# Patient Record
Sex: Male | Born: 1960
Health system: Southern US, Community
[De-identification: ages and names within clinical notes are randomized; demographics above are authoritative.]

## PROBLEM LIST (undated history)

## (undated) DIAGNOSIS — M1990 Unspecified osteoarthritis, unspecified site: Secondary | ICD-10-CM

## (undated) DIAGNOSIS — E663 Overweight: Secondary | ICD-10-CM

## (undated) DIAGNOSIS — D179 Benign lipomatous neoplasm, unspecified: Secondary | ICD-10-CM

## (undated) DIAGNOSIS — F1020 Alcohol dependence, uncomplicated: Secondary | ICD-10-CM

## (undated) DIAGNOSIS — R7303 Prediabetes: Secondary | ICD-10-CM

## (undated) DIAGNOSIS — J449 Chronic obstructive pulmonary disease, unspecified: Secondary | ICD-10-CM

## (undated) DIAGNOSIS — K649 Unspecified hemorrhoids: Secondary | ICD-10-CM

## (undated) DIAGNOSIS — F1490 Cocaine use, unspecified, uncomplicated: Secondary | ICD-10-CM

## (undated) DIAGNOSIS — K219 Gastro-esophageal reflux disease without esophagitis: Secondary | ICD-10-CM

## (undated) DIAGNOSIS — F17210 Nicotine dependence, cigarettes, uncomplicated: Secondary | ICD-10-CM

## (undated) DIAGNOSIS — I700 Atherosclerosis of aorta: Secondary | ICD-10-CM

## (undated) DIAGNOSIS — K862 Cyst of pancreas: Secondary | ICD-10-CM

## (undated) DIAGNOSIS — R918 Other nonspecific abnormal finding of lung field: Secondary | ICD-10-CM

## (undated) DIAGNOSIS — K7689 Other specified diseases of liver: Secondary | ICD-10-CM

## (undated) DIAGNOSIS — I10 Essential (primary) hypertension: Secondary | ICD-10-CM

## (undated) DIAGNOSIS — J189 Pneumonia, unspecified organism: Secondary | ICD-10-CM

## (undated) DIAGNOSIS — I2510 Atherosclerotic heart disease of native coronary artery without angina pectoris: Secondary | ICD-10-CM

## (undated) HISTORY — DX: Essential (primary) hypertension: I10

## (undated) HISTORY — DX: Benign lipomatous neoplasm, unspecified: D17.9

## (undated) HISTORY — DX: Prediabetes: R73.03

## (undated) HISTORY — DX: Cyst of pancreas: K86.2

## (undated) HISTORY — DX: Overweight: E66.3

## (undated) HISTORY — DX: Unspecified hemorrhoids: K64.9

## (undated) HISTORY — DX: Nicotine dependence, cigarettes, uncomplicated: F17.210

## (undated) HISTORY — DX: Other nonspecific abnormal finding of lung field: R91.8

## (undated) HISTORY — DX: Atherosclerotic heart disease of native coronary artery without angina pectoris: I25.10

## (undated) HISTORY — PX: COLONOSCOPY: SHX174

## (undated) HISTORY — DX: Unspecified osteoarthritis, unspecified site: M19.90

## (undated) HISTORY — DX: Atherosclerosis of aorta: I70.0

## (undated) HISTORY — DX: Cocaine use, unspecified, uncomplicated: F14.90

## (undated) HISTORY — DX: Alcohol dependence, uncomplicated: F10.20

## (undated) HISTORY — DX: Other specified diseases of liver: K76.89

## (undated) HISTORY — PX: JOINT REPLACEMENT: SHX530

---

## 2009-11-24 HISTORY — PX: APPENDECTOMY: SHX54

## 2011-12-08 DIAGNOSIS — G8929 Other chronic pain: Secondary | ICD-10-CM | POA: Diagnosis not present

## 2011-12-08 DIAGNOSIS — M79609 Pain in unspecified limb: Secondary | ICD-10-CM | POA: Diagnosis not present

## 2011-12-08 DIAGNOSIS — I10 Essential (primary) hypertension: Secondary | ICD-10-CM | POA: Diagnosis not present

## 2012-01-23 DIAGNOSIS — R51 Headache: Secondary | ICD-10-CM | POA: Diagnosis not present

## 2012-01-23 DIAGNOSIS — J3489 Other specified disorders of nose and nasal sinuses: Secondary | ICD-10-CM | POA: Diagnosis not present

## 2012-01-23 DIAGNOSIS — M542 Cervicalgia: Secondary | ICD-10-CM | POA: Diagnosis not present

## 2012-01-23 DIAGNOSIS — S199XXA Unspecified injury of neck, initial encounter: Secondary | ICD-10-CM | POA: Diagnosis not present

## 2012-01-23 DIAGNOSIS — R42 Dizziness and giddiness: Secondary | ICD-10-CM | POA: Diagnosis not present

## 2012-01-23 DIAGNOSIS — R079 Chest pain, unspecified: Secondary | ICD-10-CM | POA: Diagnosis not present

## 2012-01-23 DIAGNOSIS — S0990XA Unspecified injury of head, initial encounter: Secondary | ICD-10-CM | POA: Diagnosis not present

## 2012-01-23 DIAGNOSIS — S0993XA Unspecified injury of face, initial encounter: Secondary | ICD-10-CM | POA: Diagnosis not present

## 2012-08-27 DIAGNOSIS — R079 Chest pain, unspecified: Secondary | ICD-10-CM | POA: Diagnosis not present

## 2012-08-27 DIAGNOSIS — K625 Hemorrhage of anus and rectum: Secondary | ICD-10-CM | POA: Diagnosis not present

## 2012-08-27 DIAGNOSIS — R109 Unspecified abdominal pain: Secondary | ICD-10-CM | POA: Diagnosis not present

## 2012-08-27 DIAGNOSIS — I700 Atherosclerosis of aorta: Secondary | ICD-10-CM | POA: Diagnosis not present

## 2012-08-27 DIAGNOSIS — I4949 Other premature depolarization: Secondary | ICD-10-CM | POA: Diagnosis not present

## 2012-08-27 DIAGNOSIS — R0789 Other chest pain: Secondary | ICD-10-CM | POA: Diagnosis not present

## 2012-08-27 DIAGNOSIS — R1013 Epigastric pain: Secondary | ICD-10-CM | POA: Diagnosis not present

## 2012-08-27 DIAGNOSIS — K921 Melena: Secondary | ICD-10-CM | POA: Diagnosis not present

## 2012-08-28 DIAGNOSIS — K625 Hemorrhage of anus and rectum: Secondary | ICD-10-CM | POA: Diagnosis not present

## 2012-09-01 DIAGNOSIS — K219 Gastro-esophageal reflux disease without esophagitis: Secondary | ICD-10-CM | POA: Diagnosis not present

## 2012-09-01 DIAGNOSIS — F17200 Nicotine dependence, unspecified, uncomplicated: Secondary | ICD-10-CM | POA: Diagnosis not present

## 2012-09-01 DIAGNOSIS — I10 Essential (primary) hypertension: Secondary | ICD-10-CM | POA: Diagnosis not present

## 2012-09-01 DIAGNOSIS — R1013 Epigastric pain: Secondary | ICD-10-CM | POA: Diagnosis not present

## 2012-09-08 DIAGNOSIS — D129 Benign neoplasm of anus and anal canal: Secondary | ICD-10-CM | POA: Diagnosis not present

## 2012-09-08 DIAGNOSIS — Z1211 Encounter for screening for malignant neoplasm of colon: Secondary | ICD-10-CM | POA: Diagnosis not present

## 2012-09-08 DIAGNOSIS — D128 Benign neoplasm of rectum: Secondary | ICD-10-CM | POA: Diagnosis not present

## 2012-09-08 DIAGNOSIS — K319 Disease of stomach and duodenum, unspecified: Secondary | ICD-10-CM | POA: Diagnosis not present

## 2014-12-05 ENCOUNTER — Encounter: Payer: Self-pay | Admitting: Internal Medicine

## 2014-12-05 DIAGNOSIS — K279 Peptic ulcer, site unspecified, unspecified as acute or chronic, without hemorrhage or perforation: Secondary | ICD-10-CM | POA: Diagnosis not present

## 2014-12-07 DIAGNOSIS — I10 Essential (primary) hypertension: Secondary | ICD-10-CM | POA: Diagnosis not present

## 2014-12-13 ENCOUNTER — Encounter: Payer: Self-pay | Admitting: *Deleted

## 2014-12-27 ENCOUNTER — Ambulatory Visit: Payer: Self-pay | Admitting: Nurse Practitioner

## 2014-12-29 ENCOUNTER — Emergency Department (HOSPITAL_COMMUNITY): Payer: Medicare Other

## 2014-12-29 ENCOUNTER — Encounter (HOSPITAL_COMMUNITY): Payer: Self-pay | Admitting: *Deleted

## 2014-12-29 ENCOUNTER — Emergency Department (HOSPITAL_COMMUNITY)
Admission: EM | Admit: 2014-12-29 | Discharge: 2014-12-29 | Disposition: A | Discharge status: Home / Self Care | Payer: Medicare Other | Attending: Emergency Medicine | Admitting: Emergency Medicine

## 2014-12-29 DIAGNOSIS — R109 Unspecified abdominal pain: Secondary | ICD-10-CM | POA: Insufficient documentation

## 2014-12-29 DIAGNOSIS — Z79899 Other long term (current) drug therapy: Secondary | ICD-10-CM | POA: Insufficient documentation

## 2014-12-29 DIAGNOSIS — R531 Weakness: Secondary | ICD-10-CM | POA: Insufficient documentation

## 2014-12-29 DIAGNOSIS — I10 Essential (primary) hypertension: Secondary | ICD-10-CM | POA: Insufficient documentation

## 2014-12-29 DIAGNOSIS — K7689 Other specified diseases of liver: Secondary | ICD-10-CM | POA: Diagnosis not present

## 2014-12-29 DIAGNOSIS — R0602 Shortness of breath: Secondary | ICD-10-CM | POA: Diagnosis not present

## 2014-12-29 DIAGNOSIS — R197 Diarrhea, unspecified: Secondary | ICD-10-CM | POA: Diagnosis not present

## 2014-12-29 DIAGNOSIS — Z720 Tobacco use: Secondary | ICD-10-CM | POA: Insufficient documentation

## 2014-12-29 DIAGNOSIS — M2550 Pain in unspecified joint: Secondary | ICD-10-CM | POA: Diagnosis present

## 2014-12-29 DIAGNOSIS — K921 Melena: Secondary | ICD-10-CM | POA: Diagnosis not present

## 2014-12-29 DIAGNOSIS — K868 Other specified diseases of pancreas: Secondary | ICD-10-CM | POA: Diagnosis not present

## 2014-12-29 LAB — BASIC METABOLIC PANEL
Anion gap: 4 — ABNORMAL LOW (ref 5–15)
BUN: 11 mg/dL (ref 6–23)
CALCIUM: 9.3 mg/dL (ref 8.4–10.5)
CO2: 29 mmol/L (ref 19–32)
Chloride: 104 mmol/L (ref 96–112)
Creatinine, Ser: 0.89 mg/dL (ref 0.50–1.35)
GFR calc non Af Amer: >90 mL/min (ref >90)
GLUCOSE: 105 mg/dL — AB (ref 70–99)
Potassium: 4.1 mmol/L (ref 3.5–5.1)
Sodium: 137 mmol/L (ref 135–145)

## 2014-12-29 LAB — HEPATIC FUNCTION PANEL
ALT: 27 U/L (ref 0–53)
AST: 38 U/L — ABNORMAL HIGH (ref 0–37)
Albumin: 4 g/dL (ref 3.5–5.2)
Alkaline Phosphatase: 62 U/L (ref 39–117)
Bilirubin, Direct: 0.1 mg/dL (ref 0.0–0.5)
Indirect Bilirubin: 0.6 mg/dL (ref 0.3–0.9)
TOTAL PROTEIN: 7 g/dL (ref 6.0–8.3)
Total Bilirubin: 0.7 mg/dL (ref 0.3–1.2)

## 2014-12-29 LAB — CBC WITH DIFFERENTIAL/PLATELET
BASOS PCT: 0 % (ref 0–1)
Basophils Absolute: 0 10*3/uL (ref 0.0–0.1)
Eosinophils Absolute: 0.1 10*3/uL (ref 0.0–0.7)
Eosinophils Relative: 1 % (ref 0–5)
HCT: 40.3 % (ref 39.0–52.0)
HEMOGLOBIN: 13.3 g/dL (ref 13.0–17.0)
LYMPHS PCT: 30 % (ref 12–46)
Lymphs Abs: 2 10*3/uL (ref 0.7–4.0)
MCH: 31.5 pg (ref 26.0–34.0)
MCHC: 33 g/dL (ref 30.0–36.0)
MCV: 95.5 fL (ref 78.0–100.0)
MONOS PCT: 10 % (ref 3–12)
Monocytes Absolute: 0.7 10*3/uL (ref 0.1–1.0)
Neutro Abs: 3.8 10*3/uL (ref 1.7–7.7)
Neutrophils Relative %: 58 % (ref 43–77)
PLATELETS: 285 10*3/uL (ref 150–400)
RBC: 4.22 MIL/uL (ref 4.22–5.81)
RDW: 14 % (ref 11.5–15.5)
WBC: 6.4 10*3/uL (ref 4.0–10.5)

## 2014-12-29 LAB — LIPASE, BLOOD: Lipase: 26 U/L (ref 11–59)

## 2014-12-29 MED ORDER — PANTOPRAZOLE SODIUM 40 MG IV SOLR
40.0000 mg | Freq: Once | INTRAVENOUS | Status: AC
  Administered 2014-12-29: 40 mg via INTRAVENOUS
  Filled 2014-12-29: qty 40

## 2014-12-29 MED ORDER — HYDROMORPHONE HCL 1 MG/ML IJ SOLN
1.0000 mg | Freq: Once | INTRAMUSCULAR | Status: AC
  Administered 2014-12-29: 1 mg via INTRAVENOUS
  Filled 2014-12-29: qty 1

## 2014-12-29 MED ORDER — SODIUM CHLORIDE 0.9 % IV BOLUS (SEPSIS)
1000.0000 mL | Freq: Once | INTRAVENOUS | Status: AC
  Administered 2014-12-29: 1000 mL via INTRAVENOUS

## 2014-12-29 MED ORDER — SODIUM CHLORIDE 0.9 % IV SOLN: INTRAVENOUS | Status: DC

## 2014-12-29 MED ORDER — SODIUM CHLORIDE 0.9 % IJ SOLN
INTRAMUSCULAR | Status: AC
  Filled 2014-12-29: qty 45

## 2014-12-29 MED ORDER — IOHEXOL 300 MG/ML  SOLN
100.0000 mL | Freq: Once | INTRAMUSCULAR | Status: AC | PRN
  Administered 2014-12-29: 100 mL via INTRAVENOUS

## 2014-12-29 MED ORDER — HYDROCODONE-ACETAMINOPHEN 5-325 MG PO TABS: 1.0000 | ORAL_TABLET | Freq: Four times a day (QID) | ORAL | Status: DC | PRN

## 2014-12-29 MED ORDER — IOHEXOL 300 MG/ML  SOLN
25.0000 mL | Freq: Once | INTRAMUSCULAR | Status: AC | PRN
  Administered 2014-12-29: 25 mL via ORAL

## 2014-12-29 MED ORDER — ONDANSETRON HCL 4 MG/2ML IJ SOLN
4.0000 mg | Freq: Once | INTRAMUSCULAR | Status: AC
  Administered 2014-12-29: 4 mg via INTRAVENOUS
  Filled 2014-12-29: qty 2

## 2014-12-29 NOTE — ED Notes (Signed)
MD at bedside. 

## 2014-12-29 NOTE — ED Notes (Addendum)
Pt states he woke up 2 days ago with joint pain mostly to right side of body, states pain to almost entire right side of the body. Pt also states bloody stools at times, which he would like to be checked for also. Last noticed bloody stool last Tuesday. Denies abdominal pain. States scratchy throat x 1 week.

## 2014-12-29 NOTE — Discharge Instructions (Signed)
Based on the CT findings that we discussed. With concerns for possible pancreatic mass or liver mass, although these may be nothing of significance, it is important that she get prompt follow-up. Call your primary care doctor and get an appointment sooner than later. Contact GI medicine for evaluation. They may recommend MRI around the liver and the pancreas. Also for the blood in the bowel movements colonoscopy would be appropriate. Also with rest of workup is negative upper endoscopy to rule out the stomach ulcer would also be appropriate. Take pain medicine as directed. Work note provided. Return for new or worse symptoms.

## 2014-12-29 NOTE — ED Provider Notes (Signed)
CSN: [id number]     Arrival date & time 12/29/14  0753 History   First MD Initiated Contact with Patient 12/29/14 1305     Chief Complaint  Patient presents with  . Joint Pain     (Consider location/radiation/quality/duration/timing/severity/associated sxs/prior Treatment) The history is provided by the patient.   patient here for several complaints. Patient woke 2 days ago with joint pain mostly right side of the body. Also with some mild upper respiratory symptoms. Patient has had abdominal pain for 2 weeks. Seen in urgent care started on Prilosec over-the-counter. Patient's also had episodes of the bloody bowel movements. Had bowel movement since then the last bloody one was Tuesday. Nursing stated that he he denied abdominal pain. Was the first thing he told me about. Patient states abdominal pain is 8 out of 10 nonradiating not associated with nausea or vomiting.     Past Medical History  Diagnosis Date  . Hypertension    History reviewed. No pertinent past surgical history. Family History  Problem Relation Age of Onset  . Arthritis Mother   . Cancer Mother   . Diabetes Mother   . Hypertension Mother   . Arthritis Sister   . Cancer Sister   . COPD Brother   . Arthritis Sister   . Arthritis Sister   . Arthritis Sister    History  Substance Use Topics  . Smoking status: Current Every Day Smoker  . Smokeless tobacco: Never Used  . Alcohol Use: Yes    Review of Systems  Constitutional: Positive for fatigue. Negative for fever.  HENT: Positive for congestion and sore throat. Negative for trouble swallowing and voice change.   Eyes: Negative for redness.  Respiratory: Negative for shortness of breath.   Cardiovascular: Negative for chest pain and leg swelling.  Gastrointestinal: Positive for abdominal pain and blood in stool. Negative for nausea, vomiting and diarrhea.  Genitourinary: Negative for dysuria.  Musculoskeletal: Positive for myalgias. Negative for neck  stiffness.  Skin: Negative for rash.  Neurological: Positive for weakness. Negative for speech difficulty, numbness and headaches.  Hematological: Does not bruise/bleed easily.  Psychiatric/Behavioral: Negative for confusion.      Allergies  Review of patient's allergies indicates no known allergies.  Home Medications   Prior to Admission medications   Medication Sig Start Date End Date Taking? Authorizing Provider  acetaminophen (TYLENOL) 500 MG tablet Take 1,000 mg by mouth every 6 (six) hours as needed for moderate pain.   Yes Historical Provider, MD  lisinopril (PRINIVIL,ZESTRIL) 20 MG tablet Take 20 mg by mouth daily.   Yes Historical Provider, MD  omeprazole (PRILOSEC OTC) 20 MG tablet Take 20 mg by mouth daily.   Yes Historical Provider, MD   BP 165/99 mmHg  Pulse 50  Temp(Src) 97.9 F (36.6 C) (Oral)  Resp 16  Ht 5\' 8"  (1.727 m)  Wt 179 lb (81.194 kg)  BMI 27.22 kg/m2  SpO2 98% Physical Exam  Constitutional: He is oriented to person, place, and time. He appears well-developed and well-nourished. No distress.  HENT:  Head: Normocephalic and atraumatic.  Mouth/Throat: Oropharynx is clear and moist. No oropharyngeal exudate.  Eyes: Conjunctivae and EOM are normal. Pupils are equal, round, and reactive to light.  Neck: Normal range of motion.  Cardiovascular: Normal rate, regular rhythm and normal heart sounds.   No murmur heard. Pulmonary/Chest: Effort normal and breath sounds normal. No respiratory distress.  Abdominal: Soft. Bowel sounds are normal. He exhibits no mass. There is tenderness. There is  no guarding.  Musculoskeletal: Normal range of motion. He exhibits no edema.  Neurological: He is alert and oriented to person, place, and time. No cranial nerve deficit. He exhibits normal muscle tone. Coordination normal.  Skin: Skin is warm. No rash noted.  Nursing note and vitals reviewed.   ED Course  Procedures (including critical care time) Labs Review Labs  Reviewed  BASIC METABOLIC PANEL - Abnormal; Notable for the following:    Glucose, Bld 105 (*)    Anion gap 4 (*)    All other components within normal limits  HEPATIC FUNCTION PANEL - Abnormal; Notable for the following:    AST 38 (*)    All other components within normal limits  CBC WITH DIFFERENTIAL/PLATELET  LIPASE, BLOOD   Results for orders placed or performed during the hospital encounter of 16/01/09  Basic metabolic panel  Result Value Ref Range   Sodium 137 135 - 145 mmol/L   Potassium 4.1 3.5 - 5.1 mmol/L   Chloride 104 96 - 112 mmol/L   CO2 29 19 - 32 mmol/L   Glucose, Bld 105 (H) 70 - 99 mg/dL   BUN 11 6 - 23 mg/dL   Creatinine, Ser 0.89 0.50 - 1.35 mg/dL   Calcium 9.3 8.4 - 10.5 mg/dL   GFR calc non Af Amer >90 >90 mL/min   GFR calc Af Amer >90 >90 mL/min   Anion gap 4 (L) 5 - 15  CBC with Differential  Result Value Ref Range   WBC 6.4 4.0 - 10.5 K/uL   RBC 4.22 4.22 - 5.81 MIL/uL   Hemoglobin 13.3 13.0 - 17.0 g/dL   HCT 40.3 39.0 - 52.0 %   MCV 95.5 78.0 - 100.0 fL   MCH 31.5 26.0 - 34.0 pg   MCHC 33.0 30.0 - 36.0 g/dL   RDW 14.0 11.5 - 15.5 %   Platelets 285 150 - 400 K/uL   Neutrophils Relative % 58 43 - 77 %   Neutro Abs 3.8 1.7 - 7.7 K/uL   Lymphocytes Relative 30 12 - 46 %   Lymphs Abs 2.0 0.7 - 4.0 K/uL   Monocytes Relative 10 3 - 12 %   Monocytes Absolute 0.7 0.1 - 1.0 K/uL   Eosinophils Relative 1 0 - 5 %   Eosinophils Absolute 0.1 0.0 - 0.7 K/uL   Basophils Relative 0 0 - 1 %   Basophils Absolute 0.0 0.0 - 0.1 K/uL  Lipase, blood  Result Value Ref Range   Lipase 26 11 - 59 U/L  Hepatic function panel  Result Value Ref Range   Total Protein 7.0 6.0 - 8.3 g/dL   Albumin 4.0 3.5 - 5.2 g/dL   AST 38 (H) 0 - 37 U/L   ALT 27 0 - 53 U/L   Alkaline Phosphatase 62 39 - 117 U/L   Total Bilirubin 0.7 0.3 - 1.2 mg/dL   Bilirubin, Direct 0.1 0.0 - 0.5 mg/dL   Indirect Bilirubin 0.6 0.3 - 0.9 mg/dL     Imaging Review Dg Chest 2 View  12/29/2014    CLINICAL DATA:  Right-sided pain from the shoulder down. Shortness of breath. Abdominal pain.  EXAM: CHEST  2 VIEW  COMPARISON:  None.  FINDINGS: The heart size and mediastinal contours are within normal limits. Both lungs are clear. The visualized skeletal structures are unremarkable.  IMPRESSION: No active cardiopulmonary disease.   Electronically Signed   By: Kathreen Devoid   On: 12/29/2014 14:46   Ct Abdomen  Pelvis W Contrast  12/29/2014   CLINICAL DATA:  Right-sided abdominal pain, diarrhea  EXAM: CT ABDOMEN AND PELVIS WITH CONTRAST  TECHNIQUE: Multidetector CT imaging of the abdomen and pelvis was performed using the standard protocol following bolus administration of intravenous contrast.  CONTRAST:  44mL OMNIPAQUE IOHEXOL 300 MG/ML SOLN, 157mL OMNIPAQUE IOHEXOL 300 MG/ML SOLN  COMPARISON:  None.  FINDINGS: Lower chest: Minimal dependent bibasilar atelectasis is present. No pleural effusion.  Hepatobiliary: A homogeneously enhancing 1.8 cm oval mass is identified in the posterior segment right hepatic lobe image 40 series 2. At delayed imaging, there is apparent deenhancement. No other hepatic mass is identified. No intrahepatic ductal dilatation. Gallbladder is normal.  Pancreas: Within the body of the pancreas, there is a 5 mm hypodense lesion not in apparent immediate contiguity with the main pancreatic duct, image 25 series 2. No peripancreatic fluid collection is identified and the pancreas is otherwise homogeneously enhancing. No enhancing mass lesion is identified.  Spleen: Normal  Adrenals/Urinary Tract: Adrenal glands are normal. Kidneys are normal.  Stomach/Bowel: Right lower quadrant clips most likely indicate appendectomy. Stomach is normal in appearance. No bowel wall thickening or focal segmental dilatation is identified.  Vascular/Lymphatic: No lymphadenopathy. Moderate atheromatous aortic calcification is identified without aneurysm. A few small retroperitoneal nodes are identified measuring  5 mm and smaller in short axis.  Other: Small fat containing bilateral inguinal hernias. No acute osseous abnormality.  Musculoskeletal: No acute osseous abnormality.  IMPRESSION: No acute intra-abdominal or pelvic pathology.  Enhancing posterior segment right hepatic lobe mass with subsequent de enhancement, which could indicate hemangioma however solitary metastatic lesion could have a similar appearance. Further differentiation is recommended with abdominal MRI with contrast, preferably hepatocyte specific imaging contrast agent Eovist.  5 mm low-density pancreatic body mass. If the patient has had a history of pancreatitis, this could represent sequela of previous inflammation, a congenital cyst, or less likely cystadenoma. This is also amenable to further evaluation at the time of hepatic MRI for differentiation from malignancy.  These results will be called to the ordering clinician or representative by the Radiologist Assistant, and communication documented in the PACS or zVision Dashboard.   Electronically Signed   By: Conchita Paris M.D.   On: 12/29/2014 15:23     EKG Interpretation None      MDM   Final diagnoses:  Abdominal pain  Weakness  Blood in stool    Patient with abdominal pain predominantly epigastric pain for several weeks. Patient seen in urgent care and started on Prilosec OTC. Patient with history of hypertension. Do not take his hypertensive meds this morning but did take them here and blood pressure is showing some signs of improvement. Patient has a new doctor Dr. Buelah Manis.  Lab workup without any significant abnormalities no evidence of anemia no leukocytosis or function test are normal no elevated lipase nothing consistent with pancreatitis. Patient also had some blood in his bowel movements over the past week. Sometimes he states a large amount but not with every bowel movement. None today. No evidence of significant blood loss.  CT scan of the abdomen is pending if  negative patient to be discharged home with follow-up with GI medicine and his regular doctor for the blood in the bowel movements it would be appropriate to arrange a colonoscopy. For the epigastric abdominal pain of consideration for upper endoscopy would be appropriate as well.   For the patient's generalized fatigue and weakness and pain on the left side lab workup  chest x-ray without any significant findings to explain that. Patient's vital signs no fever no tachycardia.   Addendum: CT scan raises concerns for possible pancreatic mass or liver mass. Follow-up with GI medicine very important based on this finding. Will give patient referral. Will treat the pain. Body ache cause is not clear may just very well be related to a viral type illness. Patient also would be important to follow-up with Dr. Buelah Manis sooner than later.  Fredia Sorrow, MD 12/29/14 1539

## 2014-12-29 NOTE — ED Notes (Signed)
Pt states he did not take his BP medication today.  

## 2015-01-01 ENCOUNTER — Other Ambulatory Visit: Payer: Self-pay

## 2015-01-02 ENCOUNTER — Encounter: Payer: Self-pay | Admitting: Nurse Practitioner

## 2015-01-02 ENCOUNTER — Other Ambulatory Visit: Payer: Self-pay

## 2015-01-02 ENCOUNTER — Ambulatory Visit (INDEPENDENT_AMBULATORY_CARE_PROVIDER_SITE_OTHER): Payer: Medicare Other | Admitting: Nurse Practitioner

## 2015-01-02 VITALS — BP 138/91 | HR 74 | Temp 97.6°F | Ht 68.0 in | Wt 158.4 lb

## 2015-01-02 DIAGNOSIS — R634 Abnormal weight loss: Secondary | ICD-10-CM

## 2015-01-02 DIAGNOSIS — K869 Disease of pancreas, unspecified: Secondary | ICD-10-CM | POA: Diagnosis not present

## 2015-01-02 DIAGNOSIS — R109 Unspecified abdominal pain: Secondary | ICD-10-CM

## 2015-01-02 DIAGNOSIS — R5383 Other fatigue: Secondary | ICD-10-CM | POA: Diagnosis not present

## 2015-01-02 DIAGNOSIS — K625 Hemorrhage of anus and rectum: Secondary | ICD-10-CM

## 2015-01-02 DIAGNOSIS — K8689 Other specified diseases of pancreas: Secondary | ICD-10-CM | POA: Insufficient documentation

## 2015-01-02 DIAGNOSIS — R160 Hepatomegaly, not elsewhere classified: Secondary | ICD-10-CM

## 2015-01-02 DIAGNOSIS — K769 Liver disease, unspecified: Secondary | ICD-10-CM | POA: Diagnosis not present

## 2015-01-02 NOTE — Assessment & Plan Note (Signed)
Intermittent rectal bleeding over the past year. In the past 2-3 weeks rectal bleeding has become more frequent and large. Also with epigastric and RUQ/RLQ abdominal pain, weight loss (per patient and current record as much as 18 pounds in the past month), fatigue, decreased appetite. History of alcohol abuse (currently drinks up to a pint a day, but will sometimes go a week without any ETOH). Was seen in the ER a few days ago with essentially normal labs. CT demonstrated both right hepatic lobe mass and pancreatic body mass concerning for possible carcinoma, recommend MRI follow-up. Will check labs: CBC, BMP, hepatic function panel, lipase. Will order MRI abdomen with contrast for hepatic and pancreatic masses. Will plan for colonoscopy +/- EGD with Dr. Gala Romney in the OR with propofol (due to alcohol/drug use history). Will need drug test prior to procedure for history of cocaine use. Will call with results and make additional plan from there. Return in 2 weeks for follow-up.  Proceed with TCS +/- EGD in the OR with propofol with Dr. Gala Romney in near future: the risks, benefits, and alternatives have been discussed with the patient in detail. The patient states understanding and desires to proceed.  Will need drug test prior to procedure (history of cocaine use.)

## 2015-01-02 NOTE — Progress Notes (Signed)
Primary Care Physician:  Vic Blackbird, MD Primary Gastroenterologist:  Dr. Gala Romney  Chief Complaint  Patient presents with  . Rectal Bleeding  . Fatigue    HPI:   54 year old male presents for ER follow-up. Presented to the ER 12/29/14 with complaints of abdominal pain x 2 weeks. Was seen in urgent care and given Prilosec OTC which has not helped. Also c/o bloody bowel movements over the past week which he states was large in volume, but not with every bowel movement. Also c/o generalized weakness and fatigue. Labs in the ER were done, CBC normal, BMP normal, lipase normal. Hepatic panel showed minimal elevation of AST (38). CT abdomen found 1.8 cm oval mass in posterior segment of the right hepatic lobe, could indicate hemangioma however solitary metastatic lesion could have a similar appearance; 5 mm hypodense lesion within the body of the pancreas without peripancreatic fluid collection which, if the patient has a history of pancreatitis, could represent sequela of previous inflammation, a congenital cyst, or less likely cystadenoma. Radiologist recommended follow-up abdominal MRI with contrast for both issues. Chest XRay with no acute process.  Today the patient states he's been very weak, which started about 2-3 weeks ago but has gotten progressively worse. The rectal bleeding has been occurring on and off for about a year, but has become more consistent in the past week. Had a colonoscopy about 10-15 years ago (per patient) which was done in [place], [place]. Per the patient he had an intestinal infection but no polyps or masses. Abdominal pain is pan right-sided which he though was attributable to arthritis. Denies N/V. Her previous noraml was 1-3 bowel movements a day which were a bit on the loose side. Now he is having less frequent bowel movements and looser. Noted hematochezia, which he states is a large amount, on the tissue, the stool, and in the water. Bleeding has been more frequent since  leaving the ER, last episode today. Has been having fever/chills for the past 3 days. Has had no appetite for the past 1-2 weeks. States a couple weeks ago was 176 lb, today his weight is 158 lb in the office. Also admits some lightheadedness over the past week or two.  Past Medical History  Diagnosis Date  . Hypertension     No past surgical history on file.  Current Outpatient Prescriptions  Medication Sig Dispense Refill  . HYDROcodone-acetaminophen (NORCO/VICODIN) 5-325 MG per tablet Take 1-2 tablets by mouth every 6 (six) hours as needed. 20 tablet 0  . lisinopril (PRINIVIL,ZESTRIL) 20 MG tablet Take 20 mg by mouth daily.    Marland Kitchen omeprazole (PRILOSEC OTC) 20 MG tablet Take 20 mg by mouth daily.    Marland Kitchen acetaminophen (TYLENOL) 500 MG tablet Take 1,000 mg by mouth every 6 (six) hours as needed for moderate pain.     No current facility-administered medications for this visit.    Allergies as of 01/02/2015  . (No Known Allergies)    Family History  Problem Relation Age of Onset  . Arthritis Mother   . Cancer Mother   . Diabetes Mother   . Hypertension Mother   . Arthritis Sister   . Cancer Sister   . COPD Brother   . Arthritis Sister   . Arthritis Sister   . Arthritis Sister     History   Social History  . Marital Status: Single    Spouse Name: N/A    Number of Children: N/A  . Years of Education:  N/A   Occupational History  . Not on file.   Social History Main Topics  . Smoking status: Current Every Day Smoker  . Smokeless tobacco: Never Used  . Alcohol Use: Yes  . Drug Use: No  . Sexual Activity: Yes   Other Topics Concern  . Not on file   Social History Narrative    Review of Systems: Gen: Denies any fever, chills, fatigue, weight loss, lack of appetite.  CV: Denies chest pain, heart palpitations, peripheral edema.  Resp: Denies shortness of breath at rest, occasional shortness of breath on exertion. Denies wheezing.  GI: See HPI. Admits rare dysphagia  about once a month, denies odynophagia. Denies jaundice, hematemesis. MS: Admits joint pain per history of arthritis. Admits muscle weakness per HPI. Denies cramps, or limitation of movement.  Derm: Denies rash, itching, dry skin Psych: Denies depression, anxiety, memory loss, and confusion Heme: Denies bruising, bleeding, and enlarged lymph nodes.  Physical Exam: BP 138/91 mmHg  Pulse 74  Temp(Src) 97.6 F (36.4 C) (Oral)  Ht 5\' 8"  (1.727 m)  Wt 158 lb 6.4 oz (71.85 kg)  BMI 24.09 kg/m2 General:   Appears very fatigued but non-toxic. Sleepy but oriented. Pleasant and cooperative. Well-nourished and well-developed.  Head:  Normocephalic and atraumatic. Eyes:  Without icterus, sclera clear and conjunctiva pink.  Ears:  Normal auditory acuity. Mouth:  No deformity or lesions, oral mucosa pink.  Neck:  Supple, without mass or thyromegaly. Lungs:  Clear to auscultation bilaterally. No wheezes, rales, or rhonchi. No distress.  Heart:  S1, S2 present without murmurs appreciated.  Abdomen:  +BS, soft, non-distended. Moderate to severe TTP of epigastric and RLQ/RUQ. Liver border appreciated 3-4 fingerbreadths below the costal margin. No splenomegaly noted. Some guarding to palpation, no rebound. No masses appreciated.  Rectal:  Deferred  Msk:  Symmetrical without gross deformities. Normal posture. Pulses:  Normal pulses noted. Extremities:  Without clubbing or edema. Neurologic:  Alert and  oriented x4;  grossly normal neurologically. Skin:  Intact without significant lesions or rashes. Cervical Nodes:  No significant cervical adenopathy. Psych:  Alert and cooperative. Normal mood and affect.     01/02/2015 2:29 PM

## 2015-01-02 NOTE — Patient Instructions (Signed)
1. We will send in the order for the MRI and the nurse will discuss how/when to have this done. 2. Please have your labs drawn roday 3. We will have you sign a records release to try and get your previous colonoscopy records from [facility] in [place], [place] 4. We will schedule your procedure for you (colonoscopy with a possible endoscopy at the same time.) 5. Work on cutting back/quitting both smoking and alcohol use. 6. We will call you when we have results back.

## 2015-01-02 NOTE — Addendum Note (Signed)
Addended by: Marlou Porch on: 01/02/2015 03:26 PM   Modules accepted: Orders

## 2015-01-02 NOTE — Progress Notes (Signed)
cc'ed to pcp °

## 2015-01-03 ENCOUNTER — Other Ambulatory Visit (HOSPITAL_COMMUNITY): Payer: Medicare Other

## 2015-01-03 ENCOUNTER — Ambulatory Visit (HOSPITAL_COMMUNITY)
Admission: RE | Admit: 2015-01-03 | Discharge: 2015-01-03 | Disposition: A | Discharge status: Home / Self Care | Payer: Medicare Other | Source: Ambulatory Visit | Attending: Nurse Practitioner | Admitting: Nurse Practitioner

## 2015-01-03 ENCOUNTER — Other Ambulatory Visit: Payer: Self-pay

## 2015-01-03 ENCOUNTER — Ambulatory Visit (HOSPITAL_COMMUNITY): Admission: RE | Admit: 2015-01-03 | Payer: Medicare Other | Source: Ambulatory Visit

## 2015-01-03 DIAGNOSIS — K7689 Other specified diseases of liver: Secondary | ICD-10-CM | POA: Diagnosis not present

## 2015-01-03 DIAGNOSIS — R109 Unspecified abdominal pain: Secondary | ICD-10-CM | POA: Insufficient documentation

## 2015-01-03 DIAGNOSIS — K8689 Other specified diseases of pancreas: Secondary | ICD-10-CM

## 2015-01-03 DIAGNOSIS — R932 Abnormal findings on diagnostic imaging of liver and biliary tract: Secondary | ICD-10-CM | POA: Diagnosis not present

## 2015-01-03 LAB — CBC WITH DIFFERENTIAL/PLATELET
BASOS ABS: 0 10*3/uL (ref 0.0–0.1)
BASOS PCT: 0 % (ref 0–1)
EOS ABS: 0.1 10*3/uL (ref 0.0–0.7)
EOS PCT: 1 % (ref 0–5)
HCT: 44.7 % (ref 39.0–52.0)
HEMOGLOBIN: 14.8 g/dL (ref 13.0–17.0)
Lymphocytes Relative: 32 % (ref 12–46)
Lymphs Abs: 2.1 10*3/uL (ref 0.7–4.0)
MCH: 31.1 pg (ref 26.0–34.0)
MCHC: 33.1 g/dL (ref 30.0–36.0)
MCV: 93.9 fL (ref 78.0–100.0)
MONO ABS: 0.5 10*3/uL (ref 0.1–1.0)
MPV: 9.8 fL (ref 8.6–12.4)
Monocytes Relative: 8 % (ref 3–12)
NEUTROS ABS: 3.8 10*3/uL (ref 1.7–7.7)
NEUTROS PCT: 59 % (ref 43–77)
Platelets: 304 10*3/uL (ref 150–400)
RBC: 4.76 MIL/uL (ref 4.22–5.81)
RDW: 14.3 % (ref 11.5–15.5)
WBC: 6.5 10*3/uL (ref 4.0–10.5)

## 2015-01-03 LAB — LIPASE: Lipase: 31 U/L (ref 0–75)

## 2015-01-03 LAB — BASIC METABOLIC PANEL
BUN: 13 mg/dL (ref 6–23)
CHLORIDE: 101 meq/L (ref 96–112)
CO2: 28 mEq/L (ref 19–32)
Calcium: 10 mg/dL (ref 8.4–10.5)
Creat: 0.91 mg/dL (ref 0.50–1.35)
GLUCOSE: 86 mg/dL (ref 70–99)
POTASSIUM: 4.9 meq/L (ref 3.5–5.3)
SODIUM: 137 meq/L (ref 135–145)

## 2015-01-03 LAB — HEPATIC FUNCTION PANEL
ALK PHOS: 67 U/L (ref 39–117)
ALT: 20 U/L (ref 0–53)
AST: 24 U/L (ref 0–37)
Albumin: 4.4 g/dL (ref 3.5–5.2)
Bilirubin, Direct: 0.1 mg/dL (ref 0.0–0.3)
Indirect Bilirubin: 0.6 mg/dL (ref 0.2–1.2)
TOTAL PROTEIN: 7.3 g/dL (ref 6.0–8.3)
Total Bilirubin: 0.7 mg/dL (ref 0.2–1.2)

## 2015-01-03 MED ORDER — GADOXETATE DISODIUM 0.25 MMOL/ML IV SOLN
8.0000 mL | Freq: Once | INTRAVENOUS | Status: AC | PRN
  Administered 2015-01-03: 8 mL via INTRAVENOUS

## 2015-01-04 ENCOUNTER — Ambulatory Visit: Payer: Medicare Other | Admitting: Gastroenterology

## 2015-01-08 ENCOUNTER — Ambulatory Visit: Payer: Medicare Other | Admitting: Family Medicine

## 2015-01-09 ENCOUNTER — Other Ambulatory Visit: Payer: Self-pay

## 2015-01-09 ENCOUNTER — Ambulatory Visit (INDEPENDENT_AMBULATORY_CARE_PROVIDER_SITE_OTHER): Payer: Medicare Other | Admitting: Family Medicine

## 2015-01-09 ENCOUNTER — Encounter: Payer: Self-pay | Admitting: Family Medicine

## 2015-01-09 ENCOUNTER — Telehealth: Payer: Self-pay | Admitting: Nurse Practitioner

## 2015-01-09 VITALS — BP 148/88 | HR 72 | Temp 98.2°F | Resp 16 | Ht 69.0 in | Wt 161.0 lb

## 2015-01-09 DIAGNOSIS — R634 Abnormal weight loss: Secondary | ICD-10-CM

## 2015-01-09 DIAGNOSIS — Z125 Encounter for screening for malignant neoplasm of prostate: Secondary | ICD-10-CM

## 2015-01-09 DIAGNOSIS — R5383 Other fatigue: Secondary | ICD-10-CM | POA: Diagnosis not present

## 2015-01-09 DIAGNOSIS — Z720 Tobacco use: Secondary | ICD-10-CM

## 2015-01-09 DIAGNOSIS — F1090 Alcohol use, unspecified, uncomplicated: Secondary | ICD-10-CM

## 2015-01-09 DIAGNOSIS — K2970 Gastritis, unspecified, without bleeding: Secondary | ICD-10-CM | POA: Diagnosis not present

## 2015-01-09 DIAGNOSIS — Z789 Other specified health status: Secondary | ICD-10-CM

## 2015-01-09 DIAGNOSIS — F17200 Nicotine dependence, unspecified, uncomplicated: Secondary | ICD-10-CM

## 2015-01-09 DIAGNOSIS — Z23 Encounter for immunization: Secondary | ICD-10-CM

## 2015-01-09 DIAGNOSIS — I10 Essential (primary) hypertension: Secondary | ICD-10-CM

## 2015-01-09 DIAGNOSIS — M25571 Pain in right ankle and joints of right foot: Secondary | ICD-10-CM

## 2015-01-09 DIAGNOSIS — Z0000 Encounter for general adult medical examination without abnormal findings: Secondary | ICD-10-CM

## 2015-01-09 DIAGNOSIS — G8929 Other chronic pain: Secondary | ICD-10-CM

## 2015-01-09 DIAGNOSIS — M25579 Pain in unspecified ankle and joints of unspecified foot: Secondary | ICD-10-CM

## 2015-01-09 MED ORDER — PEG 3350-KCL-NA BICARB-NACL 420 G PO SOLR: 4000.0000 mL | Freq: Once | ORAL | Status: DC

## 2015-01-09 MED ORDER — PANTOPRAZOLE SODIUM 40 MG PO TBEC: 40.0000 mg | DELAYED_RELEASE_TABLET | Freq: Every day | ORAL | Status: DC

## 2015-01-09 MED ORDER — LISINOPRIL 20 MG PO TABS: 40.0000 mg | ORAL_TABLET | Freq: Every day | ORAL | Status: DC

## 2015-01-09 NOTE — Assessment & Plan Note (Signed)
Pneumonia vaccine given 

## 2015-01-09 NOTE — Progress Notes (Signed)
Patient ID: David Curtis, male   DOB: [birth year], 54 y.o.   MRN: [id number]   Subjective:    Patient ID: David Curtis, male    DOB: [birth year], 54 y.o.   MRN: [id number]  Patient presents for New Patient CPE  patient here for a new patient physical exam. He does not have a primary doctor. He moved here from [place] 2 years ago. He has history of chronic ankle pain and joint pain he was followed by orthopedist Dr. Clayton Lefort in [place] who had him on chronic Percocet per report. He was also seen by gastroenterologist in [place]. He's been having difficulties with his bowels as well as his stomach for quite some time. He is currently on omeprazole over-the-counter which is not helping. He was seen by gastroenterology here locally who are planning for endoscopy and colonoscopy due to some blood in the stools. It was also noted that he went to the ER because of the abdominal pain as well as weakness there was concern for pancreatic mass and liver mass however MRI was done and I see the results today these were benign cyst.  He has history of hypertension he was out of this medication for the past 2 years until recently when the ER gave him a refill on lisinopril. He has no history of any heart disease or stroke per report. He does have history of polysubstance abuse including alcohol which he typically drinks about upon her prednisone half a day he also has used cocaine and marijuana regularly he states that he lasted both of these 30 days ago. He was incarcerated some years ago as well. He denies ever using any IV drugs. He is here today with his girlfriend.  Subjective:   Patient presents for Medicare Annual/Subsequent preventive examination.   Review Past Medical/Family/Social: per EMR   Risk Factors  Current exercise habits: None Dietary issues discussed: None  Cardiac risk factors: HTN  Depression Screen  (Note: if answer to either of the following is "Yes", a more complete  depression screening is indicated)  Over the past two weeks, have you felt down, depressed or hopeless? No Over the past two weeks, have you felt little interest or pleasure in doing things? No Have you lost interest or pleasure in daily life? No Do you often feel hopeless? No Do you cry easily over simple problems? No   Activities of Daily Living  In your present state of health, do you have any difficulty performing the following activities?:  Driving? No  Managing money? No  Feeding yourself? No  Getting from bed to chair? No  Climbing a flight of stairs? No  Preparing food and eating?: No  Bathing or showering? No  Getting dressed: No  Getting to the toilet? No  Using the toilet:No  Moving around from place to place: No  In the past year have you fallen or had a near fall?:No  Are you sexually active? Yes Do you have more than one partner? No   Hearing Difficulties: No  Do you often ask people to speak up or repeat themselves? No  Do you experience ringing or noises in your ears? No Do you have difficulty understanding soft or whispered voices? No  Do you feel that you have a problem with memory? No Do you often misplace items? No  Do you feel safe at home? Yes  Cognitive Testing  Alert? Yes Normal Appearance?Yes  Oriented to person? Yes Place? Yes  Time? Yes  Recall of three  objects? Yes  Can perform simple calculations? Yes  Displays appropriate judgment?Yes  Can read the correct time from a watch face?Yes   List the Names of Other Physician/Practitioners you currently use: Dr. Gala Romney    Screening Tests / Date -No recent immunizations, including pneumonia vaccine   Colonoscopy   - Needs records               Influenza Vaccine  Tetanus/tdap    Assessment:    Annual wellness medicare exam   Plan:    During the course of the visit the patient was educated and counseled about appropriate screening and preventive services including:  Colorectal cancer  screening  Pneumonia vaccine 23 given- smoker   Screen  Neg for depression.   Diet review for nutrition referral? Yes ____ Not Indicated __x__  Patient Instructions (the written plan) was given to the patient.  Medicare Attestation  I have personally reviewed:  The patient's medical and social history  Their use of alcohol, tobacco or illicit drugs  Their current medications and supplements  The patient's functional ability including ADLs,fall risks, home safety risks, cognitive, and hearing and visual impairment  Diet and physical activities  Evidence for depression or mood disorders  The patient's weight, height, BMI, and visual acuity have been recorded in the chart. I have made referrals, counseling, and provided education to the patient based on review of the above and I have provided the patient with a written personalized care plan for preventive services.       Review Of Systems:  GEN- denies fatigue, fever, weight loss,weakness, recent illness HEENT- denies eye drainage, change in vision, nasal discharge, CVS- denies chest pain, palpitations RESP- denies SOB, cough, wheeze ABD- denies N/V, change in stools, +abd pain GU- denies dysuria, hematuria, dribbling, incontinence MSK- + joint pain, muscle aches, injury Neuro- denies headache, dizziness, syncope, seizure activity       Objective:    BP 148/88 mmHg  Pulse 72  Temp(Src) 98.2 F (36.8 C) (Oral)  Resp 16  Ht 5\' 9"  (1.753 m)  Wt 161 lb (73.029 kg)  BMI 23.76 kg/m2 GEN- NAD, alert and oriented x3, thin, HEENT- PERRL, EOMI, non injected sclera, pink conjunctiva, MMM, oropharynx clear Neck- Supple, no thyromegaly CVS- RRR, no murmur RESP-CTAB ABD-NABS,soft,TTP lower quadrants,ND Psych- normal affect and mood,  EXT- No edema Pulses- Radial, DP- 2+        Assessment & Plan:      Problem List Items Addressed This Visit      Unprioritized   Tobacco use disorder   Loss of weight   Relevant Orders    RPR   HIV antibody (with reflex)   Heavy alcohol use   Gastritis - Primary   Fatigue   Relevant Orders   TSH   Essential hypertension   Relevant Medications   lisinopril (PRINIVIL,ZESTRIL) tablet   Other Relevant Orders   Lipid panel    Other Visit Diagnoses    Routine general medical examination at a health care facility        Prostate cancer screening        Relevant Orders    PSA, Medicare    Need for prophylactic vaccination against Streptococcus pneumoniae (pneumococcus)        Relevant Orders    Pneumococcal polysaccharide vaccine 23-valent greater than or equal to 2yo subcutaneous/IM (Completed)       Note: This dictation was prepared with Dragon dictation along with smaller phrase technology. Any transcriptional errors that  result from this process are unintentional.

## 2015-01-09 NOTE — Assessment & Plan Note (Signed)
He states that he was treated for some type of stomach infection the past I symptoms may been H pylori gastroenterology is getting all of his records. I will change him to Protonix 40 mg once a day and he will continue to follow-up get gastroenterology

## 2015-01-09 NOTE — Patient Instructions (Signed)
[facility] [facility] Increase lisinopril to 2 tablet New stomach medication Dexilant Return in 1 week for blood pressure recheck and lab review F/U1 week

## 2015-01-09 NOTE — Assessment & Plan Note (Signed)
Concerned about his ongoing weight loss. He states his typical weight is around 178-180 pounds. I will check some other labs on him today including a thyroid as well as an HIV antibody

## 2015-01-09 NOTE — Assessment & Plan Note (Signed)
Chronic ankle pain stemming from a Worker's Compensation injury back in 1998. I'm concerned about putting him on chronic pain medications because of his substance abuse. I did not give him any prescriptions today I will obtain his records from his previous orthopedist he may need to be sent to a pain clinic

## 2015-01-09 NOTE — Assessment & Plan Note (Signed)
Increase lisinopril to 40 mg follow-up in one week

## 2015-01-09 NOTE — Telephone Encounter (Signed)
Labs normal and MRI both lesions appear benign. Please place on recall list for repeat MRI in 1 year. Also, the plan was for a colonoscopy +/- EGD but it doesn't appear scheduled yet (likely waiting for lab/MRI results). Can we check with the patient and try and schedule it?  Thanks!

## 2015-01-09 NOTE — Assessment & Plan Note (Signed)
He has a heavy alcohol use as well as history of substance abuse. Discussed to him the importance of staying away from these substances. He has quit alcohol completely but this is when he was incarcerated

## 2015-01-10 LAB — LIPID PANEL
CHOL/HDL RATIO: 3.7 ratio
CHOLESTEROL: 223 mg/dL — AB (ref 0–200)
HDL: 60 mg/dL (ref >39)
LDL Cholesterol: 130 mg/dL — ABNORMAL HIGH (ref 0–99)
TRIGLYCERIDES: 166 mg/dL — AB (ref <150)
VLDL: 33 mg/dL (ref 0–40)

## 2015-01-10 LAB — PSA, MEDICARE: PSA: 0.43 ng/mL (ref <=4.00)

## 2015-01-10 LAB — HIV ANTIBODY (ROUTINE TESTING W REFLEX): HIV: NONREACTIVE

## 2015-01-10 LAB — RPR

## 2015-01-10 LAB — TSH: TSH: 1.817 u[IU]/mL (ref 0.350–4.500)

## 2015-01-10 NOTE — Telephone Encounter (Signed)
Pt is scheduled for 01/25/15 @ 11:00 for his TCS+/-EGD. Instructions are in the mail

## 2015-01-18 ENCOUNTER — Telehealth: Payer: Self-pay | Admitting: *Deleted

## 2015-01-18 NOTE — Telephone Encounter (Signed)
Received request from pharmacy for PA on Protonix.   PA to be faxed to office.   Dx: K29.70- Gastritis.

## 2015-01-19 ENCOUNTER — Ambulatory Visit: Payer: Medicare Other | Admitting: Nurse Practitioner

## 2015-01-19 ENCOUNTER — Telehealth: Payer: Self-pay | Admitting: Family Medicine

## 2015-01-19 MED ORDER — DEXLANSOPRAZOLE 60 MG PO CPDR: 60.0000 mg | DELAYED_RELEASE_CAPSULE | Freq: Every day | ORAL | Status: DC

## 2015-01-19 MED ORDER — LISINOPRIL 20 MG PO TABS: 40.0000 mg | ORAL_TABLET | Freq: Every day | ORAL | Status: DC

## 2015-01-19 NOTE — Patient Instructions (Signed)
David Curtis  01/19/2015   Your procedure is scheduled on:  01/25/2015  Report to [facility] at  32  AM.  Call this number if you have problems the morning of surgery: [phone]   Remember:   Do not eat food or drink liquids after midnight.   Take these medicines the morning of surgery with A SIP OF WATER:  Hydrocodone, lisinopril, protonix  Do not wear jewelry, make-up or nail polish.  Do not wear lotions, powders, or perfumes.   Do not shave 48 hours prior to surgery. Men may shave face and neck.  Do not bring valuables to the hospital.  [facility] is not responsible for any belongings or valuables.               Contacts, dentures or bridgework may not be worn into surgery.  Leave suitcase in the car. After surgery it may be brought to your room.  For patients admitted to the hospital, discharge time is determined by your treatment team.               Patients discharged the day of surgery will not be allowed to drive home.  Name and phone number of your driver: family  Special Instructions: N/A   Please read over the following fact sheets that you were given: Pain Booklet, Coughing and Deep Breathing, Surgical Site Infection Prevention, Anesthesia Post-op Instructions and Care and Recovery After Surgery Esophagogastroduodenoscopy Esophagogastroduodenoscopy (EGD) is a procedure to examine the lining of the esophagus, stomach, and first part of the small intestine (duodenum). A long, flexible, lighted tube with a camera attached (endoscope) is inserted down the throat to view these organs. This procedure is done to detect problems or abnormalities, such as inflammation, bleeding, ulcers, or growths, in order to treat them. The procedure lasts about 5-20 minutes. It is usually an outpatient procedure, but it may need to be performed in emergency cases in the hospital. LET YOUR CAREGIVER KNOW ABOUT:   Allergies to food or medicine.  All medicines you are taking,  including vitamins, herbs, eyedrops, and over-the-counter medicines and creams.  Use of steroids (by mouth or creams).  Previous problems you or members of your family have had with the use of anesthetics.  Any blood disorders you have.  Previous surgeries you have had.  Other health problems you have.  Possibility of pregnancy, if this applies. RISKS AND COMPLICATIONS  Generally, EGD is a safe procedure. However, as with any procedure, complications can occur. Possible complications include:  Infection.  Bleeding.  Tearing (perforation) of the esophagus, stomach, or duodenum.  Difficulty breathing or not being able to breath.  Excessive sweating.  Spasms of the larynx.  Slowed heartbeat.  Low blood pressure. BEFORE THE PROCEDURE  Do not eat or drink anything for 6-8 hours before the procedure or as directed by your caregiver.  Ask your caregiver about changing or stopping your regular medicines.  If you wear dentures, be prepared to remove them before the procedure.  Arrange for someone to drive you home after the procedure. PROCEDURE   A vein will be accessed to give medicines and fluids. A medicine to relax you (sedative) and a pain reliever will be given through that access into the vein.  A numbing medicine (local anesthetic) may be sprayed on your throat for comfort and to stop you from gagging or coughing.  A mouth guard may be placed in your mouth to protect your teeth and  to keep you from biting on the endoscope.  You will be asked to lie on your left side.  The endoscope is inserted down your throat and into the esophagus, stomach, and duodenum.  Air is put through the endoscope to allow your caregiver to view the lining of your esophagus clearly.  The esophagus, stomach, and duodenum is then examined. During the exam, your caregiver may:  Remove tissue to be examined under a microscope (biopsy) for inflammation, infection, or other medical  problems.  Remove growths.  Remove objects (foreign bodies) that are stuck.  Treat any bleeding with medicines or other devices that stop tissues from bleeding (hot cautery, clipping devices).  Widen (dilate) or stretch narrowed areas of the esophagus and stomach.  The endoscope will then be withdrawn. AFTER THE PROCEDURE  You will be taken to a recovery area to be monitored. You will be able to go home once you are stable and alert.  Do not eat or drink anything until the local anesthetic and numbing medicines have worn off. You may choke.  It is normal to feel bloated, have pain with swallowing, or have a sore throat for a short time. This will wear off.  Your caregiver should be able to discuss his or her findings with you. It will take longer to discuss the test results if any biopsies were taken. Document Released: 03/13/2005 Document Revised: 03/27/2014 Document Reviewed: 10/13/2012 [facility] Patient Information 2015 [place], [place]. This information is not intended to replace advice given to you by your health care provider. Make sure you discuss any questions you have with your health care provider. Colonoscopy A colonoscopy is an exam to look at the entire large intestine (colon). This exam can help find problems such as tumors, polyps, inflammation, and areas of bleeding. The exam takes about 1 hour.  LET [facility] CARE PROVIDER KNOW ABOUT:   Any allergies you have.  All medicines you are taking, including vitamins, herbs, eye drops, creams, and over-the-counter medicines.  Previous problems you or members of your family have had with the use of anesthetics.  Any blood disorders you have.  Previous surgeries you have had.  Medical conditions you have. RISKS AND COMPLICATIONS  Generally, this is a safe procedure. However, as with any procedure, complications can occur. Possible complications include:  Bleeding.  Tearing or rupture of the colon wall.  Reaction to  medicines given during the exam.  Infection (rare). BEFORE THE PROCEDURE   Ask your health care provider about changing or stopping your regular medicines.  You may be prescribed an oral bowel prep. This involves drinking a large amount of medicated liquid, starting the day before your procedure. The liquid will cause you to have multiple loose stools until your stool is almost clear or light green. This cleans out your colon in preparation for the procedure.  Do not eat or drink anything else once you have started the bowel prep, unless your health care provider tells you it is safe to do so.  Arrange for someone to drive you home after the procedure. PROCEDURE   You will be given medicine to help you relax (sedative).  You will lie on your side with your knees bent.  A long, flexible tube with a light and camera on the end (colonoscope) will be inserted through the rectum and into the colon. The camera sends video back to a computer screen as it moves through the colon. The colonoscope also releases carbon dioxide gas to inflate the  colon. This helps your health care provider see the area better.  During the exam, your health care provider may take a small tissue sample (biopsy) to be examined under a microscope if any abnormalities are found.  The exam is finished when the entire colon has been viewed. AFTER THE PROCEDURE   Do not drive for 24 hours after the exam.  You may have a small amount of blood in your stool.  You may pass moderate amounts of gas and have mild abdominal cramping or bloating. This is caused by the gas used to inflate your colon during the exam.  Ask when your test results will be ready and how you will get your results. Make sure you get your test results. Document Released: 11/07/2000 Document Revised: 08/31/2013 Document Reviewed: 07/18/2013 [facility] Patient Information 2015 [place], [place]. This information is not intended to replace advice given to you  by your health care provider. Make sure you discuss any questions you have with your health care provider. PATIENT INSTRUCTIONS POST-ANESTHESIA  IMMEDIATELY FOLLOWING SURGERY:  Do not drive or operate machinery for the first twenty four hours after surgery.  Do not make any important decisions for twenty four hours after surgery or while taking narcotic pain medications or sedatives.  If you develop intractable nausea and vomiting or a severe headache please notify your doctor immediately.  FOLLOW-UP:  Please make an appointment with your surgeon as instructed. You do not need to follow up with anesthesia unless specifically instructed to do so.  WOUND CARE INSTRUCTIONS (if applicable):  Keep a dry clean dressing on the anesthesia/puncture wound site if there is drainage.  Once the wound has quit draining you may leave it open to air.  Generally you should leave the bandage intact for twenty four hours unless there is drainage.  If the epidural site drains for more than 36-48 hours please call the anesthesia department.  QUESTIONS?:  Please feel free to call your physician or the hospital operator if you have any questions, and they will be happy to assist you.

## 2015-01-19 NOTE — Telephone Encounter (Signed)
Prescription sent to pharmacy.

## 2015-01-19 NOTE — Telephone Encounter (Signed)
Received PA determination.   PA denied.   Dexilant noted on insurance formulary.   MD please advise.

## 2015-01-19 NOTE — Telephone Encounter (Signed)
[phone] PT is needing a refill on his BP medication the lady that called did not know the name of them, he is out and won't be back till Sunday [id number] - [place]

## 2015-01-19 NOTE — Telephone Encounter (Signed)
Changed to dexliant 60mg  once a day

## 2015-01-23 ENCOUNTER — Encounter (HOSPITAL_COMMUNITY)
Admission: RE | Admit: 2015-01-23 | Discharge: 2015-01-23 | Disposition: A | Discharge status: Home / Self Care | Payer: Medicare Other | Source: Ambulatory Visit | Attending: Internal Medicine | Admitting: Internal Medicine

## 2015-01-23 ENCOUNTER — Other Ambulatory Visit: Payer: Self-pay

## 2015-01-23 ENCOUNTER — Ambulatory Visit (INDEPENDENT_AMBULATORY_CARE_PROVIDER_SITE_OTHER): Payer: Medicare Other | Admitting: Family Medicine

## 2015-01-23 ENCOUNTER — Encounter (HOSPITAL_COMMUNITY): Payer: Self-pay

## 2015-01-23 ENCOUNTER — Encounter: Payer: Self-pay | Admitting: Family Medicine

## 2015-01-23 VITALS — BP 150/84 | HR 88 | Temp 97.5°F | Resp 18 | Ht 69.0 in | Wt 159.0 lb

## 2015-01-23 DIAGNOSIS — K869 Disease of pancreas, unspecified: Secondary | ICD-10-CM | POA: Insufficient documentation

## 2015-01-23 DIAGNOSIS — R109 Unspecified abdominal pain: Secondary | ICD-10-CM | POA: Insufficient documentation

## 2015-01-23 DIAGNOSIS — M25571 Pain in right ankle and joints of right foot: Secondary | ICD-10-CM

## 2015-01-23 DIAGNOSIS — K625 Hemorrhage of anus and rectum: Secondary | ICD-10-CM | POA: Diagnosis not present

## 2015-01-23 DIAGNOSIS — G8929 Other chronic pain: Secondary | ICD-10-CM | POA: Diagnosis not present

## 2015-01-23 DIAGNOSIS — I10 Essential (primary) hypertension: Secondary | ICD-10-CM | POA: Diagnosis not present

## 2015-01-23 DIAGNOSIS — R160 Hepatomegaly, not elsewhere classified: Secondary | ICD-10-CM | POA: Diagnosis not present

## 2015-01-23 DIAGNOSIS — Z01818 Encounter for other preprocedural examination: Secondary | ICD-10-CM | POA: Insufficient documentation

## 2015-01-23 DIAGNOSIS — R634 Abnormal weight loss: Secondary | ICD-10-CM | POA: Insufficient documentation

## 2015-01-23 HISTORY — DX: Unspecified osteoarthritis, unspecified site: M19.90

## 2015-01-23 HISTORY — DX: Gastro-esophageal reflux disease without esophagitis: K21.9

## 2015-01-23 MED ORDER — HYDROCODONE-ACETAMINOPHEN 5-325 MG PO TABS: 1.0000 | ORAL_TABLET | Freq: Four times a day (QID) | ORAL | Status: DC | PRN

## 2015-01-23 MED ORDER — LISINOPRIL 40 MG PO TABS: 40.0000 mg | ORAL_TABLET | Freq: Every day | ORAL | Status: DC

## 2015-01-23 MED ORDER — DEXLANSOPRAZOLE 60 MG PO CPDR: 60.0000 mg | DELAYED_RELEASE_CAPSULE | Freq: Every day | ORAL | Status: DC

## 2015-01-23 NOTE — Assessment & Plan Note (Signed)
Increase lisinopril to 40 mg as directed I think his blood pressure will improve.

## 2015-01-23 NOTE — Assessment & Plan Note (Signed)
Chronic ankle pain as still do not have the records. He does seem to be in a lot of pain regarding his foot as well as his abdomen which they are doing procedures on on Thursday. I've given him 30 tablets of pain medicine we also refax all of the release of records today. He states that he typically goes in and has some type of arthroscopic done every couple years because of the calcium buildup in his ankle and he is due to have this. Unfortunately cannot get him in with orthopedic until I have some records from what was done previously

## 2015-01-23 NOTE — Patient Instructions (Signed)
[facility] [facility] [phone] Pain medication prescribed Take the dexilant for your stomach  F/U 2 months for blood pressure

## 2015-01-23 NOTE — Pre-Procedure Instructions (Signed)
Patient given information to sign up for my chart at home. 

## 2015-01-23 NOTE — Progress Notes (Signed)
Patient ID: David Curtis, male   DOB: 01/27/1961, 54 y.o.   MRN: [id number]   Subjective:    Patient ID: David Curtis, male    DOB: 04-Apr-1961, 54 y.o.   MRN: [id number]  Patient presents for F/U  Patient here for interim follow-up on his blood pressure. Her last visit to increase his lisinopril to 40 mg however he only took 20 mg a day. We also not received any records back from his previous surgeons in [place] who is treating him for his chronic ankle pain and leg pain. He continues to have chronic abdominal pain he is scheduled to have his GI procedure this Thursday he had his preop today. He has not picked up his [facility] as his Medicare did not go into effect until today.   Review Of Systems:  GEN- denies fatigue, fever, weight loss,weakness, recent illness HEENT- denies eye drainage, change in vision, nasal discharge, CVS- denies chest pain, palpitations RESP- denies SOB, cough, wheeze ABD- denies N/V, change in stools, +abd pain GU- denies dysuria, hematuria, dribbling, incontinence MSK- + joint pain, muscle aches, injury Neuro- denies headache, dizziness, syncope, seizure activity       Objective:    BP 150/84 mmHg  Pulse 88  Temp(Src) 97.5 F (36.4 C) (Oral)  Resp 18  Ht 5\' 9"  (1.753 m)  Wt 159 lb (72.122 kg)  BMI 23.47 kg/m2 GEN- NAD, alert and oriented x3  CVS- RRR, no murmur RESP-CTAB EXT- No edema MSK- surgical scars noted on right ankle Pulses- Radial 2+        Assessment & Plan:      Problem List Items Addressed This Visit    None      Note: This dictation was prepared with Dragon dictation along with smaller phrase technology. Any transcriptional errors that result from this process are unintentional.

## 2015-01-25 ENCOUNTER — Telehealth: Payer: Self-pay | Admitting: General Practice

## 2015-01-25 ENCOUNTER — Ambulatory Visit (HOSPITAL_COMMUNITY): Payer: Medicare Other | Admitting: Anesthesiology

## 2015-01-25 ENCOUNTER — Ambulatory Visit (HOSPITAL_COMMUNITY)
Admission: RE | Admit: 2015-01-25 | Discharge: 2015-01-25 | Disposition: A | Discharge status: Home / Self Care | Payer: Medicare Other | Source: Ambulatory Visit | Attending: Internal Medicine | Admitting: Internal Medicine

## 2015-01-25 ENCOUNTER — Encounter (HOSPITAL_COMMUNITY)
Admission: RE | Disposition: A | Discharge status: Home / Self Care | Payer: Self-pay | Source: Ambulatory Visit | Attending: Internal Medicine

## 2015-01-25 ENCOUNTER — Encounter (HOSPITAL_COMMUNITY): Payer: Self-pay | Admitting: *Deleted

## 2015-01-25 ENCOUNTER — Encounter: Payer: Self-pay | Admitting: Internal Medicine

## 2015-01-25 DIAGNOSIS — Z538 Procedure and treatment not carried out for other reasons: Secondary | ICD-10-CM | POA: Insufficient documentation

## 2015-01-25 DIAGNOSIS — K625 Hemorrhage of anus and rectum: Secondary | ICD-10-CM | POA: Diagnosis not present

## 2015-01-25 DIAGNOSIS — I10 Essential (primary) hypertension: Secondary | ICD-10-CM | POA: Insufficient documentation

## 2015-01-25 DIAGNOSIS — F17210 Nicotine dependence, cigarettes, uncomplicated: Secondary | ICD-10-CM | POA: Insufficient documentation

## 2015-01-25 DIAGNOSIS — K219 Gastro-esophageal reflux disease without esophagitis: Secondary | ICD-10-CM | POA: Diagnosis not present

## 2015-01-25 LAB — RAPID URINE DRUG SCREEN, HOSP PERFORMED
Amphetamines: NOT DETECTED
Barbiturates: NOT DETECTED
Benzodiazepines: NOT DETECTED
COCAINE: POSITIVE — AB
OPIATES: NOT DETECTED
TETRAHYDROCANNABINOL: NOT DETECTED

## 2015-01-25 SURGERY — CANCELLED PROCEDURE

## 2015-01-25 SURGICAL SUPPLY — 27 items
BLOCK BITE 60FR ADLT L/F BLUE (MISCELLANEOUS) IMPLANT
DEVICE CLIP HEMOSTAT 235CM (CLIP) IMPLANT
ELECT REM PT RETURN 9FT ADLT (ELECTROSURGICAL)
ELECTRODE REM PT RTRN 9FT ADLT (ELECTROSURGICAL) IMPLANT
FCP BXJMBJMB 240X2.8X (CUTTING FORCEPS)
FLOOR PAD 36X40 (MISCELLANEOUS)
FORCEPS BIOP RAD 4 LRG CAP 4 (CUTTING FORCEPS) IMPLANT
FORCEPS BIOP RJ4 240 W/NDL (CUTTING FORCEPS)
FORCEPS BXJMBJMB 240X2.8X (CUTTING FORCEPS) IMPLANT
FORMALIN 10 PREFIL 20ML (MISCELLANEOUS) IMPLANT
INJECTOR/SNARE I SNARE (MISCELLANEOUS) IMPLANT
KIT CLEAN ENDO COMPLIANCE (KITS) ×3 IMPLANT
LUBRICANT JELLY 4.5OZ STERILE (MISCELLANEOUS) IMPLANT
MANIFOLD NEPTUNE II (INSTRUMENTS) IMPLANT
NEEDLE SCLEROTHERAPY 25GX240 (NEEDLE) IMPLANT
PAD FLOOR 36X40 (MISCELLANEOUS) IMPLANT
PROBE APC STR FIRE (PROBE) IMPLANT
PROBE INJECTION GOLD (MISCELLANEOUS)
PROBE INJECTION GOLD 7FR (MISCELLANEOUS) IMPLANT
SNARE ROTATE MED OVAL 20MM (MISCELLANEOUS) IMPLANT
SNARE SHORT THROW 13M SML OVAL (MISCELLANEOUS) ×3 IMPLANT
SYR 50ML LL SCALE MARK (SYRINGE) IMPLANT
SYR INFLATION 60ML (SYRINGE) ×3 IMPLANT
TRAP SPECIMEN MUCOUS 40CC (MISCELLANEOUS) IMPLANT
TUBING INSUFFLATOR CO2MPACT (TUBING) ×3 IMPLANT
TUBING IRRIGATION ENDOGATOR (MISCELLANEOUS) IMPLANT
WATER STERILE IRR 1000ML POUR (IV SOLUTION) IMPLANT

## 2015-01-25 NOTE — Anesthesia Preprocedure Evaluation (Addendum)
Anesthesia Evaluation  Patient identified by MRN, date of birth, ID band Patient awake    Reviewed: Allergy & Precautions, NPO status , Patient's Chart, lab work & pertinent test results  Airway Mallampati: II  TM Distance: >3 FB     Dental  (+) Edentulous Upper, Poor Dentition   Pulmonary Current Smoker,  breath sounds clear to auscultation        Cardiovascular hypertension, Pt. on medications Rhythm:Regular Rate:Normal     Neuro/Psych    GI/Hepatic GERD-  Medicated,(+)     substance abuse  alcohol use, cocaine use and marijuana use,   Endo/Other    Renal/GU      Musculoskeletal  (+) Arthritis -,   Abdominal   Peds  Hematology   Anesthesia Other Findings   Reproductive/Obstetrics                            Anesthesia Physical Anesthesia Plan  ASA: III  Anesthesia Plan: MAC   Post-op Pain Management:    Induction: Intravenous  Airway Management Planned: Simple Face Mask  Additional Equipment:   Intra-op Plan:   Post-operative Plan:   Informed Consent: I have reviewed the patients History and Physical, chart, labs and discussed the procedure including the risks, benefits and alternatives for the proposed anesthesia with the patient or authorized representative who has indicated his/her understanding and acceptance.     Plan Discussed with:   Anesthesia Plan Comments: (Drug screen today is pos for cocaine, will postpone and reschedule for later date. Pt advised.)       Anesthesia Quick Evaluation

## 2015-01-25 NOTE — OR Nursing (Signed)
Urine drug screen  Positive for cocaine . Procedure cancelled

## 2015-01-25 NOTE — Telephone Encounter (Signed)
Per Dr. Gala Romney the patient needs to come back in a couple of weeks to the office to see a provider.  His drug screen was positive for cocaine.   Routing to [place].

## 2015-01-25 NOTE — Telephone Encounter (Signed)
APPOINTMENT MADE AND LETTER SENT °

## 2015-02-12 ENCOUNTER — Ambulatory Visit: Payer: Medicare Other | Admitting: Nurse Practitioner

## 2015-02-14 ENCOUNTER — Ambulatory Visit (INDEPENDENT_AMBULATORY_CARE_PROVIDER_SITE_OTHER): Payer: Medicare Other | Admitting: Nurse Practitioner

## 2015-02-14 ENCOUNTER — Encounter: Payer: Self-pay | Admitting: Nurse Practitioner

## 2015-02-14 ENCOUNTER — Other Ambulatory Visit: Payer: Self-pay

## 2015-02-14 VITALS — BP 124/77 | HR 78 | Temp 97.7°F | Ht 68.0 in | Wt 156.6 lb

## 2015-02-14 DIAGNOSIS — R634 Abnormal weight loss: Secondary | ICD-10-CM | POA: Diagnosis not present

## 2015-02-14 DIAGNOSIS — K625 Hemorrhage of anus and rectum: Secondary | ICD-10-CM

## 2015-02-14 DIAGNOSIS — R5383 Other fatigue: Secondary | ICD-10-CM

## 2015-02-14 MED ORDER — PEG 3350-KCL-NA BICARB-NACL 420 G PO SOLR: 4000.0000 mL | Freq: Once | ORAL | Status: DC

## 2015-02-14 NOTE — Progress Notes (Signed)
cc'ed to pcp °

## 2015-02-14 NOTE — Assessment & Plan Note (Signed)
54 year old male presents for followup on weakness, fatigue, hematochezia with every bowel movement, and weight loss. Symptoms are uncharged. Was scheduled for a colonoscopy about 3 weeks ago in the OR with propofol due to chronic ETOH and polypharmacy, as well as drug use. However at his preop visit he tested positive for cocaine and the procedure was cancelled. Today he states he last used cocaine about 1 week ago. Will reschedule colonoscopy with preop UDS the day before. Will plan for this in 3-3.5 weeks. Explained need for patient to abstain from cocaine and why this is necessary. Patient verbalized understanding and agreed to abstain.  Proceed with TCS with Dr. Gala Romney in the OR with propofol in near future: the risks, benefits, and alternatives have been discussed with the patient in detail. The patient states understanding and desires to proceed.  Will order UDS day prior.

## 2015-02-14 NOTE — Patient Instructions (Signed)
1. We will schedule your colonoscopy for about 3.5 weeks from now. It has to be within 4 weeks to avoid another office visit (per insurance requirements) but needs to be 4 weeks aster last use 2. They will do a drug screening before hand 3. Further recommendations to be based on the results of your procedure.

## 2015-02-14 NOTE — Progress Notes (Signed)
Referring Provider: Alycia Rossetti, MD Primary Care Physician:  Vic Blackbird, MD Primary GI: Dr. Gala Romney  Chief Complaint  Patient presents with  . Colonoscopy    HPI:   54 year old male presents to reschedule colonoscopy. Previously scheduled for 01/25/15 but tested + cocaine and procedure cancelled.  Today states he's been doing about the same. Continued weakness, about 3 bowel movements a day and still loose. Continue hematochezia moderate in amount with every bowel movement, in the water. Appetitive is "up and down" as is his weight. Denies fever, chills, chest pain, palpitations, syncope. Admits occasional dyspnea. Denies any new upper or lower GI symptoms. State she has been under a lot of stress lately. Last used cocaine about 1 week ago.  Past Medical History  Diagnosis Date  . Hypertension   . GERD (gastroesophageal reflux disease)   . Arthritis     Past Surgical History  Procedure Laterality Date  . Appendectomy  2011  . Colonoscopy      Approx 2000 in [place], [place]; infectious colitis, no polyps/masses (per patient; records not available)  . Joint replacement Right     ankle- has been broken 4x    Current Outpatient Prescriptions  Medication Sig Dispense Refill  . acetaminophen (TYLENOL) 500 MG tablet Take 500-1,000 mg by mouth every 6 (six) hours as needed for mild pain.    Marland Kitchen dexlansoprazole (DEXILANT) 60 MG capsule Take 1 capsule (60 mg total) by mouth daily. 30 capsule 3  . lisinopril (PRINIVIL,ZESTRIL) 40 MG tablet Take 1 tablet (40 mg total) by mouth daily. 30 tablet 3  . pantoprazole (PROTONIX) 40 MG tablet Take 40 mg by mouth daily.    Marland Kitchen HYDROcodone-acetaminophen (NORCO/VICODIN) 5-325 MG per tablet Take 1 tablet by mouth every 6 (six) hours as needed. (Patient not taking: Reported on 02/14/2015) 45 tablet 0  . polyethylene glycol-electrolytes (NULYTELY/GOLYTELY) 420 G solution Take 4,000 mLs by mouth once. (Patient not taking: Reported on 02/14/2015) 4000 mL  0   No current facility-administered medications for this visit.    Allergies as of 02/14/2015  . (No Known Allergies)    Family History  Problem Relation Age of Onset  . Arthritis Mother   . Cancer Mother     unknown type  . Diabetes Mother   . Hypertension Mother   . Arthritis Sister   . Cancer Sister     unknown type, possibly breast CA  . COPD Brother   . Arthritis Sister   . Arthritis Sister   . Arthritis Sister   . Colon cancer Neg Hx   . Pancreatic cancer Neg Hx     History   Social History  . Marital Status: Single    Spouse Name: N/A  . Number of Children: N/A  . Years of Education: N/A   Social History Main Topics  . Smoking status: Current Every Day Smoker -- 1.50 packs/day for 40 years    Types: Cigarettes  . Smokeless tobacco: Former Systems developer  . Alcohol Use: 7.2 oz/week    12 Cans of beer, 0 Standard drinks or equivalent per week     Comment: varies in amount, sometimes a pint a day, will sometimes go a week without.  . Drug Use: Yes    Special: Marijuana, Cocaine     Comment: occasional/rarely-last used about 1 month ago.  Marland Kitchen Sexual Activity: Yes   Other Topics Concern  . None   Social History Narrative    Review of Systems: Gen: Denies  fever, chills, anorexia.  CV: Denies chest pain, palpitations, syncope, peripheral edema. Resp: Denies dyspnea at rest, wheezing, coughing up blood, and pleurisy. GI: See HPI. Denies vomiting blood, jaundice, and fecal incontinence. Denies dysphagia or odynophagia. Derm: Denies rash, itching, dry skin Psych: Denies depression, memory loss, confusion. Admits high stress. Heme: Denies bruising, bleeding, and enlarged lymph nodes.  Physical Exam: BP 124/77 mmHg  Pulse 78  Temp(Src) 97.7 F (36.5 C) (Oral)  Ht 5\' 8"  (1.727 m)  Wt 156 lb 9.6 oz (71.033 kg)  BMI 23.82 kg/m2 General:   Alert and oriented. No distress noted. Pleasant and cooperative. Appears stressed/anxious. Head:  Normocephalic and  atraumatic. Eyes:  Conjuctiva clear without scleral icterus. Lungs:  Clear to auscultation bilaterally. No wheezes, rales, or rhonchi. No distress.  Heart:  S1, S2 present without murmurs, rubs, or gallops. Regular rate and rhythm. Abdomen:  +BS, soft, non-tender and non-distended. No rebound or guarding. No HSM or masses noted. Extremities:  Without edema. Neurologic:  Alert and  oriented x4;  grossly normal neurologically. Skin:  Intact without significant lesions or rashes. Cervical Nodes:  No significant cervical adenopathy. Psych:  Alert and cooperative. Normal mood and affect.    02/14/2015 10:48 AM

## 2015-02-20 ENCOUNTER — Other Ambulatory Visit: Payer: Self-pay | Admitting: *Deleted

## 2015-02-20 ENCOUNTER — Telehealth: Payer: Self-pay | Admitting: Family Medicine

## 2015-02-20 MED ORDER — HYDROCODONE-ACETAMINOPHEN 5-325 MG PO TABS: 1.0000 | ORAL_TABLET | Freq: Four times a day (QID) | ORAL | Status: DC | PRN

## 2015-02-20 MED ORDER — LISINOPRIL 40 MG PO TABS: 40.0000 mg | ORAL_TABLET | Freq: Every day | ORAL | Status: DC

## 2015-02-20 NOTE — Telephone Encounter (Signed)
Okay to refill, can you have front desk check on his records from [place]

## 2015-02-20 NOTE — Telephone Encounter (Signed)
Front desk made aware.   Prescription printed and patient made aware to come to office to pick up.

## 2015-02-20 NOTE — Telephone Encounter (Signed)
Received fax requesting refill on Lisinopril with 90 day supply.   Refill appropriate and filled per protocol.

## 2015-02-20 NOTE — Telephone Encounter (Signed)
Ok to refill hydrocodone??  Last office visit/ refill 01/23/2015.

## 2015-02-20 NOTE — Telephone Encounter (Signed)
Patient is calling to get refill on pain medication  [phone] and talk to andre when ready

## 2015-03-05 ENCOUNTER — Other Ambulatory Visit: Payer: Self-pay | Admitting: Family Medicine

## 2015-03-05 ENCOUNTER — Telehealth: Payer: Self-pay | Admitting: *Deleted

## 2015-03-05 ENCOUNTER — Telehealth: Payer: Self-pay | Admitting: Internal Medicine

## 2015-03-05 DIAGNOSIS — G8929 Other chronic pain: Secondary | ICD-10-CM

## 2015-03-05 DIAGNOSIS — M25579 Pain in unspecified ankle and joints of unspecified foot: Principal | ICD-10-CM

## 2015-03-05 DIAGNOSIS — M19079 Primary osteoarthritis, unspecified ankle and foot: Secondary | ICD-10-CM

## 2015-03-05 NOTE — Patient Instructions (Signed)
David Curtis  03/05/2015   Your procedure is scheduled on:  03/08/2015  Report to [facility] at  53  AM.  Call this number if you have problems the morning of surgery: [phone]   Remember:   Do not eat food or drink liquids after midnight.   Take these medicines the morning of surgery with A SIP OF WATER:  Dexilant, hydrocodone, lisinopril, protonix   Do not wear jewelry, make-up or nail polish.  Do not wear lotions, powders, or perfumes.   Do not shave 48 hours prior to surgery. Men may shave face and neck.  Do not bring valuables to the hospital.  [facility] is not responsible for any belongings or valuables.               Contacts, dentures or bridgework may not be worn into surgery.  Leave suitcase in the car. After surgery it may be brought to your room.  For patients admitted to the hospital, discharge time is determined by your treatment team.               Patients discharged the day of surgery will not be allowed to drive home.  Name and phone number of your driver: family  Special Instructions: N/A   Please read over the following fact sheets that you were given: Pain Booklet, Coughing and Deep Breathing, Surgical Site Infection Prevention, Anesthesia Post-op Instructions and Care and Recovery After Surgery Colonoscopy A colonoscopy is an exam to look at the entire large intestine (colon). This exam can help find problems such as tumors, polyps, inflammation, and areas of bleeding. The exam takes about 1 hour.  LET [facility] CARE PROVIDER KNOW ABOUT:   Any allergies you have.  All medicines you are taking, including vitamins, herbs, eye drops, creams, and over-the-counter medicines.  Previous problems you or members of your family have had with the use of anesthetics.  Any blood disorders you have.  Previous surgeries you have had.  Medical conditions you have. RISKS AND COMPLICATIONS  Generally, this is a safe procedure. However, as with any  procedure, complications can occur. Possible complications include:  Bleeding.  Tearing or rupture of the colon wall.  Reaction to medicines given during the exam.  Infection (rare). BEFORE THE PROCEDURE   Ask your health care provider about changing or stopping your regular medicines.  You may be prescribed an oral bowel prep. This involves drinking a large amount of medicated liquid, starting the day before your procedure. The liquid will cause you to have multiple loose stools until your stool is almost clear or light green. This cleans out your colon in preparation for the procedure.  Do not eat or drink anything else once you have started the bowel prep, unless your health care provider tells you it is safe to do so.  Arrange for someone to drive you home after the procedure. PROCEDURE   You will be given medicine to help you relax (sedative).  You will lie on your side with your knees bent.  A long, flexible tube with a light and camera on the end (colonoscope) will be inserted through the rectum and into the colon. The camera sends video back to a computer screen as it moves through the colon. The colonoscope also releases carbon dioxide gas to inflate the colon. This helps your health care provider see the area better.  During the exam, your health care provider may take a small tissue sample (biopsy)  to be examined under a microscope if any abnormalities are found.  The exam is finished when the entire colon has been viewed. AFTER THE PROCEDURE   Do not drive for 24 hours after the exam.  You may have a small amount of blood in your stool.  You may pass moderate amounts of gas and have mild abdominal cramping or bloating. This is caused by the gas used to inflate your colon during the exam.  Ask when your test results will be ready and how you will get your results. Make sure you get your test results. Document Released: 11/07/2000 Document Revised: 08/31/2013 Document  Reviewed: 07/18/2013 [facility] Patient Information 2015 [place], [place]. This information is not intended to replace advice given to you by your health care provider. Make sure you discuss any questions you have with your health care provider. PATIENT INSTRUCTIONS POST-ANESTHESIA  IMMEDIATELY FOLLOWING SURGERY:  Do not drive or operate machinery for the first twenty four hours after surgery.  Do not make any important decisions for twenty four hours after surgery or while taking narcotic pain medications or sedatives.  If you develop intractable nausea and vomiting or a severe headache please notify your doctor immediately.  FOLLOW-UP:  Please make an appointment with your surgeon as instructed. You do not need to follow up with anesthesia unless specifically instructed to do so.  WOUND CARE INSTRUCTIONS (if applicable):  Keep a dry clean dressing on the anesthesia/puncture wound site if there is drainage.  Once the wound has quit draining you may leave it open to air.  Generally you should leave the bandage intact for twenty four hours unless there is drainage.  If the epidural site drains for more than 36-48 hours please call the anesthesia department.  QUESTIONS?:  Please feel free to call your physician or the hospital operator if you have any questions, and they will be happy to assist you.

## 2015-03-05 NOTE — Telephone Encounter (Signed)
Patient's wife called to let us know that patient is in [place] on a job and will need to cancel his pre op for today and procedure for Thursday with [facility]. She said that he will call to reschedule when he gets back in town.

## 2015-03-05 NOTE — Telephone Encounter (Signed)
Referral has been placed to Orthopedic surgery

## 2015-03-05 NOTE — Telephone Encounter (Signed)
Noted and pt has been taken off the schedule

## 2015-03-05 NOTE — Telephone Encounter (Signed)
-----   Message from Alycia Rossetti, MD sent at 02/27/2015  5:01 PM EDT ----- Regarding: Call pt   I received his records from his previous orthopedic surgeon, about his chronic ankle pain which looks like chronic arthritis.  Place referral to new  Orthopedics  Dx- Chronic ankle pain, arthritis of ankle, send records with referral- on your desk

## 2015-03-06 ENCOUNTER — Inpatient Hospital Stay (HOSPITAL_COMMUNITY)
Admission: RE | Admit: 2015-03-06 | Discharge: 2015-03-06 | Disposition: A | Discharge status: Home / Self Care | Payer: Medicare Other | Source: Ambulatory Visit

## 2015-03-08 ENCOUNTER — Encounter (HOSPITAL_COMMUNITY): Admission: RE | Payer: Self-pay | Source: Ambulatory Visit

## 2015-03-08 ENCOUNTER — Ambulatory Visit (HOSPITAL_COMMUNITY): Admission: RE | Admit: 2015-03-08 | Payer: Medicare Other | Source: Ambulatory Visit | Admitting: Internal Medicine

## 2015-03-08 SURGERY — COLONOSCOPY WITH PROPOFOL
Anesthesia: Monitor Anesthesia Care

## 2015-03-26 ENCOUNTER — Ambulatory Visit: Payer: Medicare Other | Admitting: Family Medicine

## 2015-04-19 ENCOUNTER — Telehealth: Payer: Self-pay | Admitting: Family Medicine

## 2015-04-19 ENCOUNTER — Encounter: Payer: Self-pay | Admitting: *Deleted

## 2015-04-19 NOTE — Telephone Encounter (Signed)
Patient calling for refill of hydrocodone [phone] or [phone]

## 2015-04-19 NOTE — Telephone Encounter (Signed)
Ok to refill??  Last office visit 01/23/2015.  Last refill 02/20/2015.

## 2015-04-20 MED ORDER — HYDROCODONE-ACETAMINOPHEN 5-325 MG PO TABS: 1.0000 | ORAL_TABLET | Freq: Four times a day (QID) | ORAL | Status: DC | PRN

## 2015-04-20 NOTE — Telephone Encounter (Signed)
Okay to refill? 

## 2015-04-20 NOTE — Telephone Encounter (Signed)
Called place to pt to make aware prescription is ready for pick up

## 2015-04-20 NOTE — Telephone Encounter (Signed)
Script printed ready for provider signature 

## 2015-05-01 ENCOUNTER — Encounter: Payer: Self-pay | Admitting: *Deleted

## 2015-05-30 ENCOUNTER — Telehealth: Payer: Self-pay | Admitting: *Deleted

## 2015-05-30 MED ORDER — HYDROCODONE-ACETAMINOPHEN 5-325 MG PO TABS: 1.0000 | ORAL_TABLET | Freq: Four times a day (QID) | ORAL | Status: DC | PRN

## 2015-05-30 NOTE — Addendum Note (Signed)
Addended by: Sheral Flow on: 05/30/2015 05:08 PM   Modules accepted: Orders

## 2015-05-30 NOTE — Telephone Encounter (Signed)
Received call from patient spouse.   Requested refill on Norco.   Ok to refill??  Last office visit 01/23/2015.  Last refill 04/20/2015.

## 2015-05-30 NOTE — Telephone Encounter (Signed)
Okay to refill? 

## 2015-05-31 NOTE — Telephone Encounter (Signed)
Prescription printed and patient made aware to come to office to pick up.  

## 2015-06-19 ENCOUNTER — Ambulatory Visit (INDEPENDENT_AMBULATORY_CARE_PROVIDER_SITE_OTHER): Payer: Medicare Other | Admitting: Family Medicine

## 2015-06-19 ENCOUNTER — Encounter: Payer: Self-pay | Admitting: Family Medicine

## 2015-06-19 VITALS — BP 140/82 | HR 88 | Temp 98.7°F | Resp 18 | Ht 68.0 in | Wt 155.0 lb

## 2015-06-19 DIAGNOSIS — I10 Essential (primary) hypertension: Secondary | ICD-10-CM | POA: Diagnosis not present

## 2015-06-19 DIAGNOSIS — K625 Hemorrhage of anus and rectum: Secondary | ICD-10-CM

## 2015-06-19 DIAGNOSIS — M25571 Pain in right ankle and joints of right foot: Secondary | ICD-10-CM

## 2015-06-19 DIAGNOSIS — Z789 Other specified health status: Secondary | ICD-10-CM

## 2015-06-19 DIAGNOSIS — F1090 Alcohol use, unspecified, uncomplicated: Secondary | ICD-10-CM

## 2015-06-19 DIAGNOSIS — F1910 Other psychoactive substance abuse, uncomplicated: Secondary | ICD-10-CM | POA: Diagnosis not present

## 2015-06-19 DIAGNOSIS — Z79899 Other long term (current) drug therapy: Secondary | ICD-10-CM | POA: Diagnosis not present

## 2015-06-19 DIAGNOSIS — G8929 Other chronic pain: Secondary | ICD-10-CM | POA: Diagnosis not present

## 2015-06-19 MED ORDER — HYDROCODONE-ACETAMINOPHEN 5-325 MG PO TABS: 1.0000 | ORAL_TABLET | Freq: Two times a day (BID) | ORAL | Status: DC | PRN

## 2015-06-19 MED ORDER — LISINOPRIL 40 MG PO TABS: 40.0000 mg | ORAL_TABLET | Freq: Every day | ORAL | Status: DC

## 2015-06-19 NOTE — Patient Instructions (Addendum)
[facility]- [phone]  Pain medication must last next 10 days  We will call with lab results Referral to orthopedics F/U 3 months

## 2015-06-19 NOTE — Progress Notes (Signed)
Patient ID: David Curtis, male   DOB: 1961/05/13, 54 y.o.   MRN: [id number]   Subjective:    Patient ID: David Curtis, male    DOB: 03/26/1961, 54 y.o.   MRN: [id number]  Patient presents for Rectal Bleeding and Medicaiton Review/ Refill  patient had a follow-up. He continues to have chronic ankle pain. He did not follow through with his orthopedic appointment. However I did receive a note from his previous physician before he had multiple surgeries and injury to the ankle which they were giving him shots then. He then states he continues to have rectal bleeding or problems with his bowels I had sent him to gastroenterology to have colonoscopy done when I went back to review their notes it appears that he was cocaine positive when he went to the hospital to have a scope done. He didn't follow up in office and told and he is still using cocaine I was not aware of this and I have maintained him on hydrocodone. He has cut back some on his drinking but still drinks a beer a day with some bourbon in it. He denies any cocaine use for the past 2-3 months. His girlfriend is here today he also admits that he has not been using drugs. He states that he feels weak from all the bowel movements and the bleeding that he is ready to get back on track and get his health nor her.    Review Of Systems:  GEN- denies fatigue, fever, weight loss,weakness, recent illness HEENT- denies eye drainage, change in vision, nasal discharge, CVS- denies chest pain, palpitations RESP- denies SOB, cough, wheeze ABD- denies N/V, change in stools, abd pain GU- denies dysuria, hematuria, dribbling, incontinence MSK- + joint pain, muscle aches, injury Neuro- denies headache, dizziness, syncope, seizure activity       Objective:    BP 140/82 mmHg  Pulse 88  Temp(Src) 98.7 F (37.1 C) (Oral)  Resp 18  Ht 5\' 8"  (1.727 m)  Wt 155 lb (70.308 kg)  BMI 23.57 kg/m2 GEN- NAD, alert and oriented x3 CVS-RRR, no  murmur RESP-STAB ABD-NABS,soft, mild TTP Lower quadrants, no rebound, no guarding Ext- swelling right ankle- chronic, decreased ROM PYSCH- normal affect and mood Pulse- DP, RADIAL 2+        Assessment & Plan:      Problem List Items Addressed This Visit    Rectal bleeding   Relevant Orders   CBC with Differential/Platelet   Comprehensive metabolic panel   Heavy alcohol use - Primary   Essential hypertension   Relevant Medications   lisinopril (PRINIVIL,ZESTRIL) 40 MG tablet   Other Relevant Orders   CBC with Differential/Platelet   Comprehensive metabolic panel   Chronic ankle pain    Other Visit Diagnoses    Long-term use of high-risk medication        Relevant Orders    Prescript Monitor Profile(13)       Note: This dictation was prepared with Dragon dictation along with smaller phrase technology. Any transcriptional errors that result from this process are unintentional.

## 2015-06-20 DIAGNOSIS — F1910 Other psychoactive substance abuse, uncomplicated: Secondary | ICD-10-CM | POA: Insufficient documentation

## 2015-06-20 LAB — CBC WITH DIFFERENTIAL/PLATELET
BASOS ABS: 0 10*3/uL (ref 0.0–0.1)
Basophils Relative: 0 % (ref 0–1)
EOS PCT: 2 % (ref 0–5)
Eosinophils Absolute: 0.1 10*3/uL (ref 0.0–0.7)
HCT: 41.3 % (ref 39.0–52.0)
Hemoglobin: 13.8 g/dL (ref 13.0–17.0)
Lymphocytes Relative: 37 % (ref 12–46)
Lymphs Abs: 2 10*3/uL (ref 0.7–4.0)
MCH: 31.6 pg (ref 26.0–34.0)
MCHC: 33.4 g/dL (ref 30.0–36.0)
MCV: 94.5 fL (ref 78.0–100.0)
MPV: 9.4 fL (ref 8.6–12.4)
Monocytes Absolute: 0.5 10*3/uL (ref 0.1–1.0)
Monocytes Relative: 10 % (ref 3–12)
Neutro Abs: 2.8 10*3/uL (ref 1.7–7.7)
Neutrophils Relative %: 51 % (ref 43–77)
Platelets: 270 10*3/uL (ref 150–400)
RBC: 4.37 MIL/uL (ref 4.22–5.81)
RDW: 13.9 % (ref 11.5–15.5)
WBC: 5.4 10*3/uL (ref 4.0–10.5)

## 2015-06-20 LAB — PRESCRIPTION MONITORING PROFILE (13 PANEL)
Amphetamine/Meth: NEGATIVE ng/mL
BARBITURATE SCREEN, URINE: NEGATIVE ng/mL
Benzodiazepine Screen, Urine: NEGATIVE ng/mL
Buprenorphine, Urine: NEGATIVE ng/mL
Cannabinoid Scrn, Ur: NEGATIVE ng/mL
Cocaine Metabolites: NEGATIVE ng/mL
Creatinine, Urine: 205.4 mg/dL (ref >20.0)
Fentanyl, Ur: NEGATIVE ng/mL
MEPERIDINE UR: NEGATIVE ng/mL
Methadone Screen, Urine: NEGATIVE ng/mL
NITRITES URINE, INITIAL: NEGATIVE ug/mL
Opiate Screen, Urine: NEGATIVE ng/mL
Oxycodone Screen, Ur: NEGATIVE ng/mL
Propoxyphene: NEGATIVE ng/mL
TRAMADOL UR: NEGATIVE ng/mL
pH, Initial: 5.2 pH (ref 4.5–8.9)

## 2015-06-20 LAB — COMPREHENSIVE METABOLIC PANEL
ALT: 16 U/L (ref 9–46)
AST: 19 U/L (ref 10–35)
Albumin: 4.4 g/dL (ref 3.6–5.1)
Alkaline Phosphatase: 54 U/L (ref 40–115)
BILIRUBIN TOTAL: 0.3 mg/dL (ref 0.2–1.2)
BUN: 10 mg/dL (ref 7–25)
CO2: 24 mEq/L (ref 20–31)
Calcium: 9.8 mg/dL (ref 8.6–10.3)
Chloride: 103 mEq/L (ref 98–110)
Creat: 0.99 mg/dL (ref 0.70–1.33)
GLUCOSE: 85 mg/dL (ref 70–99)
POTASSIUM: 4.4 meq/L (ref 3.5–5.3)
Sodium: 139 mEq/L (ref 135–146)
Total Protein: 6.9 g/dL (ref 6.1–8.1)

## 2015-06-20 NOTE — Assessment & Plan Note (Signed)
Blood pressure looks okay today I'm going to continue his lisinopril at 40 mg

## 2015-06-20 NOTE — Assessment & Plan Note (Signed)
Discussed importance of cutting back on his alcohol especially removing the hard liquor from the beer that he drinks

## 2015-06-20 NOTE — Assessment & Plan Note (Signed)
I was not aware the positive cocaine at his preop labs back in March. I have continued to give him his hydrocodone possibly every couple of months. He has legitimate pain and was on chronic pain medications for his ankle. I have taken a drug screen today of only given him 20 tablets of hydrocodone which needs to last the next 10 days. He is aware that if anything is positive and his drug screen L and no longer provide him with pain medication. If his screen is negative we will also proceed with reschedule his colonoscopy for the rectal bleeding as the source has not been found

## 2015-06-20 NOTE — Assessment & Plan Note (Signed)
Referral back to orthopedics. He agrees to follow through at this time. He was getting injections secondary to the multiple injuries in his ankle.

## 2015-06-22 ENCOUNTER — Telehealth: Payer: Self-pay | Admitting: Nurse Practitioner

## 2015-06-22 NOTE — Telephone Encounter (Signed)
PCP ordered UDS which was clean, continued rectal bleeding and requesting reschedule of TCS. Please call patient to schedule an OV to set-up colonoscopy.

## 2015-06-25 NOTE — Telephone Encounter (Signed)
Spoke with pt girlfriend. Pt has appt on 07/17/2015 @ 1:30pm

## 2015-07-16 ENCOUNTER — Telehealth: Payer: Self-pay | Admitting: Family Medicine

## 2015-07-16 MED ORDER — HYDROCODONE-ACETAMINOPHEN 5-325 MG PO TABS: 1.0000 | ORAL_TABLET | Freq: Two times a day (BID) | ORAL | Status: DC | PRN

## 2015-07-16 NOTE — Telephone Encounter (Signed)
Patient calling to get rx for his norco  When ready [phone]

## 2015-07-16 NOTE — Telephone Encounter (Signed)
Okay to refill? 

## 2015-07-16 NOTE — Telephone Encounter (Signed)
Ok to refill??  Last office visit/ refill 06/19/2015.

## 2015-07-16 NOTE — Telephone Encounter (Signed)
Prescription printed and patient made aware to come to office to pick up after 2pm on 07/16/2015.

## 2015-07-17 ENCOUNTER — Encounter: Payer: Self-pay | Admitting: *Deleted

## 2015-07-17 ENCOUNTER — Ambulatory Visit: Payer: Medicare Other | Admitting: Nurse Practitioner

## 2015-07-18 ENCOUNTER — Ambulatory Visit (INDEPENDENT_AMBULATORY_CARE_PROVIDER_SITE_OTHER): Payer: Medicare Other | Admitting: Nurse Practitioner

## 2015-07-18 ENCOUNTER — Other Ambulatory Visit: Payer: Self-pay

## 2015-07-18 ENCOUNTER — Encounter: Payer: Self-pay | Admitting: Nurse Practitioner

## 2015-07-18 VITALS — BP 103/60 | HR 70 | Temp 97.6°F | Ht 68.0 in | Wt 158.0 lb

## 2015-07-18 DIAGNOSIS — K625 Hemorrhage of anus and rectum: Secondary | ICD-10-CM

## 2015-07-18 DIAGNOSIS — R634 Abnormal weight loss: Secondary | ICD-10-CM

## 2015-07-18 MED ORDER — PEG 3350-KCL-NA BICARB-NACL 420 G PO SOLR: 4000.0000 mL | Freq: Once | ORAL | Status: DC

## 2015-07-18 NOTE — Progress Notes (Signed)
Referring Provider: Alycia Rossetti, MD Primary Care Physician:  Vic Blackbird, MD Primary GI:  Dr. Gala Romney  Chief Complaint  Patient presents with  . Colonoscopy    HPI:   54 year old male presents to schedule colonoscopy for rectal bleeding and loss of weight. He was previously seen on 02/14/2015 and arrange for colonoscopy in the OR with propofol. The procedure had to be rescheduled due to the patient having to go out of town for work on the date initially scheduled. He has a history of drug use and will need a urine drug screen prior to the procedure. Per telephone note on 06/22/2015 his PCP ordered a UDS which was negative.   Today he states he's doing the same. Continued rectal bleeding for the past month, sometimes heavier than others. Admits generalized abdominal pain which is crampy. Denies N/V, stools occasionally loose. Denies chest pain, dyspnea, dizziness, lightheadedness, syncope, near syncope. No further weight loss from last visit. Denies any other upper or lower GI symptoms.  Past Medical History  Diagnosis Date  . Hypertension   . GERD (gastroesophageal reflux disease)   . Arthritis     Past Surgical History  Procedure Laterality Date  . Appendectomy  2011  . Colonoscopy      Approx 2000 in [place], [place]; infectious colitis, no polyps/masses (per patient; records not available)  . Joint replacement Right     ankle- has been broken 4x    Current Outpatient Prescriptions  Medication Sig Dispense Refill  . HYDROcodone-acetaminophen (NORCO/VICODIN) 5-325 MG per tablet Take 1 tablet by mouth 2 (two) times daily as needed. 20 tablet 0  . lisinopril (PRINIVIL,ZESTRIL) 40 MG tablet Take 1 tablet (40 mg total) by mouth daily. 30 tablet 6  . pantoprazole (PROTONIX) 40 MG tablet Take 40 mg by mouth daily.    . polyethylene glycol-electrolytes (NULYTELY/GOLYTELY) 420 G solution Take 4,000 mLs by mouth once. (Patient not taking: Reported on 06/19/2015) 4000 mL 0   No  current facility-administered medications for this visit.    Allergies as of 07/18/2015  . (No Known Allergies)    Family History  Problem Relation Age of Onset  . Arthritis Mother   . Cancer Mother     unknown type  . Diabetes Mother   . Hypertension Mother   . Arthritis Sister   . Cancer Sister     unknown type, possibly breast CA  . COPD Brother   . Arthritis Sister   . Arthritis Sister   . Arthritis Sister   . Colon cancer Neg Hx   . Pancreatic cancer Neg Hx     Social History   Social History  . Marital Status: Single    Spouse Name: N/A  . Number of Children: N/A  . Years of Education: N/A   Social History Main Topics  . Smoking status: Current Every Day Smoker -- 1.50 packs/day for 40 years    Types: Cigarettes  . Smokeless tobacco: Former Systems developer  . Alcohol Use: 7.2 oz/week    12 Cans of beer, 0 Standard drinks or equivalent per week     Comment: varies in amount, sometimes a pint a day, will sometimes go a week without.  . Drug Use: Yes    Special: Marijuana, Cocaine     Comment: Last cocaine was about 1 week ago.  Marland Kitchen Sexual Activity: Yes   Other Topics Concern  . None   Social History Narrative    Review of Systems: General: Negative for anorexia,  weight loss, fever, chills, fatigue, weakness. Eyes: Negative for vision changes.  ENT: Negative for hoarseness, difficulty swallowing , nasal congestion. CV: Negative for chest pain, angina, palpitations, dyspnea on exertion, peripheral edema.  Respiratory: Negative for dyspnea at rest, dyspnea on exertion, cough, sputum, wheezing.  GI: See history of present illness. GU:  Negative for dysuria, hematuria, urinary incontinence, urinary frequency, nocturnal urination.  MS: Negative for joint pain, low back pain.  Derm: Negative for rash or itching.  Neuro: Negative for weakness, abnormal sensation, seizure, frequent headaches, memory loss, confusion.  Psych: Negative for anxiety, depression, suicidal  ideation, hallucinations.  Endo: Negative for unusual weight change.  Heme: Negative for bruising or bleeding. Allergy: Negative for rash or hives.   Physical Exam: BP 103/60 mmHg  Pulse 70  Temp(Src) 97.6 F (36.4 C) (Oral)  Ht 5\' 8"  (1.727 m)  Wt 158 lb (71.668 kg)  BMI 24.03 kg/m2 General:   Alert and oriented. Pleasant and cooperative. Well-nourished and well-developed.  Head:  Normocephalic and atraumatic. Eyes:  Without icterus, sclera clear and conjunctiva pink.  Ears:  Normal auditory acuity. Mouth:  No deformity or lesions, oral mucosa pink.  Throat/Neck:  Supple, without mass or thyromegaly. Cardiovascular:  S1, S2 present without murmurs appreciated. Normal pulses noted. Extremities without clubbing or edema. Respiratory:  Clear to auscultation bilaterally. No wheezes, rales, or rhonchi. No distress.  Gastrointestinal:  +BS, soft, non-tender and non-distended. No HSM noted. No guarding or rebound. No masses appreciated.  Rectal:  Deferred  Musculoskalatal:  Symmetrical without gross deformities. Normal posture. Skin:  Intact without significant lesions or rashes. Neurologic:  Alert and oriented x4;  grossly normal neurologically. Psych:  Alert and cooperative. Normal mood and affect. Heme/Lymph/Immune: No significant cervical adenopathy. No excessive bruising noted.    07/18/2015 2:41 PM

## 2015-07-18 NOTE — Assessment & Plan Note (Signed)
54 year old male presents to reschedule his colonoscopy. Continues to have rectal bleeding, some days heavier than others. Also with abdominal pain which is described as crampy and generalized. Per him and his symptoms are essentially unchanged from his last visit. He has a history of drug use and was advised that he would have to be completed urine drug screen. He states his last cocaine use was approximately 2 months ago. We'll proceed with diagnostic colonoscopy.  Proceed with TCS with Dr. Gala Romney in the OR with propofol/MAC in near future: the risks, benefits, and alternatives have been discussed with the patient in detail. The patient states understanding and desires to proceed.  Patient has a history of drug use as well as current alcohol use. He is also on chronic pain medications. No anticoagulants. For these reasons we will proceed with the procedure and the OR on propofol/MAC to ensure adequate sedation.

## 2015-07-18 NOTE — Assessment & Plan Note (Signed)
At last visit patient complaint of subjective weight loss. Between his last visit a few months ago and his current visit there is no more objective weight loss, he is added 2 pounds to his weight which is negligible. Given his rectal bleeding we will still plan on proceeding with colonoscopy as noted above. No other red flag/warning signs or symptoms.

## 2015-07-18 NOTE — Patient Instructions (Signed)
1. We will schedule your procedure for you. 2. They will complete a drug screen prior to your procedure 3. Further recommendations to be based on your procedure.

## 2015-07-19 ENCOUNTER — Other Ambulatory Visit: Payer: Self-pay

## 2015-07-19 NOTE — Progress Notes (Signed)
CC'ED TO PCP 

## 2015-08-03 NOTE — Patient Instructions (Signed)
David Curtis  08/03/2015     @PREFPERIOPPHARMACY @   Your procedure is scheduled on 08/09/2015  Report to [facility] at  57  A.M.  Call this number if you have problems the morning of surgery:  [phone]   Remember:  Do not eat food or drink liquids after midnight.  Take these medicines the morning of surgery with A SIP OF WATER hydrocodone, lisinopril, protonix.   Do not wear jewelry, make-up or nail polish.  Do not wear lotions, powders, or perfumes.  You may wear deodorant.  Do not shave 48 hours prior to surgery.  Men may shave face and neck.  Do not bring valuables to the hospital.  [facility] is not responsible for any belongings or valuables.  Contacts, dentures or bridgework may not be worn into surgery.  Leave your suitcase in the car.  After surgery it may be brought to your room.  For patients admitted to the hospital, discharge time will be determined by your treatment team.  Patients discharged the day of surgery will not be allowed to drive home.   Name and phone number of your driver:   family Special instructions: Follow the diet and prep instructions given to you by Dr Roseanne Kaufman office.  Please read over the following fact sheets that you were given. Pain Booklet, Surgical Site Infection Prevention, Anesthesia Post-op Instructions and Care and Recovery After Surgery      Colonoscopy A colonoscopy is an exam to look at the entire large intestine (colon). This exam can help find problems such as tumors, polyps, inflammation, and areas of bleeding. The exam takes about 1 hour.  LET [facility] CARE PROVIDER KNOW ABOUT:   Any allergies you have.  All medicines you are taking, including vitamins, herbs, eye drops, creams, and over-the-counter medicines.  Previous problems you or members of your family have had with the use of anesthetics.  Any blood disorders you have.  Previous surgeries you have had.  Medical conditions you have. RISKS AND  COMPLICATIONS  Generally, this is a safe procedure. However, as with any procedure, complications can occur. Possible complications include:  Bleeding.  Tearing or rupture of the colon wall.  Reaction to medicines given during the exam.  Infection (rare). BEFORE THE PROCEDURE   Ask your health care provider about changing or stopping your regular medicines.  You may be prescribed an oral bowel prep. This involves drinking a large amount of medicated liquid, starting the day before your procedure. The liquid will cause you to have multiple loose stools until your stool is almost clear or light green. This cleans out your colon in preparation for the procedure.  Do not eat or drink anything else once you have started the bowel prep, unless your health care provider tells you it is safe to do so.  Arrange for someone to drive you home after the procedure. PROCEDURE   You will be given medicine to help you relax (sedative).  You will lie on your side with your knees bent.  A long, flexible tube with a light and camera on the end (colonoscope) will be inserted through the rectum and into the colon. The camera sends video back to a computer screen as it moves through the colon. The colonoscope also releases carbon dioxide gas to inflate the colon. This helps your health care provider see the area better.  During the exam, your health care provider may take a small tissue sample (biopsy) to  be examined under a microscope if any abnormalities are found.  The exam is finished when the entire colon has been viewed. AFTER THE PROCEDURE   Do not drive for 24 hours after the exam.  You may have a small amount of blood in your stool.  You may pass moderate amounts of gas and have mild abdominal cramping or bloating. This is caused by the gas used to inflate your colon during the exam.  Ask when your test results will be ready and how you will get your results. Make sure you get your test  results. Document Released: 11/07/2000 Document Revised: 08/31/2013 Document Reviewed: 07/18/2013 [facility] Patient Information 2015 [place], [place]. This information is not intended to replace advice given to you by your health care provider. Make sure you discuss any questions you have with your health care provider. PATIENT INSTRUCTIONS POST-ANESTHESIA  IMMEDIATELY FOLLOWING SURGERY:  Do not drive or operate machinery for the first twenty four hours after surgery.  Do not make any important decisions for twenty four hours after surgery or while taking narcotic pain medications or sedatives.  If you develop intractable nausea and vomiting or a severe headache please notify your doctor immediately.  FOLLOW-UP:  Please make an appointment with your surgeon as instructed. You do not need to follow up with anesthesia unless specifically instructed to do so.  WOUND CARE INSTRUCTIONS (if applicable):  Keep a dry clean dressing on the anesthesia/puncture wound site if there is drainage.  Once the wound has quit draining you may leave it open to air.  Generally you should leave the bandage intact for twenty four hours unless there is drainage.  If the epidural site drains for more than 36-48 hours please call the anesthesia department.  QUESTIONS?:  Please feel free to call your physician or the hospital operator if you have any questions, and they will be happy to assist you.

## 2015-08-06 ENCOUNTER — Encounter (HOSPITAL_COMMUNITY)
Admission: RE | Admit: 2015-08-06 | Discharge: 2015-08-06 | Disposition: A | Discharge status: Home / Self Care | Payer: Medicare Other | Source: Ambulatory Visit | Attending: Internal Medicine | Admitting: Internal Medicine

## 2015-08-06 NOTE — Patient Instructions (Signed)
David Curtis  08/06/2015     @PREFPERIOPPHARMACY @   Your procedure is scheduled on 08/09/2015.  Report to [facility] at 9:15 A.M.  Call this number if you have problems the morning of surgery:  [phone]   Remember:  Do not eat food or drink liquids after midnight.  Take these medicines the morning of surgery with A SIP OF WATER Lisinopril, Protonix, Hydrocodone if   needed   Do not wear jewelry, make-up or nail polish.  Do not wear lotions, powders, or perfumes.  You may wear deodorant.  Do not shave 48 hours prior to surgery.  Men may shave face and neck.  Do not bring valuables to the hospital.  [facility] is not responsible for any belongings or valuables.  Contacts, dentures or bridgework may not be worn into surgery.  Leave your suitcase in the car.  After surgery it may be brought to your room.  For patients admitted to the hospital, discharge time will be determined by your treatment team.  Patients discharged the day of surgery will not be allowed to drive home.   Please read over the following fact sheets that you were given. Anesthesia Post-op Instructions         PATIENT INSTRUCTIONS       POST-ANESTHESIA  IMMEDIATELY FOLLOWING SURGERY:  Do not drive or operate machinery for the first twenty four hours after surgery.  Do not make any important decisions for twenty four hours after surgery or while taking narcotic pain medications or sedatives.  If you develop intractable nausea and vomiting or a severe headache please notify your doctor immediately.  FOLLOW-UP:  Please make an appointment with your surgeon as instructed. You do not need to follow up with anesthesia unless specifically instructed to do so.  WOUND CARE INSTRUCTIONS (if applicable):  Keep a dry clean dressing on the anesthesia/puncture wound site if there is drainage.  Once the wound has quit draining you may leave it open to air.  Generally you should leave the bandage intact for twenty four  hours unless there is drainage.  If the epidural site drains for more than 36-48 hours please call the anesthesia department.  QUESTIONS?:  Please feel free to call your physician or the hospital operator if you have any questions, and they will be happy to assist you.      Colonoscopy A colonoscopy is an exam to look at the entire large intestine (colon). This exam can help find problems such as tumors, polyps, inflammation, and areas of bleeding. The exam takes about 1 hour.  LET [facility] CARE PROVIDER KNOW ABOUT:   Any allergies you have.  All medicines you are taking, including vitamins, herbs, eye drops, creams, and over-the-counter medicines.  Previous problems you or members of your family have had with the use of anesthetics.  Any blood disorders you have.  Previous surgeries you have had.  Medical conditions you have. RISKS AND COMPLICATIONS  Generally, this is a safe procedure. However, as with any procedure, complications can occur. Possible complications include:  Bleeding.  Tearing or rupture of the colon wall.  Reaction to medicines given during the exam.  Infection (rare). BEFORE THE PROCEDURE   Ask your health care provider about changing or stopping your regular medicines.  You may be prescribed an oral bowel prep. This involves drinking a large amount of medicated liquid, starting the day before your procedure. The liquid will cause you to have multiple loose stools until your stool  is almost clear or light green. This cleans out your colon in preparation for the procedure.  Do not eat or drink anything else once you have started the bowel prep, unless your health care provider tells you it is safe to do so.  Arrange for someone to drive you home after the procedure. PROCEDURE   You will be given medicine to help you relax (sedative).  You will lie on your side with your knees bent.  A long, flexible tube with a light and camera on the end  (colonoscope) will be inserted through the rectum and into the colon. The camera sends video back to a computer screen as it moves through the colon. The colonoscope also releases carbon dioxide gas to inflate the colon. This helps your health care provider see the area better.  During the exam, your health care provider may take a small tissue sample (biopsy) to be examined under a microscope if any abnormalities are found.  The exam is finished when the entire colon has been viewed. AFTER THE PROCEDURE   Do not drive for 24 hours after the exam.  You may have a small amount of blood in your stool.  You may pass moderate amounts of gas and have mild abdominal cramping or bloating. This is caused by the gas used to inflate your colon during the exam.  Ask when your test results will be ready and how you will get your results. Make sure you get your test results. Document Released: 11/07/2000 Document Revised: 08/31/2013 Document Reviewed: 07/18/2013 [facility] Patient Information 2015 [place], [place]. This information is not intended to replace advice given to you by your health care provider. Make sure you discuss any questions you have with your health care provider.

## 2015-08-07 ENCOUNTER — Telehealth: Payer: Self-pay | Admitting: Family Medicine

## 2015-08-07 MED ORDER — HYDROCODONE-ACETAMINOPHEN 5-325 MG PO TABS: 1.0000 | ORAL_TABLET | Freq: Two times a day (BID) | ORAL | Status: DC | PRN

## 2015-08-07 NOTE — Telephone Encounter (Signed)
Okay to refill give 45 tabs

## 2015-08-07 NOTE — Telephone Encounter (Signed)
Prescription printed and patient partner made aware to come to office to pick up after 2pm on 08/07/2015.

## 2015-08-07 NOTE — Telephone Encounter (Signed)
Pt is calling for a refill of Hydrocodone.  You can reach him @ [phone]

## 2015-08-07 NOTE — Telephone Encounter (Signed)
Ok to refill??  Last office visit 06/19/2015.  Last refill 07/16/2015.

## 2015-08-08 ENCOUNTER — Encounter (HOSPITAL_COMMUNITY): Payer: Self-pay

## 2015-08-08 ENCOUNTER — Encounter (HOSPITAL_COMMUNITY)
Admission: RE | Admit: 2015-08-08 | Discharge: 2015-08-08 | Disposition: A | Discharge status: Home / Self Care | Payer: Medicare Other | Source: Ambulatory Visit | Attending: Internal Medicine | Admitting: Internal Medicine

## 2015-08-08 DIAGNOSIS — I10 Essential (primary) hypertension: Secondary | ICD-10-CM | POA: Diagnosis not present

## 2015-08-08 DIAGNOSIS — Z79899 Other long term (current) drug therapy: Secondary | ICD-10-CM | POA: Diagnosis not present

## 2015-08-08 DIAGNOSIS — K921 Melena: Secondary | ICD-10-CM | POA: Diagnosis not present

## 2015-08-08 DIAGNOSIS — R634 Abnormal weight loss: Secondary | ICD-10-CM | POA: Diagnosis not present

## 2015-08-08 DIAGNOSIS — D124 Benign neoplasm of descending colon: Secondary | ICD-10-CM | POA: Diagnosis not present

## 2015-08-08 DIAGNOSIS — K635 Polyp of colon: Secondary | ICD-10-CM | POA: Diagnosis not present

## 2015-08-08 DIAGNOSIS — D120 Benign neoplasm of cecum: Secondary | ICD-10-CM | POA: Diagnosis not present

## 2015-08-08 DIAGNOSIS — Z01812 Encounter for preprocedural laboratory examination: Secondary | ICD-10-CM | POA: Diagnosis not present

## 2015-08-08 DIAGNOSIS — K219 Gastro-esophageal reflux disease without esophagitis: Secondary | ICD-10-CM | POA: Diagnosis not present

## 2015-08-08 DIAGNOSIS — K648 Other hemorrhoids: Secondary | ICD-10-CM | POA: Diagnosis not present

## 2015-08-08 LAB — CBC WITH DIFFERENTIAL/PLATELET
BASOS ABS: 0 10*3/uL (ref 0.0–0.1)
BASOS PCT: 1 %
Eosinophils Absolute: 0.1 10*3/uL (ref 0.0–0.7)
Eosinophils Relative: 1 %
HEMATOCRIT: 39.8 % (ref 39.0–52.0)
Hemoglobin: 13.3 g/dL (ref 13.0–17.0)
Lymphocytes Relative: 38 %
Lymphs Abs: 2.5 10*3/uL (ref 0.7–4.0)
MCH: 32 pg (ref 26.0–34.0)
MCHC: 33.4 g/dL (ref 30.0–36.0)
MCV: 95.9 fL (ref 78.0–100.0)
MONO ABS: 0.8 10*3/uL (ref 0.1–1.0)
Monocytes Relative: 12 %
NEUTROS ABS: 3.2 10*3/uL (ref 1.7–7.7)
Neutrophils Relative %: 48 %
PLATELETS: 243 10*3/uL (ref 150–400)
RBC: 4.15 MIL/uL — ABNORMAL LOW (ref 4.22–5.81)
RDW: 13.5 % (ref 11.5–15.5)
WBC: 6.6 10*3/uL (ref 4.0–10.5)

## 2015-08-08 LAB — BASIC METABOLIC PANEL
ANION GAP: 6 (ref 5–15)
BUN: 8 mg/dL (ref 6–20)
CALCIUM: 9.1 mg/dL (ref 8.9–10.3)
CO2: 28 mmol/L (ref 22–32)
Chloride: 104 mmol/L (ref 101–111)
Creatinine, Ser: 0.8 mg/dL (ref 0.61–1.24)
Glucose, Bld: 108 mg/dL — ABNORMAL HIGH (ref 65–99)
Potassium: 3.9 mmol/L (ref 3.5–5.1)
Sodium: 138 mmol/L (ref 135–145)

## 2015-08-09 ENCOUNTER — Ambulatory Visit (HOSPITAL_COMMUNITY)
Admission: RE | Admit: 2015-08-09 | Discharge: 2015-08-09 | Disposition: A | Discharge status: Home Health | Payer: Medicare Other | Source: Ambulatory Visit | Attending: Internal Medicine | Admitting: Internal Medicine

## 2015-08-09 ENCOUNTER — Ambulatory Visit (HOSPITAL_COMMUNITY): Payer: Medicare Other | Admitting: Anesthesiology

## 2015-08-09 ENCOUNTER — Encounter (HOSPITAL_COMMUNITY)
Admission: RE | Disposition: A | Discharge status: Home Health | Payer: Self-pay | Source: Ambulatory Visit | Attending: Internal Medicine

## 2015-08-09 ENCOUNTER — Encounter (HOSPITAL_COMMUNITY): Payer: Self-pay | Admitting: *Deleted

## 2015-08-09 DIAGNOSIS — I10 Essential (primary) hypertension: Secondary | ICD-10-CM | POA: Diagnosis not present

## 2015-08-09 DIAGNOSIS — K219 Gastro-esophageal reflux disease without esophagitis: Secondary | ICD-10-CM | POA: Diagnosis not present

## 2015-08-09 DIAGNOSIS — K921 Melena: Secondary | ICD-10-CM | POA: Insufficient documentation

## 2015-08-09 DIAGNOSIS — K649 Unspecified hemorrhoids: Secondary | ICD-10-CM | POA: Diagnosis not present

## 2015-08-09 DIAGNOSIS — Z86010 Personal history of colonic polyps: Secondary | ICD-10-CM | POA: Insufficient documentation

## 2015-08-09 DIAGNOSIS — K625 Hemorrhage of anus and rectum: Secondary | ICD-10-CM | POA: Diagnosis not present

## 2015-08-09 DIAGNOSIS — Z01812 Encounter for preprocedural laboratory examination: Secondary | ICD-10-CM | POA: Diagnosis not present

## 2015-08-09 DIAGNOSIS — K648 Other hemorrhoids: Secondary | ICD-10-CM | POA: Diagnosis not present

## 2015-08-09 DIAGNOSIS — R634 Abnormal weight loss: Secondary | ICD-10-CM | POA: Insufficient documentation

## 2015-08-09 DIAGNOSIS — D124 Benign neoplasm of descending colon: Secondary | ICD-10-CM | POA: Insufficient documentation

## 2015-08-09 DIAGNOSIS — D120 Benign neoplasm of cecum: Secondary | ICD-10-CM | POA: Insufficient documentation

## 2015-08-09 DIAGNOSIS — K635 Polyp of colon: Secondary | ICD-10-CM | POA: Diagnosis not present

## 2015-08-09 DIAGNOSIS — Z79899 Other long term (current) drug therapy: Secondary | ICD-10-CM | POA: Insufficient documentation

## 2015-08-09 DIAGNOSIS — D125 Benign neoplasm of sigmoid colon: Secondary | ICD-10-CM | POA: Diagnosis not present

## 2015-08-09 HISTORY — PX: POLYPECTOMY: SHX5525

## 2015-08-09 HISTORY — PX: COLONOSCOPY WITH PROPOFOL: SHX5780

## 2015-08-09 LAB — RAPID URINE DRUG SCREEN, HOSP PERFORMED
AMPHETAMINES: NEGATIVE — AB
Barbiturates: NEGATIVE — AB
Benzodiazepines: NEGATIVE — AB
Cocaine: NEGATIVE — AB
OPIATES: NEGATIVE — AB
Tetrahydrocannabinol: NEGATIVE — AB

## 2015-08-09 SURGERY — COLONOSCOPY WITH PROPOFOL
Anesthesia: Monitor Anesthesia Care | Site: Anus

## 2015-08-09 MED ORDER — PROPOFOL INFUSION 10 MG/ML OPTIME
INTRAVENOUS | Status: DC | PRN
  Administered 2015-08-09: 125 ug/kg/min via INTRAVENOUS

## 2015-08-09 MED ORDER — FENTANYL CITRATE (PF) 100 MCG/2ML IJ SOLN: 25.0000 ug | INTRAMUSCULAR | Status: DC | PRN

## 2015-08-09 MED ORDER — STERILE WATER FOR IRRIGATION IR SOLN
Status: DC | PRN
  Administered 2015-08-09: 1000 mL

## 2015-08-09 MED ORDER — MIDAZOLAM HCL 5 MG/5ML IJ SOLN
INTRAMUSCULAR | Status: DC | PRN
  Administered 2015-08-09: 2 mg via INTRAVENOUS

## 2015-08-09 MED ORDER — FENTANYL CITRATE (PF) 100 MCG/2ML IJ SOLN
INTRAMUSCULAR | Status: DC | PRN
  Administered 2015-08-09: 25 ug via INTRAVENOUS

## 2015-08-09 MED ORDER — SIMETHICONE 40 MG/0.6ML PO SUSP
80.0000 mg | Freq: Once | ORAL | Status: AC
  Administered 2015-08-09: 80 mg via ORAL

## 2015-08-09 MED ORDER — ONDANSETRON HCL 4 MG/2ML IJ SOLN
4.0000 mg | Freq: Once | INTRAMUSCULAR | Status: AC
  Administered 2015-08-09: 4 mg via INTRAVENOUS
  Filled 2015-08-09: qty 2

## 2015-08-09 MED ORDER — WATER FOR IRRIGATION, STERILE IR SOLN
Status: DC | PRN
  Administered 2015-08-09: 1000 mL via SURGICAL_CAVITY

## 2015-08-09 MED ORDER — LIDOCAINE HCL (CARDIAC) 10 MG/ML IV SOLN
INTRAVENOUS | Status: DC | PRN
Start: 2015-08-09 — End: 2015-08-09
  Administered 2015-08-09: 50 mg via INTRAVENOUS

## 2015-08-09 MED ORDER — LACTATED RINGERS IV SOLN
INTRAVENOUS | Status: DC
  Administered 2015-08-09: 1000 mL via INTRAVENOUS

## 2015-08-09 MED ORDER — GLYCOPYRROLATE 0.2 MG/ML IJ SOLN
0.2000 mg | Freq: Once | INTRAMUSCULAR | Status: AC
  Administered 2015-08-09: 0.2 mg via INTRAVENOUS
  Filled 2015-08-09: qty 1

## 2015-08-09 MED ORDER — MIDAZOLAM HCL 2 MG/2ML IJ SOLN
1.0000 mg | INTRAMUSCULAR | Status: DC | PRN
  Administered 2015-08-09: 2 mg via INTRAVENOUS
  Filled 2015-08-09: qty 2

## 2015-08-09 MED ORDER — SODIUM CHLORIDE 0.9 % IJ SOLN
INTRAMUSCULAR | Status: AC
  Filled 2015-08-09: qty 12

## 2015-08-09 MED ORDER — ONDANSETRON HCL 4 MG/2ML IJ SOLN: 4.0000 mg | Freq: Once | INTRAMUSCULAR | Status: DC | PRN

## 2015-08-09 MED ORDER — FENTANYL CITRATE (PF) 100 MCG/2ML IJ SOLN
25.0000 ug | INTRAMUSCULAR | Status: AC
  Administered 2015-08-09 (×2): 25 ug via INTRAVENOUS
  Filled 2015-08-09: qty 2

## 2015-08-09 SURGICAL SUPPLY — 22 items
ELECT REM PT RETURN 9FT ADLT (ELECTROSURGICAL)
ELECTRODE REM PT RTRN 9FT ADLT (ELECTROSURGICAL) IMPLANT
FCP BXJMBJMB 240X2.8X (CUTTING FORCEPS)
FLOOR PAD 36X40 (MISCELLANEOUS)
FORCEPS BIOP RAD 4 LRG CAP 4 (CUTTING FORCEPS) ×3 IMPLANT
FORCEPS BIOP RJ4 240 W/NDL (CUTTING FORCEPS)
FORCEPS BXJMBJMB 240X2.8X (CUTTING FORCEPS) IMPLANT
FORMALIN 10 PREFIL 20ML (MISCELLANEOUS) ×9 IMPLANT
INJECTOR/SNARE I SNARE (MISCELLANEOUS) IMPLANT
KIT ENDO PROCEDURE PEN (KITS) ×3 IMPLANT
MANIFOLD NEPTUNE II (INSTRUMENTS) ×3 IMPLANT
NEEDLE SCLEROTHERAPY 25GX240 (NEEDLE) IMPLANT
PAD FLOOR 36X40 (MISCELLANEOUS) IMPLANT
PROBE APC STR FIRE (PROBE) IMPLANT
PROBE INJECTION GOLD (MISCELLANEOUS)
PROBE INJECTION GOLD 7FR (MISCELLANEOUS) IMPLANT
SNARE ROTATE MED OVAL 20MM (MISCELLANEOUS) ×3 IMPLANT
SNARE SHORT THROW 13M SML OVAL (MISCELLANEOUS) IMPLANT
SYR 50ML LL SCALE MARK (SYRINGE) ×3 IMPLANT
TRAP SPECIMEN MUCOUS 40CC (MISCELLANEOUS) IMPLANT
TUBING IRRIGATION ENDOGATOR (MISCELLANEOUS) ×3 IMPLANT
WATER STERILE IRR 1000ML POUR (IV SOLUTION) ×3 IMPLANT

## 2015-08-09 NOTE — Anesthesia Preprocedure Evaluation (Deleted)
Anesthesia Evaluation    Airway        Dental   Pulmonary Current Smoker,           Cardiovascular hypertension,      Neuro/Psych    GI/Hepatic   Endo/Other    Renal/GU      Musculoskeletal   Abdominal   Peds  Hematology   Anesthesia Other Findings   Reproductive/Obstetrics                             Anesthesia Physical Anesthesia Plan Anesthesia Quick Evaluation  

## 2015-08-09 NOTE — Addendum Note (Signed)
Addendum  created 08/09/15 1110 by Mickel Baas, CRNA   Modules edited: Anesthesia Flowsheet, Anesthesia Medication Administration, Notes Section   Notes Section:  File: [id number]

## 2015-08-09 NOTE — Anesthesia Preprocedure Evaluation (Signed)
Anesthesia Evaluation  Patient identified by MRN, date of birth, ID band Patient awake    Reviewed: Allergy & Precautions, NPO status , Patient's Chart, lab work & pertinent test results  Airway Mallampati: II  TM Distance: >3 FB     Dental  (+) Edentulous Upper, Poor Dentition   Pulmonary Current Smoker,    breath sounds clear to auscultation       Cardiovascular hypertension, Pt. on medications  Rhythm:Regular Rate:Normal     Neuro/Psych    GI/Hepatic GERD  Medicated,(+)     substance abuse  alcohol use, cocaine use and marijuana use,   Endo/Other    Renal/GU      Musculoskeletal  (+) Arthritis ,   Abdominal   Peds  Hematology   Anesthesia Other Findings   Reproductive/Obstetrics                             Anesthesia Physical Anesthesia Plan  ASA: III  Anesthesia Plan: MAC   Post-op Pain Management:    Induction: Intravenous  Airway Management Planned: Simple Face Mask  Additional Equipment:   Intra-op Plan:   Post-operative Plan:   Informed Consent: I have reviewed the patients History and Physical, chart, labs and discussed the procedure including the risks, benefits and alternatives for the proposed anesthesia with the patient or authorized representative who has indicated his/her understanding and acceptance.     Plan Discussed with:   Anesthesia Plan Comments:         Anesthesia Quick Evaluation

## 2015-08-09 NOTE — Anesthesia Postprocedure Evaluation (Signed)
  Anesthesia Post-op Note  Patient: David Curtis  Procedure(s) Performed: Procedure(s) with comments: COLONOSCOPY WITH PROPOFOL (N/A) - In cecum @ 1035, total withdrawal time - 15 minutes POLYPECTOMY (N/A)  Patient Location: PACU  Anesthesia Type:MAC  Level of Consciousness: awake, alert , oriented and patient cooperative  Airway and Oxygen Therapy: Patient Spontanous Breathing and Patient connected to nasal cannula oxygen  Post-op Pain: mild  Post-op Assessment: Post-op Vital signs reviewed, Patient's Cardiovascular Status Stable, Respiratory Function Stable, Patent Airway, No signs of Nausea or vomiting and Pain level controlled              Post-op Vital Signs: Reviewed and stable  Last Vitals:  Filed Vitals:   08/09/15 1020  BP: 164/88  Temp:   Resp: 21    Complications: No apparent anesthesia complications

## 2015-08-09 NOTE — Progress Notes (Addendum)
Patient discharged from Short Stay at 1241 with girlfriend, Ulis Rias. Patient did not appear to be in any stress. Walked out. VSS. Spoke to Dr. Kathreen Cornfield telephone twice and in personal twice- concerning patient's pain level. Explained pain chart and levels to patient and Margaret,girlfriend, gave Mylicon as ordered, ambulated patient in hall, and had patient use the restroom. Patient stated that pain level had dropped to a tolerable three. He stated that when he had his last colonoscopy that he had same intermittent pain for several days. Abdomen soft. Patient appeared to be fine. Requesting to leave. Left per Dr. Roseanne Kaufman approval.

## 2015-08-09 NOTE — Interval H&P Note (Signed)
History and Physical Interval Note:  08/09/2015 10:16 AM  David Curtis  has presented today for surgery, with the diagnosis of rectal bleeding, weight loss  The various methods of treatment have been discussed with the patient and family. After consideration of risks, benefits and other options for treatment, the patient has consented to  Procedure(s) with comments: COLONOSCOPY WITH PROPOFOL (N/A) - 1045  as a surgical intervention .  The patient's history has been reviewed, patient examined, no change in status, stable for surgery.  I have reviewed the patient's chart and labs.  Questions were answered to the patient's satisfaction.   No change The risks, benefits, limitations, alternatives and imponderables have been reviewed with the patient. Questions have been answered. All parties are agreeable.   Manus Rudd

## 2015-08-09 NOTE — H&P (View-Only) (Signed)
  Referring Provider: Belfry, Kawanta F, MD Primary Care Physician:  Hopedale, KAWANTA, MD Primary GI:  Dr. Rourk  Chief Complaint  Patient presents with  . Colonoscopy    HPI:   54-year-old male presents to schedule colonoscopy for rectal bleeding and loss of weight. He was previously seen on 02/14/2015 and arrange for colonoscopy in the OR with propofol. The procedure had to be rescheduled due to the patient having to go out of town for work on the date initially scheduled. He has a history of drug use and will need a urine drug screen prior to the procedure. Per telephone note on 06/22/2015 his PCP ordered a UDS which was negative.   Today he states he's doing the same. Continued rectal bleeding for the past month, sometimes heavier than others. Admits generalized abdominal pain which is crampy. Denies N/V, stools occasionally loose. Denies chest pain, dyspnea, dizziness, lightheadedness, syncope, near syncope. No further weight loss from last visit. Denies any other upper or lower GI symptoms.  Past Medical History  Diagnosis Date  . Hypertension   . GERD (gastroesophageal reflux disease)   . Arthritis     Past Surgical History  Procedure Laterality Date  . Appendectomy  2011  . Colonoscopy      Approx 2000 in [place], [place]; infectious colitis, no polyps/masses (per patient; records not available)  . Joint replacement Right     ankle- has been broken 4x    Current Outpatient Prescriptions  Medication Sig Dispense Refill  . HYDROcodone-acetaminophen (NORCO/VICODIN) 5-325 MG per tablet Take 1 tablet by mouth 2 (two) times daily as needed. 20 tablet 0  . lisinopril (PRINIVIL,ZESTRIL) 40 MG tablet Take 1 tablet (40 mg total) by mouth daily. 30 tablet 6  . pantoprazole (PROTONIX) 40 MG tablet Take 40 mg by mouth daily.    . polyethylene glycol-electrolytes (NULYTELY/GOLYTELY) 420 G solution Take 4,000 mLs by mouth once. (Patient not taking: Reported on 06/19/2015) 4000 mL 0   No  current facility-administered medications for this visit.    Allergies as of 07/18/2015  . (No Known Allergies)    Family History  Problem Relation Age of Onset  . Arthritis Mother   . Cancer Mother     unknown type  . Diabetes Mother   . Hypertension Mother   . Arthritis Sister   . Cancer Sister     unknown type, possibly breast CA  . COPD Brother   . Arthritis Sister   . Arthritis Sister   . Arthritis Sister   . Colon cancer Neg Hx   . Pancreatic cancer Neg Hx     Social History   Social History  . Marital Status: Single    Spouse Name: N/A  . Number of Children: N/A  . Years of Education: N/A   Social History Main Topics  . Smoking status: Current Every Day Smoker -- 1.50 packs/day for 40 years    Types: Cigarettes  . Smokeless tobacco: Former User  . Alcohol Use: 7.2 oz/week    12 Cans of beer, 0 Standard drinks or equivalent per week     Comment: varies in amount, sometimes a pint a day, will sometimes go a week without.  . Drug Use: Yes    Special: Marijuana, Cocaine     Comment: Last cocaine was about 1 week ago.  . Sexual Activity: Yes   Other Topics Concern  . None   Social History Narrative    Review of Systems: General: Negative for anorexia,   weight loss, fever, chills, fatigue, weakness. Eyes: Negative for vision changes.  ENT: Negative for hoarseness, difficulty swallowing , nasal congestion. CV: Negative for chest pain, angina, palpitations, dyspnea on exertion, peripheral edema.  Respiratory: Negative for dyspnea at rest, dyspnea on exertion, cough, sputum, wheezing.  GI: See history of present illness. GU:  Negative for dysuria, hematuria, urinary incontinence, urinary frequency, nocturnal urination.  MS: Negative for joint pain, low back pain.  Derm: Negative for rash or itching.  Neuro: Negative for weakness, abnormal sensation, seizure, frequent headaches, memory loss, confusion.  Psych: Negative for anxiety, depression, suicidal  ideation, hallucinations.  Endo: Negative for unusual weight change.  Heme: Negative for bruising or bleeding. Allergy: Negative for rash or hives.   Physical Exam: BP 103/60 mmHg  Pulse 70  Temp(Src) 97.6 F (36.4 C) (Oral)  Ht 5' 8" (1.727 m)  Wt 158 lb (71.668 kg)  BMI 24.03 kg/m2 General:   Alert and oriented. Pleasant and cooperative. Well-nourished and well-developed.  Head:  Normocephalic and atraumatic. Eyes:  Without icterus, sclera clear and conjunctiva pink.  Ears:  Normal auditory acuity. Mouth:  No deformity or lesions, oral mucosa pink.  Throat/Neck:  Supple, without mass or thyromegaly. Cardiovascular:  S1, S2 present without murmurs appreciated. Normal pulses noted. Extremities without clubbing or edema. Respiratory:  Clear to auscultation bilaterally. No wheezes, rales, or rhonchi. No distress.  Gastrointestinal:  +BS, soft, non-tender and non-distended. No HSM noted. No guarding or rebound. No masses appreciated.  Rectal:  Deferred  Musculoskalatal:  Symmetrical without gross deformities. Normal posture. Skin:  Intact without significant lesions or rashes. Neurologic:  Alert and oriented x4;  grossly normal neurologically. Psych:  Alert and cooperative. Normal mood and affect. Heme/Lymph/Immune: No significant cervical adenopathy. No excessive bruising noted.    07/18/2015 2:41 PM  

## 2015-08-09 NOTE — Discharge Instructions (Signed)
Colonoscopy Discharge Instructions  Read the instructions outlined below and refer to this sheet in the next few weeks. These discharge instructions provide you with general information on caring for yourself after you leave the hospital. Your doctor may also give you specific instructions. While your treatment has been planned according to the most current medical practices available, unavoidable complications occasionally occur. If you have any problems or questions after discharge, call Dr. Gala Romney at [phone]. ACTIVITY  You may resume your regular activity, but move at a slower pace for the next 24 hours.   Take frequent rest periods for the next 24 hours.   Walking will help get rid of the air and reduce the bloated feeling in your belly (abdomen).   No driving for 24 hours (because of the medicine (anesthesia) used during the test).    Do not sign any important legal documents or operate any machinery for 24 hours (because of the anesthesia used during the test).  NUTRITION  Drink plenty of fluids.   You may resume your normal diet as instructed by your doctor.   Begin with a light meal and progress to your normal diet. Heavy or fried foods are harder to digest and may make you feel sick to your stomach (nauseated).   Avoid alcoholic beverages for 24 hours or as instructed.  MEDICATIONS  You may resume your normal medications unless your doctor tells you otherwise.  WHAT YOU CAN EXPECT TODAY  Some feelings of bloating in the abdomen.   Passage of more gas than usual.   Spotting of blood in your stool or on the toilet paper.  IF YOU HAD POLYPS REMOVED DURING THE COLONOSCOPY:  No aspirin products for 7 days or as instructed.   No alcohol for 7 days or as instructed.   Eat a soft diet for the next 24 hours.  FINDING OUT THE RESULTS OF YOUR TEST Not all test results are available during your visit. If your test results are not back during the visit, make an appointment  with your caregiver to find out the results. Do not assume everything is normal if you have not heard from your caregiver or the medical facility. It is important for you to follow up on all of your test results.  SEEK IMMEDIATE MEDICAL ATTENTION IF:  You have more than a spotting of blood in your stool.   Your belly is swollen (abdominal distention).   You are nauseated or vomiting.   You have a temperature over 101.   You have abdominal pain or discomfort that is severe or gets worse throughout the day.   Hemorrhoid and polyp information provided  Further recommendations to follow pending review of pathology report  Office visit with [place] in 4 weeks with Neil Crouch at Dr. Roseanne Kaufman office at 11:00 on September 06, 2015  Hemorrhoids Hemorrhoids are swollen veins around the rectum or anus. There are two types of hemorrhoids:   Internal hemorrhoids. These occur in the veins just inside the rectum. They may poke through to the outside and become irritated and painful.  External hemorrhoids. These occur in the veins outside the anus and can be felt as a painful swelling or hard lump near the anus. CAUSES  Pregnancy.   Obesity.   Constipation or diarrhea.   Straining to have a bowel movement.   Sitting for long periods on the toilet.  Heavy lifting or other activity that caused you to strain.  Anal intercourse. SYMPTOMS   Pain.  Anal itching or irritation.   Rectal bleeding.   Fecal leakage.   Anal swelling.   One or more lumps around the anus.  DIAGNOSIS  Your caregiver may be able to diagnose hemorrhoids by visual examination. Other examinations or tests that may be performed include:   Examination of the rectal area with a gloved hand (digital rectal exam).   Examination of anal canal using a small tube (scope).   A blood test if you have lost a significant amount of blood.  A test to look inside the colon (sigmoidoscopy or  colonoscopy). TREATMENT Most hemorrhoids can be treated at home. However, if symptoms do not seem to be getting better or if you have a lot of rectal bleeding, your caregiver may perform a procedure to help make the hemorrhoids get smaller or remove them completely. Possible treatments include:   Placing a rubber band at the base of the hemorrhoid to cut off the circulation (rubber band ligation).   Injecting a chemical to shrink the hemorrhoid (sclerotherapy).   Using a tool to burn the hemorrhoid (infrared light therapy).   Surgically removing the hemorrhoid (hemorrhoidectomy).   Stapling the hemorrhoid to block blood flow to the tissue (hemorrhoid stapling).  HOME CARE INSTRUCTIONS   Eat foods with fiber, such as whole grains, beans, nuts, fruits, and vegetables. Ask your doctor about taking products with added fiber in them (fibersupplements).  Increase fluid intake. Drink enough water and fluids to keep your urine clear or pale yellow.   Exercise regularly.   Go to the bathroom when you have the urge to have a bowel movement. Do not wait.   Avoid straining to have bowel movements.   Keep the anal area dry and clean. Use wet toilet paper or moist towelettes after a bowel movement.   Medicated creams and suppositories may be used or applied as directed.   Only take over-the-counter or prescription medicines as directed by your caregiver.   Take warm sitz baths for 15-20 minutes, 3-4 times a day to ease pain and discomfort.   Place ice packs on the hemorrhoids if they are tender and swollen. Using ice packs between sitz baths may be helpful.   Put ice in a plastic bag.   Place a towel between your skin and the bag.   Leave the ice on for 15-20 minutes, 3-4 times a day.   Do not use a donut-shaped pillow or sit on the toilet for long periods. This increases blood pooling and pain.  SEEK MEDICAL CARE IF:  You have increasing pain and swelling that is not  controlled by treatment or medicine.  You have uncontrolled bleeding.  You have difficulty or you are unable to have a bowel movement.  You have pain or inflammation outside the area of the hemorrhoids. MAKE SURE YOU:  Understand these instructions.  Will watch your condition.  Will get help right away if you are not doing well or get worse.   Colon Polyps Polyps are lumps of extra tissue growing inside the body. Polyps can grow in the large intestine (colon). Most colon polyps are noncancerous (benign). However, some colon polyps can become cancerous over time. Polyps that are larger than a pea may be harmful. To be safe, caregivers remove and test all polyps. CAUSES  Polyps form when mutations in the genes cause your cells to grow and divide even though no more tissue is needed. RISK FACTORS There are a number of risk factors that can increase your chances  of getting colon polyps. They include:  Being older than 50 years.  Family history of colon polyps or colon cancer.  Long-term colon diseases, such as colitis or Crohn disease.  Being overweight.  Smoking.  Being inactive.  Drinking too much alcohol. SYMPTOMS  Most small polyps do not cause symptoms. If symptoms are present, they may include:  Blood in the stool. The stool may look dark red or black.  Constipation or diarrhea that lasts longer than 1 week. DIAGNOSIS People often do not know they have polyps until their caregiver finds them during a regular checkup. Your caregiver can use 4 tests to check for polyps:  Digital rectal exam. The caregiver wears gloves and feels inside the rectum. This test would find polyps only in the rectum.  Barium enema. The caregiver puts a liquid called barium into your rectum before taking X-rays of your colon. Barium makes your colon look white. Polyps are dark, so they are easy to see in the X-ray pictures.  Sigmoidoscopy. A thin, flexible tube (sigmoidoscope) is placed into  your rectum. The sigmoidoscope has a light and tiny camera in it. The caregiver uses the sigmoidoscope to look at the last third of your colon.  Colonoscopy. This test is like sigmoidoscopy, but the caregiver looks at the entire colon. This is the most common method for finding and removing polyps. TREATMENT  Any polyps will be removed during a sigmoidoscopy or colonoscopy. The polyps are then tested for cancer. PREVENTION  To help lower your risk of getting more colon polyps:  Eat plenty of fruits and vegetables. Avoid eating fatty foods.  Do not smoke.  Avoid drinking alcohol.  Exercise every day.  Lose weight if recommended by your caregiver.  Eat plenty of calcium and folate. Foods that are rich in calcium include milk, cheese, and broccoli. Foods that are rich in folate include chickpeas, kidney beans, and spinach. HOME CARE INSTRUCTIONS Keep all follow-up appointments as directed by your caregiver. You may need periodic exams to check for polyps. SEEK MEDICAL CARE IF: You notice bleeding during a bowel movement.

## 2015-08-09 NOTE — Op Note (Signed)
[facility] [address] [place], [address]   COLONOSCOPY PROCEDURE REPORT  PATIENT: David Curtis, David Curtis  MR#: [id number] BIRTHDATE: January 13, 1961 , 37  yrs. old GENDER: male ENDOSCOPIST: R.  Garfield Cornea, MD FACP [facility] REFERRED BV:APOLIDC Rutledge, M.D. PROCEDURE DATE:  2015-08-28 PROCEDURE:   Colonoscopy with biopsy and with snare polypectomy INDICATIONS:Paper hematochezia. MEDICATIONS: Deep sedation per Dr.  [facility] ASA CLASS:       Class II  CONSENT: The risks, benefits, alternatives and imponderables including but not limited to bleeding, perforation as well as the possibility of a missed lesion have been reviewed.  The potential for biopsy, lesion removal, etc. have also been discussed. Questions have been answered.  All parties agreeable.  Please see the history and physical in the medical record for more information.  DESCRIPTION OF PROCEDURE:   After the risks benefits and alternatives of the procedure were thoroughly explained, informed consent was obtained.  The digital rectal exam revealed no abnormalities of the rectum.   The     endoscope was introduced through the anus and advanced to the cecum, which was identified by both the appendix and ileocecal valve. No adverse events experienced.   The quality of the prep was adequate  The instrument was then slowly withdrawn as the colon was fully examined. Estimated blood loss is zero unless otherwise noted in this procedure report.      COLON FINDINGS: Internal hemorrhoids; otherwise normal appearing rectal mucosa.  (1) diminutive polyp in the base the cecum.  (1)5 mm polyp in the descending segment and one more in the sigmoid segment; otherwise, the remainder of the colonic mucosa.  Normal.  The above-mentioned polyps were cold biopsy removed, cold snare removal -2, respectively.  Retroflexion was performed. .  Withdrawal time=15 minutes 0 seconds.  The scope was withdrawn and the  procedure completed. COMPLICATIONS: There were no immediate complications. EBL 5 mL ENDOSCOPIC IMPRESSION: Internal hemorrhoids?"likely source of occasional hematochezia. Colonic polyps?"removed as described above  RECOMMENDATIONS: Office visit with [place] in 4 weeks. Follow up on pathology.  eSigned:  R. Garfield Cornea, MD Rosalita Chessman [facility] 2015-08-28 10:58 AM   cc:  CPT CODES: ICD CODES:  The ICD and CPT codes recommended by this software are interpretations from the data that the clinical staff has captured with the software.  The verification of the translation of this report to the ICD and CPT codes and modifiers is the sole responsibility of the health care institution and practicing physician where this report was generated.  [employer] will not be held responsible for the validity of the ICD and CPT codes included on this report.  AMA assumes no liability for data contained or not contained herein. CPT is a Designer, television/film set of the [employer].  PATIENT NAME:  David Curtis, David Curtis MR#: [id number]

## 2015-08-09 NOTE — Transfer of Care (Signed)
Immediate Anesthesia Transfer of Care Note  Patient: David Curtis  Procedure(s) Performed: Procedure(s) with comments: COLONOSCOPY WITH PROPOFOL (N/A) - In cecum @ 1035, total withdrawal time - 15 minutes POLYPECTOMY (N/A)  Patient Location: PACU  Anesthesia Type:MAC  Level of Consciousness: awake, alert , oriented and patient cooperative  Airway & Oxygen Therapy: Patient Spontanous Breathing and Patient connected to nasal cannula oxygen  Post-op Assessment: Report given to RN and Post -op Vital signs reviewed and stable  Post vital signs: Reviewed and stable  Last Vitals:  Filed Vitals:   08/09/15 1020  BP: 164/88  Temp:   Resp: 21    Complications: No apparent anesthesia complications

## 2015-08-09 NOTE — Anesthesia Postprocedure Evaluation (Signed)
  Anesthesia Post-op Note  Patient: David Curtis  Procedure(s) Performed: Procedure(s) with comments: COLONOSCOPY WITH PROPOFOL (N/A) - In cecum @ 1035, total withdrawal time - 15 minutes POLYPECTOMY (N/A)  Patient Location: PACU  Anesthesia Type:MAC  Level of Consciousness: awake, alert , oriented and patient cooperative  Airway and Oxygen Therapy: Patient Spontanous Breathing and Patient connected to nasal cannula oxygen  Post-op Pain: mild  Post-op Assessment: Post-op Vital signs reviewed, Patient's Cardiovascular Status Stable, Respiratory Function Stable, Patent Airway and No signs of Nausea or vomiting              Post-op Vital Signs: Reviewed and stable  Last Vitals:  Filed Vitals:   08/09/15 1020  BP: 164/88  Temp:   Resp: 21    Complications: No apparent anesthesia complications

## 2015-08-10 ENCOUNTER — Encounter: Payer: Self-pay | Admitting: Internal Medicine

## 2015-08-10 ENCOUNTER — Encounter (HOSPITAL_COMMUNITY): Payer: Self-pay | Admitting: Internal Medicine

## 2015-08-16 ENCOUNTER — Other Ambulatory Visit: Payer: Self-pay | Admitting: *Deleted

## 2015-08-16 MED ORDER — LISINOPRIL 40 MG PO TABS: 40.0000 mg | ORAL_TABLET | Freq: Every day | ORAL | Status: DC

## 2015-08-16 NOTE — Telephone Encounter (Signed)
Received fax requesting refill on lisinopril with 90 day supply.   Refill appropriate and filled per protocol.

## 2015-09-03 ENCOUNTER — Other Ambulatory Visit: Payer: Self-pay | Admitting: Family Medicine

## 2015-09-03 NOTE — Telephone Encounter (Signed)
Pt needs a refill of pain medication.   [phone]

## 2015-09-04 MED ORDER — HYDROCODONE-ACETAMINOPHEN 5-325 MG PO TABS: 1.0000 | ORAL_TABLET | Freq: Two times a day (BID) | ORAL | Status: DC | PRN

## 2015-09-04 NOTE — Telephone Encounter (Signed)
Pain medication refilled

## 2015-09-06 ENCOUNTER — Ambulatory Visit: Payer: Medicare Other | Admitting: Gastroenterology

## 2015-10-02 ENCOUNTER — Telehealth: Payer: Self-pay | Admitting: Family Medicine

## 2015-10-02 NOTE — Telephone Encounter (Signed)
Patient requesting rx for her hydrocodone [phone]

## 2015-10-02 NOTE — Telephone Encounter (Signed)
Ok to refill??  Last office visit 06/19/2015.  Last refill 09/04/2015.

## 2015-10-02 NOTE — Telephone Encounter (Signed)
Okay to refill, schedule OV

## 2015-10-03 MED ORDER — HYDROCODONE-ACETAMINOPHEN 5-325 MG PO TABS: 1.0000 | ORAL_TABLET | Freq: Two times a day (BID) | ORAL | Status: DC | PRN

## 2015-10-03 NOTE — Telephone Encounter (Signed)
Prescription printed and patient made aware to come to office to pick up.   Appointment scheduled.  

## 2015-10-09 ENCOUNTER — Ambulatory Visit: Payer: Self-pay | Admitting: Family Medicine

## 2015-10-22 ENCOUNTER — Telehealth: Payer: Self-pay | Admitting: Nurse Practitioner

## 2015-10-22 ENCOUNTER — Encounter: Payer: Self-pay | Admitting: Nurse Practitioner

## 2015-10-22 ENCOUNTER — Ambulatory Visit: Payer: Medicare Other | Admitting: Nurse Practitioner

## 2015-10-22 NOTE — Telephone Encounter (Signed)
PT WAS A NO SHOW AND LETTER SENT  °

## 2015-10-23 NOTE — Telephone Encounter (Signed)
Noted  

## 2015-10-30 ENCOUNTER — Telehealth: Payer: Self-pay | Admitting: Family Medicine

## 2015-10-30 NOTE — Telephone Encounter (Signed)
Needs appt before refill

## 2015-10-30 NOTE — Telephone Encounter (Signed)
Ok to refill??  Last office visit 06/19/2015. Patient NS for scheduled appt on 10/09/2015.  Last refill 10/02/2015.

## 2015-10-30 NOTE — Telephone Encounter (Signed)
Call placed to patient and patient made aware.   Appointment scheduled for 11/06/2015 @ 4 pm.

## 2015-10-30 NOTE — Telephone Encounter (Signed)
Needs rx for his hydrocodone  [phone]

## 2015-11-06 ENCOUNTER — Encounter: Payer: Self-pay | Admitting: Family Medicine

## 2015-11-06 ENCOUNTER — Ambulatory Visit (INDEPENDENT_AMBULATORY_CARE_PROVIDER_SITE_OTHER): Payer: Medicare Other | Admitting: Family Medicine

## 2015-11-06 VITALS — BP 142/74 | HR 68 | Temp 99.3°F | Resp 18 | Ht 68.0 in | Wt 164.0 lb

## 2015-11-06 DIAGNOSIS — J0100 Acute maxillary sinusitis, unspecified: Secondary | ICD-10-CM

## 2015-11-06 DIAGNOSIS — I10 Essential (primary) hypertension: Secondary | ICD-10-CM

## 2015-11-06 DIAGNOSIS — R112 Nausea with vomiting, unspecified: Secondary | ICD-10-CM | POA: Diagnosis not present

## 2015-11-06 DIAGNOSIS — F17200 Nicotine dependence, unspecified, uncomplicated: Secondary | ICD-10-CM

## 2015-11-06 DIAGNOSIS — R5081 Fever presenting with conditions classified elsewhere: Secondary | ICD-10-CM | POA: Diagnosis not present

## 2015-11-06 DIAGNOSIS — J209 Acute bronchitis, unspecified: Secondary | ICD-10-CM | POA: Diagnosis not present

## 2015-11-06 LAB — INFLUENZA A AND B
INFLUENZA A AG: NEGATIVE
Influenza B Ag: NEGATIVE

## 2015-11-06 MED ORDER — DOXYCYCLINE HYCLATE 100 MG PO TABS: 100.0000 mg | ORAL_TABLET | Freq: Two times a day (BID) | ORAL | Status: DC

## 2015-11-06 MED ORDER — HYDROCODONE-ACETAMINOPHEN 5-325 MG PO TABS: 1.0000 | ORAL_TABLET | Freq: Two times a day (BID) | ORAL | Status: DC | PRN

## 2015-11-06 MED ORDER — GUAIFENESIN-CODEINE 100-10 MG/5ML PO SOLN: 5.0000 mL | Freq: Four times a day (QID) | ORAL | Status: DC | PRN

## 2015-11-06 MED ORDER — PROMETHAZINE HCL 25 MG/ML IJ SOLN
25.0000 mg | Freq: Once | INTRAMUSCULAR | Status: AC
  Administered 2015-11-06: 25 mg via INTRAMUSCULAR

## 2015-11-06 NOTE — Assessment & Plan Note (Signed)
BP a little elevated today, however ill, will continue lisinopril, recheck at CPE with labs

## 2015-11-06 NOTE — Patient Instructions (Signed)
Take antibiotics as prescribed Take cough medicine Plenty of fluids F/U 3 months for physical

## 2015-11-06 NOTE — Progress Notes (Signed)
Patient ID: David Curtis, male   DOB: January 08, 1961, 54 y.o.   MRN: [id number]   Subjective:    Patient ID: David Curtis, male    DOB: 1961/05/16, 54 y.o.   MRN: [id number]  Patient presents for F/U and Illness   Pt here with cough with production, sinus pressure drainage low grade fever, post tussive emesis, body aches for past 2 weeks it has been worsening. Positive sick contacts. No diarrhea. Had nausea today. No OTC medications taken.   He did not have colonoscopy had polyps removed, rectal bleeding has resolved.     UDS was negative.    Review Of Systems:  GEN- denies fatigue, fever, weight loss,weakness, recent illness HEENT- denies eye drainage, change in vision, +nasal discharge, CVS- denies chest pain, palpitations RESP- denies SOB, +cough, wheeze ABD- denies N/V, change in stools, abd pain GU- denies dysuria, hematuria, dribbling, incontinence MSK- +joint pain, muscle aches, injury Neuro- denies headache, dizziness, syncope, seizure activity       Objective:    BP 142/74 mmHg  Pulse 68  Temp(Src) 99.3 F (37.4 C) (Oral)  Resp 18  Ht 5\' 8"  (1.727 m)  Wt 164 lb (74.39 kg)  BMI 24.94 kg/m2  SpO2 96% GEN- NAD, alert and oriented x3,ill appearing HEENT- PERRL, EOMI, non injected sclera, pink conjunctiva, MMM, oropharynx mild injection, TM clear bilat no effusion,  + maxillary sinus tenderness, inflammed turbinates,  Nasal drainage  Neck- Supple, no LAD CVS- RRR, no murmur RESP-CTAB ABD-NABS,soft, NT,ND  EXT- No edema Pulses- Radial 2+  FLU NEGATIVE        Assessment & Plan:      Problem List Items Addressed This Visit    Tobacco use disorder   Essential hypertension    BP a little elevated today, however ill, will continue lisinopril, recheck at CPE with labs       Other Visit Diagnoses    Acute bronchitis, unspecified organism    -  Primary    Give phenergan due to nausea, Treat with antibiotics, robitussin codiene, advised no pain meds and cough  syrup at the same time.     Relevant Orders    Influenza a and b (Completed)    Acute maxillary sinusitis, recurrence not specified        Nasal saline    Relevant Medications    doxycycline (VIBRA-TABS) 100 MG tablet    guaiFENesin-codeine 100-10 MG/5ML syrup    promethazine (PHENERGAN) injection 25 mg (Completed)    Nausea and vomiting, intractability of vomiting not specified, unspecified vomiting type        Relevant Medications    promethazine (PHENERGAN) injection 25 mg (Completed)       Note: This dictation was prepared with Dragon dictation along with smaller phrase technology. Any transcriptional errors that result from this process are unintentional.

## 2015-12-04 ENCOUNTER — Telehealth: Payer: Self-pay | Admitting: Family Medicine

## 2015-12-04 MED ORDER — HYDROCODONE-ACETAMINOPHEN 5-325 MG PO TABS: 1.0000 | ORAL_TABLET | Freq: Two times a day (BID) | ORAL | Status: DC | PRN

## 2015-12-04 NOTE — Telephone Encounter (Signed)
Pt requests a refill of Hydrocodone [phone] or [phone]

## 2015-12-04 NOTE — Telephone Encounter (Signed)
Ok to refill??  Last office visit/ refill 11/06/2015. 

## 2015-12-04 NOTE — Telephone Encounter (Signed)
Okay to refill? 

## 2015-12-04 NOTE — Telephone Encounter (Signed)
Prescription printed and patient made aware to come to office to pick up after 2pm on 12/04/2015.

## 2015-12-31 ENCOUNTER — Emergency Department (HOSPITAL_COMMUNITY)
Admission: EM | Admit: 2015-12-31 | Discharge: 2015-12-31 | Disposition: A | Discharge status: Home / Self Care | Payer: Medicare Other | Attending: Emergency Medicine | Admitting: Emergency Medicine

## 2015-12-31 ENCOUNTER — Telehealth: Payer: Self-pay | Admitting: Family Medicine

## 2015-12-31 ENCOUNTER — Encounter (HOSPITAL_COMMUNITY): Payer: Self-pay | Admitting: Emergency Medicine

## 2015-12-31 DIAGNOSIS — R350 Frequency of micturition: Secondary | ICD-10-CM | POA: Insufficient documentation

## 2015-12-31 DIAGNOSIS — Z792 Long term (current) use of antibiotics: Secondary | ICD-10-CM | POA: Insufficient documentation

## 2015-12-31 DIAGNOSIS — F1010 Alcohol abuse, uncomplicated: Secondary | ICD-10-CM | POA: Diagnosis not present

## 2015-12-31 DIAGNOSIS — R3915 Urgency of urination: Secondary | ICD-10-CM | POA: Diagnosis not present

## 2015-12-31 DIAGNOSIS — K921 Melena: Secondary | ICD-10-CM | POA: Insufficient documentation

## 2015-12-31 DIAGNOSIS — F17210 Nicotine dependence, cigarettes, uncomplicated: Secondary | ICD-10-CM | POA: Insufficient documentation

## 2015-12-31 DIAGNOSIS — Z79899 Other long term (current) drug therapy: Secondary | ICD-10-CM | POA: Insufficient documentation

## 2015-12-31 DIAGNOSIS — M1990 Unspecified osteoarthritis, unspecified site: Secondary | ICD-10-CM | POA: Diagnosis not present

## 2015-12-31 DIAGNOSIS — K219 Gastro-esophageal reflux disease without esophagitis: Secondary | ICD-10-CM | POA: Diagnosis not present

## 2015-12-31 DIAGNOSIS — R531 Weakness: Secondary | ICD-10-CM | POA: Diagnosis not present

## 2015-12-31 DIAGNOSIS — I10 Essential (primary) hypertension: Secondary | ICD-10-CM | POA: Diagnosis not present

## 2015-12-31 LAB — COMPREHENSIVE METABOLIC PANEL
ALT: 15 U/L — ABNORMAL LOW (ref 17–63)
ANION GAP: 9 (ref 5–15)
AST: 20 U/L (ref 15–41)
Albumin: 4.1 g/dL (ref 3.5–5.0)
Alkaline Phosphatase: 64 U/L (ref 38–126)
BILIRUBIN TOTAL: 0.4 mg/dL (ref 0.3–1.2)
BUN: 7 mg/dL (ref 6–20)
CHLORIDE: 105 mmol/L (ref 101–111)
CO2: 25 mmol/L (ref 22–32)
Calcium: 9.5 mg/dL (ref 8.9–10.3)
Creatinine, Ser: 0.93 mg/dL (ref 0.61–1.24)
Glucose, Bld: 107 mg/dL — ABNORMAL HIGH (ref 65–99)
POTASSIUM: 3.7 mmol/L (ref 3.5–5.1)
Sodium: 139 mmol/L (ref 135–145)
TOTAL PROTEIN: 7.5 g/dL (ref 6.5–8.1)

## 2015-12-31 LAB — TYPE AND SCREEN
ABO/RH(D): O POS
ANTIBODY SCREEN: NEGATIVE

## 2015-12-31 LAB — URINALYSIS, ROUTINE W REFLEX MICROSCOPIC
BILIRUBIN URINE: NEGATIVE
GLUCOSE, UA: NEGATIVE mg/dL
Hgb urine dipstick: NEGATIVE
KETONES UR: NEGATIVE mg/dL
LEUKOCYTES UA: NEGATIVE
NITRITE: NEGATIVE
PH: 5 (ref 5.0–8.0)
PROTEIN: NEGATIVE mg/dL
Specific Gravity, Urine: 1.02 (ref 1.005–1.030)

## 2015-12-31 LAB — CBC
HEMATOCRIT: 41.7 % (ref 39.0–52.0)
Hemoglobin: 14 g/dL (ref 13.0–17.0)
MCH: 31.5 pg (ref 26.0–34.0)
MCHC: 33.6 g/dL (ref 30.0–36.0)
MCV: 93.9 fL (ref 78.0–100.0)
Platelets: 316 10*3/uL (ref 150–400)
RBC: 4.44 MIL/uL (ref 4.22–5.81)
RDW: 13.2 % (ref 11.5–15.5)
WBC: 6.5 10*3/uL (ref 4.0–10.5)

## 2015-12-31 LAB — POC OCCULT BLOOD, ED: FECAL OCCULT BLD: NEGATIVE

## 2015-12-31 MED ORDER — LISINOPRIL 40 MG PO TABS: 40.0000 mg | ORAL_TABLET | Freq: Every day | ORAL | Status: DC

## 2015-12-31 MED ORDER — HYDROCODONE-ACETAMINOPHEN 5-325 MG PO TABS: 1.0000 | ORAL_TABLET | Freq: Two times a day (BID) | ORAL | Status: DC | PRN

## 2015-12-31 NOTE — Telephone Encounter (Signed)
Prescription printed and patient made aware to come to office to pick up on 01/01/2016. 

## 2015-12-31 NOTE — ED Notes (Signed)
Patient given discharge instruction, verbalized understand. Patient ambulatory out of the department.  

## 2015-12-31 NOTE — ED Notes (Signed)
Hemoccult  Set up, pt complaining of rectal bleed and difficulty voiding.

## 2015-12-31 NOTE — Telephone Encounter (Signed)
Ok to refill Norco??  Last office visit 11/06/2015.  Last refill 12/04/2015.

## 2015-12-31 NOTE — Telephone Encounter (Signed)
Okay to refill, but pharmacy will not fill script early for pain meds

## 2015-12-31 NOTE — Telephone Encounter (Signed)
[facility]  [phone] Patient says that he went out of town and luggage was lost, he needs rx of his lisinopril called in and needs to if possible pick up rx for his hydrocodone, would like this done if possible by 2 today

## 2015-12-31 NOTE — ED Provider Notes (Addendum)
CSN: [id number]     Arrival date & time 12/31/15  1336 History   None    Chief Complaint  Patient presents with  . Rectal Bleeding     (Consider location/radiation/quality/duration/timing/severity/associated sxs/prior Treatment) HPI Patient reports that he's been urinating small frequent amounts for approximately the past 5 days. He last urinated in the triage area, a small amount however he does not feel the urge to urinate presently. He also reports rectal bleeding for approximately 2 weeks states he had to bright red bloody stools yesterday and today. Associated symptoms include mild generalized weakness and diffuse abdominal pain. All symptoms are improved presently. He is presently hungry. He vomited one time yesterday. No nausea present no treatment prior to coming here. No known fever. He admits to drinking beer earlier today. No other associated symptoms no treatment prior to coming here. Symptoms of generalized weakness improving steadily with time. Past Medical History  Diagnosis Date  . Hypertension   . GERD (gastroesophageal reflux disease)   . Arthritis    Past Surgical History  Procedure Laterality Date  . Appendectomy  2011  . Colonoscopy      Approx 2000 in [place], [place]; infectious colitis, no polyps/masses (per patient; records not available)  . Joint replacement Right     ankle- has been broken 4x  . Colonoscopy with propofol N/A 08/09/2015    [id number] hemorrhoids  . Polypectomy N/A 08/09/2015    Procedure: POLYPECTOMY;  Surgeon: Daneil Dolin, MD;  Location: [facility];  Service: Endoscopy;  Laterality: N/A;   Family History  Problem Relation Age of Onset  . Arthritis Mother   . Cancer Mother     unknown type  . Diabetes Mother   . Hypertension Mother   . Arthritis Sister   . Cancer Sister     unknown type, possibly breast CA  . COPD Brother   . Arthritis Sister   . Arthritis Sister   . Arthritis Sister   . Colon cancer Neg Hx   . Pancreatic cancer Neg Hx     Social History  Substance Use Topics  . Smoking status: Current Every Day Smoker -- 3.00 packs/day for 40 years    Types: Cigarettes  . Smokeless tobacco: Former Systems developer  . Alcohol Use: 7.2 oz/week    12 Cans of beer, 0 Standard drinks or equivalent per week     Comment: 1/2 pint and six pack a day.  positive smoker. Positive cocaine use no IV drug use. Admits to heavy alcohol use  Review of Systems  Gastrointestinal: Positive for blood in stool.  Genitourinary: Positive for urgency and frequency.  Neurological: Positive for weakness.  All other systems reviewed and are negative.     Allergies  Review of patient's allergies indicates no known allergies.  Home Medications   Prior to Admission medications   Medication Sig Start Date End Date Taking? Authorizing Provider  doxycycline (VIBRA-TABS) 100 MG tablet Take 1 tablet (100 mg total) by mouth 2 (two) times daily. 11/06/15   Alycia Rossetti, MD  guaiFENesin-codeine 100-10 MG/5ML syrup Take 5 mLs by mouth every 6 (six) hours as needed. 11/06/15   Alycia Rossetti, MD  HYDROcodone-acetaminophen (NORCO/VICODIN) 5-325 MG tablet Take 1 tablet by mouth 2 (two) times daily as needed. 12/31/15   Alycia Rossetti, MD  lisinopril (PRINIVIL,ZESTRIL) 40 MG tablet Take 1 tablet (40 mg total) by mouth daily. 12/31/15   Alycia Rossetti, MD  pantoprazole (PROTONIX) 40 MG tablet Take 40 mg  by mouth daily.    Historical Provider, MD  polyethylene glycol-electrolytes (NULYTELY/GOLYTELY) 420 G solution Take 4,000 mLs by mouth once. 07/18/15   Carlis Stable, NP   BP 146/88 mmHg  Pulse 89  Temp(Src) 98.8 F (37.1 C) (Tympanic)  Resp 18  Ht 5\' 8"  (1.727 m)  Wt 172 lb (78.019 kg)  BMI 26.16 kg/m2  SpO2 100% Physical Exam  Constitutional: He is oriented to person, place, and time. He appears well-developed and well-nourished.  HENT:  Head: Normocephalic and atraumatic.  Eyes: Conjunctivae are normal. Pupils are equal, round, and reactive to light.   Neck: Neck supple. No tracheal deviation present. No thyromegaly present.  Cardiovascular: Normal rate and regular rhythm.   No murmur heard. Pulmonary/Chest: Effort normal and breath sounds normal.  Abdominal: Soft. Bowel sounds are normal. He exhibits no distension. There is no tenderness.  Genitourinary: Guaiac positive stool.  Rectal normal tone and brown stool Hemoccult negative  Musculoskeletal: Normal range of motion. He exhibits no edema or tenderness.  Neurological: He is alert and oriented to person, place, and time. No cranial nerve deficit. Coordination normal.  Gait normal not lightheaded on standing  Skin: Skin is warm and dry. No rash noted.  Psychiatric: He has a normal mood and affect.  Nursing note and vitals reviewed.   ED Course  Procedures (including critical care time) Labs Review Labs Reviewed  CBC  COMPREHENSIVE METABOLIC PANEL  URINALYSIS, ROUTINE W REFLEX MICROSCOPIC (NOT AT [facility])  POC OCCULT BLOOD, ED  TYPE AND SCREEN    Imaging Review No results found. I have personally reviewed and evaluated these images and lab results as part of my medical decision-making.   EKG Interpretation None     Patient ate a meal while in the emergency department.. At 3:40 PM he feels well and feels ready to go home. Results for orders placed or performed during the hospital encounter of 12/31/15  Comprehensive metabolic panel  Result Value Ref Range   Sodium 139 135 - 145 mmol/L   Potassium 3.7 3.5 - 5.1 mmol/L   Chloride 105 101 - 111 mmol/L   CO2 25 22 - 32 mmol/L   Glucose, Bld 107 (H) 65 - 99 mg/dL   BUN 7 6 - 20 mg/dL   Creatinine, Ser 0.93 0.61 - 1.24 mg/dL   Calcium 9.5 8.9 - 10.3 mg/dL   Total Protein 7.5 6.5 - 8.1 g/dL   Albumin 4.1 3.5 - 5.0 g/dL   AST 20 15 - 41 U/L   ALT 15 (L) 17 - 63 U/L   Alkaline Phosphatase 64 38 - 126 U/L   Total Bilirubin 0.4 0.3 - 1.2 mg/dL   GFR calc non Af Amer >60 >60 mL/min   GFR calc Af Amer >60 >60 mL/min   Anion  gap 9 5 - 15  CBC  Result Value Ref Range   WBC 6.5 4.0 - 10.5 K/uL   RBC 4.44 4.22 - 5.81 MIL/uL   Hemoglobin 14.0 13.0 - 17.0 g/dL   HCT 41.7 39.0 - 52.0 %   MCV 93.9 78.0 - 100.0 fL   MCH 31.5 26.0 - 34.0 pg   MCHC 33.6 30.0 - 36.0 g/dL   RDW 13.2 11.5 - 15.5 %   Platelets 316 150 - 400 K/uL  Urinalysis, Routine w reflex microscopic (not at [facility])  Result Value Ref Range   Color, Urine YELLOW YELLOW   APPearance CLEAR CLEAR   Specific Gravity, Urine 1.020 1.005 - 1.030  pH 5.0 5.0 - 8.0   Glucose, UA NEGATIVE NEGATIVE mg/dL   Hgb urine dipstick NEGATIVE NEGATIVE   Bilirubin Urine NEGATIVE NEGATIVE   Ketones, ur NEGATIVE NEGATIVE mg/dL   Protein, ur NEGATIVE NEGATIVE mg/dL   Nitrite NEGATIVE NEGATIVE   Leukocytes, UA NEGATIVE NEGATIVE  POC occult blood, ED  Result Value Ref Range   Fecal Occult Bld NEGATIVE NEGATIVE  Type and screen [facility]  Result Value Ref Range   ABO/RH(D) O POS    Antibody Screen NEG    Sample Expiration 01/03/2016    No results found.  MDM  Patient with no evidence of rectal bleeding on exam, with brown Hemoccult negative stool. He has normal BUN , which is not consistent with blood in gut. Hemoglobin is highertoday than 4 months ago. Bladder scan showed 150 mL of urine greater than 1 hour post voiding in the triage area, not consistent with urinary retention I explained to patient that he likely has an alcohol problem and he is in agreement. He has an appointment with his primary care physician in 2 days. He'll also be  To resource guide to get help with his alcohol problem  Final diagnoses:  None   Diagnosis #1 weakness #2 rectal bleeding by history #3 urinary urgency #4 polysubstance abuse     Orlie Dakin, MD 12/31/15 1542  Orlie Dakin, MD 12/31/15 [id number]

## 2015-12-31 NOTE — ED Notes (Signed)
Pt reports urinary retention since yesterday. Voided last night, "few drops" this am, none since.

## 2015-12-31 NOTE — Discharge Instructions (Signed)
Keep your scheduled appointment with your primary care physician in 2 days. Ask your primary care physician or call any of the numbers on the resource guide to get help with your cocaine and alcohol problem. Return if you feel worse for any reason.  Emergency Department Resource Guide 1) Find a Doctor and Pay Out of Pocket Although you won't have to find out who is covered by your insurance plan, it is a good idea to ask around and get recommendations. You will then need to call the office and see if the doctor you have chosen will accept you as a new patient and what types of options they offer for patients who are self-pay. Some doctors offer discounts or will set up payment plans for their patients who do not have insurance, but you will need to ask so you aren't surprised when you get to your appointment.  2) Contact Your Local Health Department Not all health departments have doctors that can see patients for sick visits, but many do, so it is worth a call to see if yours does. If you don't know where your local health department is, you can check in your phone book. The CDC also has a tool to help you locate your state's health department, and many state websites also have listings of all of their local health departments.  3) Find a [facility] If your illness is not likely to be very severe or complicated, you may want to try a walk in clinic. These are popping up all over the country in pharmacies, drugstores, and shopping centers. They're usually staffed by nurse practitioners or physician assistants that have been trained to treat common illnesses and complaints. They're usually fairly quick and inexpensive. However, if you have serious medical issues or chronic medical problems, these are probably not your best option.  No Primary Care Doctor: - Call Health Connect at  [phone] - they can help you locate a primary care doctor that  accepts your insurance, provides certain services,  etc. - Physician Referral Service- [phone]  Chronic Pain Problems: Organization         Address  Phone   Notes  [facility]  [phone] Patients need to be referred by their primary care doctor.   Medication Assistance: Organization         Address  Phone   Notes  [facility] Medication [facility] [address] [place], [place] [address] [phone] --Must be a resident of [facility] -- Must have NO insurance coverage whatsoever (no Medicaid/ Medicare, etc.) -- The pt. MUST have a primary care doctor that directs their care regularly and follows them in the community   MedAssist  [phone]   [employer]  [phone]    Agencies that provide inexpensive medical care: Organization         Address  Phone   Notes  [place]  [phone]   [facility]    [phone]   [facility] [place], [place] [address] [phone]   [place] [address] [place] [phone]   Planned Parenthood    [phone]   [facility]    [phone]   [place] and [place], [place] Phone:  [phone], Fax:  [phone] Hours of Operation:  9 am - 6 pm, M-F.  Also  accepts Medicaid/Medicare and self-pay.  [facility] for [place], [address], [place] Phone: [phone], Fax: [phone]. Hours of Operation:  8:30 am - 5:30 pm, M-F.  Also accepts Medicaid and self-pay.  [facility] [place] [address] [place] Phone: [phone]   [place], [place], [place] [phone], Ext. [phone] Mondays & Thursdays: 7-9 AM.  First 15 patients are seen on a first come, first serve basis.    [place] Providers:  Organization         Address  Phone   Notes  [facility] [address], [place] [phone] Also accepts self-pay patients.  [facility] [address], [place]  [phone]   [place], [address], [place] [phone]   [facility] [address] [place] [phone]   Lucianne Lei [address], [place]   [phone] Only accepts [place] Access [place] patients after they have their name applied to their card.   Self-Pay (no insurance) in [facility]:  Organization         Address  Phone   Notes  Sickle Cell Patients, [facility] [place] [phone]   [facility] [place] [phone]   [facility] [place]  [place], [place], [place] [phone]   [facility]/Dr. Osei-Bonsu  [address], [place] or [address], [place] [phone] Phone number for both [place] and [place] locations is the same.  Urgent Medical and [facility] [address], [place] [phone]   [facility] [address] [place] or [address] [phone]   [facility] [address], [place] [phone], phone; [phone], fax Sees patients 1st and 3rd Saturday of every month.  Must not qualify for public or private insurance (i.e. Medicaid, Medicare, [place] Health Choice, Veterans' Benefits)  Household income should be no more than 200% of the poverty level The clinic cannot treat you if you are pregnant or think you are pregnant  Sexually transmitted diseases are not treated at the clinic.    Dental Care: Organization         Address  Phone  Notes  [facility] Department of [facility] [place] [phone] Accepts children up to  age 73 who are enrolled in [place] or [place]; pregnant women with a Medicaid card; and children who have applied for Medicaid or [place] Health Choice, but were declined, whose parents can pay a reduced fee at time of service.  [facility] Department of [facility]  [address], [place] [phone] Accepts children up to age 74 who are enrolled in [place] or [place]; pregnant women with a Medicaid card; and children who have applied for Medicaid or  Health Choice, but were declined, whose parents can pay a reduced fee at time of service.  [place] Adult Dental Access PROGRAM  [place] [phone] Patients are seen by appointment only. Walk-ins are not accepted. [place] will see patients 74 years of age and older. Monday - Tuesday (8am-5pm)  Most Wednesdays (8:30-5pm) $30 per visit, cash only  [facility] Adult [employer] PROGRAM  [address], [facility] [phone] Patients are seen by appointment only. Walk-ins are not accepted. [place] will see patients 23 years of age and older. One Wednesday Evening (Monthly: Volunteer Based).  $30 per visit, cash only  [place]  [phone] for adults; Children under age 57, call Graduate Pediatric Dentistry at [phone]. Children aged 71-14, please call [phone] to request a pediatric application.  Dental services are provided in all areas of dental care including fillings, crowns and bridges, complete and partial dentures, implants, gum treatment, root canals, and extractions. Preventive care is also provided. Treatment is provided to both adults and children. Patients are selected via a lottery and there is often a waiting list.   [facility] [address] [place]  [phone] [email or web address]   [facility] [address] [place], [place] [phone], Ext. [phone] Second and Fourth Thursday of  each month, opens at 6:30 AM; Clinic ends at 9 AM.  Patients are seen on a first-come first-served basis, and a limited number are seen during each clinic.   [facility]  [address] Hillard Danker [place], [place] [phone]   Eligibility Requirements You must have lived in [place], [place], or [place] counties for at least the last three months.   You cannot be eligible for state or federal sponsored [employer], including [employer], [place], or [employer].   You generally cannot be eligible for healthcare insurance through your employer.    How to apply: Eligibility screenings are held every Tuesday and Wednesday afternoon from 1:00 pm until 4:00 pm. You do not need an appointment for the interview!  [facility] [address] [place], [place]   [place]  [place] Department  [place]  [phone]    Behavioral Health Resources in the Community: Intensive Outpatient Programs Organization         Address  Phone  Notes  [place]. [address], [place], [place] [phone]   [facility] [address], [place], [place]   ADS: Alcohol & Drug Svcs [address] [place], [place]   [place] [address],  [place], [place] or [phone]   Substance Abuse Resources Organization         Address  Phone  Notes  Alcohol and Drug Services  [phone]   [place]  [phone]   The [place]   Chinita Pester  [phone]   Residential & Outpatient Substance Abuse Program  [phone]   Psychological Services Organization         Address  Phone  Notes  [facility]  [place]  [phone]   [place] [address],  [place] or [phone]    Mobile Crisis Teams Organization         Address  Phone  Notes  [facility], Mobile Crisis Care Unit  [phone]   [facility]ive Psychotherapeutic Services  [address] [place], [place]   [place] [address] [place] [phone]    Self-Help/Support Groups Organization         Address  Phone  Notes  Mental Health Assoc. of [place] - variety of support groups  [place] Call for more information  Narcotics Anonymous (NA), [facility] [address], [employer] [place]  2 meetings at this location   Special educational needs teacher         Address  Phone  Notes  [facility] [place],    [place]  [phone]   [facility]  [address] [place] [phone], [place], [place]   [place], [place] [phone] Admissions: 8am-3pm M-F  Incentives Substance [place] [address],    [place], [place] [phone]   [facility] [address] [place], [place], [place]   [facility] [address],  [place], [place]   [facility] Programs - Intensive Outpatient [address], [place], [place]   [facility] [place] Community.,  [place], [place] [phone] or [phone]   [facility]idential Treatment Services (RTS) [address], [place], [place] Accepts Medicaid  [facility] [address],  [place] [phone] Substance Abuse/Addiction Treatment   [facility] Organization         Address  Phone  Notes  [facility]  [phone]   Domenic Schwab, PhD [address] Arlis [place], [place]   [phone] or [phone]   [place], [place] [phone]     [facility] [address], [place], [place] [phone] Insurance/Medicaid/sponsorship through [facility] and Families [address], [address] [place]                                    [place], [place] [phone] [place] [address], [place] [phone]    Dr. Adele Schilder  [phone]   [facility] Dept. 1) [address] [place] 2) [place] 3)  [place] [address], [place] [phone]  [phone]   [place] [phone] or [phone] (After Hours)

## 2015-12-31 NOTE — ED Notes (Signed)
Pt reports rectal bleeding and weakness that started approx 1 week ago Pt has hx of same.

## 2016-01-02 ENCOUNTER — Encounter: Payer: Self-pay | Admitting: Family Medicine

## 2016-01-02 ENCOUNTER — Ambulatory Visit (INDEPENDENT_AMBULATORY_CARE_PROVIDER_SITE_OTHER): Payer: Medicare Other | Admitting: Family Medicine

## 2016-01-02 VITALS — BP 162/100 | HR 72 | Temp 98.3°F | Resp 16 | Wt 163.0 lb

## 2016-01-02 DIAGNOSIS — F1910 Other psychoactive substance abuse, uncomplicated: Secondary | ICD-10-CM

## 2016-01-02 DIAGNOSIS — I10 Essential (primary) hypertension: Secondary | ICD-10-CM

## 2016-01-02 DIAGNOSIS — Z789 Other specified health status: Secondary | ICD-10-CM

## 2016-01-02 DIAGNOSIS — R0789 Other chest pain: Secondary | ICD-10-CM

## 2016-01-02 DIAGNOSIS — K625 Hemorrhage of anus and rectum: Secondary | ICD-10-CM

## 2016-01-02 DIAGNOSIS — F1090 Alcohol use, unspecified, uncomplicated: Secondary | ICD-10-CM

## 2016-01-02 LAB — CBC W/MCH & 3 PART DIFF
HCT: 48.9 % (ref 39.0–52.0)
HEMOGLOBIN: 14.4 g/dL (ref 13.0–17.0)
LYMPHS ABS: 1.8 10*3/uL (ref 0.7–4.0)
LYMPHS PCT: 29 % (ref 12–46)
MCH: 31.3 pg (ref 26.0–34.0)
MCHC: 29.4 g/dL — AB (ref 30.0–36.0)
MCV: 106.3 fL — ABNORMAL HIGH (ref 78.0–100.0)
NEUTROS PCT: 60 % (ref 43–77)
Neutro Abs: 3.7 10*3/uL (ref 1.7–7.7)
Platelets: 297 10*3/uL (ref 150–400)
RBC: 4.6 MIL/uL (ref 4.22–5.81)
RDW: 18.1 % — ABNORMAL HIGH (ref 11.5–15.5)
WBC mixed population %: 11 % (ref 3–18)
WBC mixed population: 0.7 10*3/uL (ref 0.1–1.8)
WBC: 6.2 10*3/uL (ref 4.0–10.5)

## 2016-01-02 MED ORDER — LORAZEPAM 0.5 MG PO TABS: ORAL_TABLET | ORAL | Status: DC

## 2016-01-02 MED ORDER — HYDROCORTISONE ACETATE 25 MG RE SUPP: 25.0000 mg | Freq: Two times a day (BID) | RECTAL | Status: DC

## 2016-01-02 NOTE — Patient Instructions (Signed)
For your drinking, take ativan twice a day for 1 week Then 1 a day for 1 week Then 1/2 tab for 1 week, then stop  Restart blood pressure medication Reschedule with stomach doctor Look into [place] meetings, or these other alcochol rehab programs F/U 6 weeks

## 2016-01-02 NOTE — Progress Notes (Signed)
Patient ID: David Curtis, male   DOB: [birth year], 55 y.o.   MRN: [id number]   Subjective:    Patient ID: David Curtis, male    DOB: 03/31/1961, 55 y.o.   MRN: [id number]  Patient presents for Blood in stool with dizziness and Unable to urinate  patient here to follow-up ER visit. He was seen for weakness and had difficulty urinating. All of his labs came back normal his urinalysis was normal his bladder scan did not show any urinary retention he also had some blood in the stool he has known internal hemorrhoids. After further discussion he's been drinking alcohol more heavily recently. He went to his probation officer he may be looking at prison time.   he has not been able sleep. He has been drinking more because of stress. He drank a fifth of liquor in a week and he also drinks beer on top of that. Some days he will go without drinking occasionally he gets the shakes but he just feels bad. He has been considering an alcohol treatment program. He denies using any other substances. He does have history of cocaine abuse as well. He had a drug screen done per report with his probation officer that was negative. Drug screen was not done at the emergency room. He states last night he has significant chest pain the finally went away and he cannot sleep.    of note he did miss follow-up with gastroenterology for his rectal bleeding.   He did travel recently and lost all of his prescriptions. He has not had his blood pressure medicine a couple weeks he does not have his protonix for his pain medication.   Review Of Systems: per above   GEN- + fatigue, fever, weight loss,weakness, recent illness HEENT- denies eye drainage, change in vision, nasal discharge, CVS- denies chest pain, palpitations RESP- denies SOB, cough, wheeze ABD- denies N/V, change in stools, abd pain GU- denies dysuria, hematuria, dribbling, incontinence MSK-+ joint pain, muscle aches, injury Neuro- denies headache, dizziness, syncope,  seizure activity       Objective:    BP 162/100 mmHg  Pulse 72  Temp(Src) 98.3 F (36.8 C) (Oral)  Resp 16  Wt 163 lb (73.936 kg) GEN- NAD, alert and oriented x3 HEENT- PERRL, EOMI, non injected sclera, pink conjunctiva, MMM, oropharynx clear Neck- Supple, no thyromegaly CVS- RRR, no murmur RESP-CTAB ABD-NABS,soft,NT,ND Psych- stressed, not anxious, SI, well groomed , does not smell of ETOH EXT- No edema Pulses- Radial  2+  EKG- NSR       Assessment & Plan:      Problem List Items Addressed This Visit    Substance abuse     Alcohol abuse is very unfortunate. I understand that he is very stressed as he may be looking at present time if they are unable to reduce his sentence. He is trying to get off alcoholbefore he goes back to see his Engineer, manufacturing systems.  I have given him some handouts on some local resources he can use. He can also take use alcoholic anonymous. He is getting some withdrawal symptoms and between the days that he tries to stop drinking. I did give him some low-dose Ativan enough for the next few weeks with a taper.      Relevant Orders   Drug Screen 10 W/Conf, Serum   Rectal bleeding - Primary    He has known internal  Hemorrhoids. I've given him suppositories to use he will also follow-up with gastroenterology and if he  needs some type of banding procedure done.      Relevant Orders   CBC w/M[facility] & 3 Part Diff (Completed)   Heavy alcohol use   Relevant Orders   Ethanol   Essential hypertension    Uncontrolled off medications , will restart        Other Visit Diagnoses    Other chest pain        EKG is normal, I think this is stressed related, and 2/2 gastritis with his ETOH use, restart protonix    Relevant Orders    EKG 12-Lead (Completed)       Note: This dictation was prepared with Dragon dictation along with smaller phrase technology. Any transcriptional errors that result from this process are unintentional.

## 2016-01-02 NOTE — Assessment & Plan Note (Signed)
Uncontrolled off medications , will restart

## 2016-01-02 NOTE — Assessment & Plan Note (Signed)
Alcohol abuse is very unfortunate. I understand that he is very stressed as he may be looking at present time if they are unable to reduce his sentence. He is trying to get off alcoholbefore he goes back to see his Engineer, manufacturing systems.  I have given him some handouts on some local resources he can use. He can also take use alcoholic anonymous. He is getting some withdrawal symptoms and between the days that he tries to stop drinking. I did give him some low-dose Ativan enough for the next few weeks with a taper.

## 2016-01-02 NOTE — Assessment & Plan Note (Signed)
He has known internal  Hemorrhoids. I've given him suppositories to use he will also follow-up with gastroenterology and if he needs some type of banding procedure done.

## 2016-01-04 ENCOUNTER — Other Ambulatory Visit: Payer: Self-pay | Admitting: Family Medicine

## 2016-01-04 ENCOUNTER — Telehealth: Payer: Self-pay | Admitting: Family Medicine

## 2016-01-04 DIAGNOSIS — F1910 Other psychoactive substance abuse, uncomplicated: Secondary | ICD-10-CM

## 2016-01-04 LAB — DRUG SCREEN 10 W/CONF, SERUM

## 2016-01-04 NOTE — Telephone Encounter (Addendum)
Ethanol and drug screen blood test ordered 2/8 not in correct tube.  Need red top whole blood tube.  Provider still wants tests done.  Pt called and is agreeable to come back to office Monday.  Lab called and stated new orders needed to be placed.  This has been done

## 2016-01-07 ENCOUNTER — Other Ambulatory Visit: Payer: Self-pay

## 2016-01-08 ENCOUNTER — Other Ambulatory Visit: Payer: Medicare Other

## 2016-01-08 DIAGNOSIS — F1910 Other psychoactive substance abuse, uncomplicated: Secondary | ICD-10-CM

## 2016-01-08 DIAGNOSIS — Z0283 Encounter for blood-alcohol and blood-drug test: Secondary | ICD-10-CM | POA: Diagnosis not present

## 2016-01-08 LAB — ETHANOL

## 2016-01-09 LAB — ETHANOL: ALCOHOL ETHYL (B): 11 mg/dL — AB (ref 0–10)

## 2016-01-14 LAB — DRUG SCREEN 10 W/CONF, SERUM

## 2016-01-16 ENCOUNTER — Encounter (HOSPITAL_COMMUNITY): Payer: Self-pay | Admitting: Cardiology

## 2016-01-16 ENCOUNTER — Emergency Department (HOSPITAL_COMMUNITY)
Admission: EM | Admit: 2016-01-16 | Discharge: 2016-01-16 | Disposition: A | Discharge status: Home / Self Care | Payer: Medicare Other | Attending: Emergency Medicine | Admitting: Emergency Medicine

## 2016-01-16 DIAGNOSIS — M1990 Unspecified osteoarthritis, unspecified site: Secondary | ICD-10-CM | POA: Insufficient documentation

## 2016-01-16 DIAGNOSIS — I10 Essential (primary) hypertension: Secondary | ICD-10-CM | POA: Diagnosis not present

## 2016-01-16 DIAGNOSIS — Z79899 Other long term (current) drug therapy: Secondary | ICD-10-CM | POA: Diagnosis not present

## 2016-01-16 DIAGNOSIS — K219 Gastro-esophageal reflux disease without esophagitis: Secondary | ICD-10-CM | POA: Diagnosis not present

## 2016-01-16 DIAGNOSIS — Z008 Encounter for other general examination: Secondary | ICD-10-CM | POA: Insufficient documentation

## 2016-01-16 DIAGNOSIS — K625 Hemorrhage of anus and rectum: Secondary | ICD-10-CM | POA: Diagnosis not present

## 2016-01-16 DIAGNOSIS — F1010 Alcohol abuse, uncomplicated: Secondary | ICD-10-CM | POA: Diagnosis not present

## 2016-01-16 DIAGNOSIS — F17210 Nicotine dependence, cigarettes, uncomplicated: Secondary | ICD-10-CM | POA: Diagnosis not present

## 2016-01-16 DIAGNOSIS — Z7982 Long term (current) use of aspirin: Secondary | ICD-10-CM | POA: Diagnosis not present

## 2016-01-16 LAB — ABO/RH: ABO/RH(D): O POS

## 2016-01-16 LAB — TYPE AND SCREEN
ABO/RH(D): O POS
ANTIBODY SCREEN: NEGATIVE

## 2016-01-16 LAB — ETHANOL: Alcohol, Ethyl (B): <5 mg/dL (ref <5)

## 2016-01-16 LAB — COMPREHENSIVE METABOLIC PANEL
ALBUMIN: 4 g/dL (ref 3.5–5.0)
ALK PHOS: 69 U/L (ref 38–126)
ALT: 18 U/L (ref 17–63)
ANION GAP: 9 (ref 5–15)
AST: 23 U/L (ref 15–41)
BILIRUBIN TOTAL: 0.4 mg/dL (ref 0.3–1.2)
BUN: 6 mg/dL (ref 6–20)
CHLORIDE: 104 mmol/L (ref 101–111)
CO2: 25 mmol/L (ref 22–32)
Calcium: 9.7 mg/dL (ref 8.9–10.3)
Creatinine, Ser: 0.87 mg/dL (ref 0.61–1.24)
GFR calc Af Amer: >60 mL/min (ref >60)
GFR calc non Af Amer: >60 mL/min (ref >60)
GLUCOSE: 122 mg/dL — AB (ref 65–99)
Potassium: 3.8 mmol/L (ref 3.5–5.1)
Sodium: 138 mmol/L (ref 135–145)
TOTAL PROTEIN: 7.2 g/dL (ref 6.5–8.1)

## 2016-01-16 LAB — POC OCCULT BLOOD, ED: FECAL OCCULT BLD: NEGATIVE

## 2016-01-16 LAB — CBC
HEMATOCRIT: 42.7 % (ref 39.0–52.0)
HEMOGLOBIN: 13.8 g/dL (ref 13.0–17.0)
MCH: 30.3 pg (ref 26.0–34.0)
MCHC: 32.3 g/dL (ref 30.0–36.0)
MCV: 93.8 fL (ref 78.0–100.0)
Platelets: 286 10*3/uL (ref 150–400)
RBC: 4.55 MIL/uL (ref 4.22–5.81)
RDW: 13.1 % (ref 11.5–15.5)
WBC: 6.3 10*3/uL (ref 4.0–10.5)

## 2016-01-16 MED ORDER — ONDANSETRON 4 MG PO TBDP
4.0000 mg | ORAL_TABLET | Freq: Once | ORAL | Status: AC
  Administered 2016-01-16: 4 mg via ORAL
  Filled 2016-01-16: qty 1

## 2016-01-16 MED ORDER — POLYETHYLENE GLYCOL 3350 17 GM/SCOOP PO POWD: 17.0000 g | Freq: Two times a day (BID) | ORAL | Status: DC

## 2016-01-16 NOTE — ED Notes (Signed)
Pt reports he is here for detox from ETOH and drugs. States his last drink was yesterday. Also reports rectal bleeding. Denies any SI/HI.

## 2016-01-16 NOTE — ED Provider Notes (Signed)
CSN: [id number]     Arrival date & time 01/16/16  [id number] History   First MD Initiated Contact with Patient 01/16/16 1458     Chief Complaint  Patient presents with  . Medical Clearance  . Rectal Bleeding    Vernell Gates is a 55 y.o. male with a history of alcohol abuse who presents to the ED requesting help with detox from alcohol and reported he's been having rectal bleeding. Patient reports bright red blood with bowel movements intermittently for the past 4 months. He reports this has come back again about 2 weeks ago. He reports having brown bowel movements but having bright red blood in the toilet bowl and after wiping. He denies straining with bowel movements but does report hard stools. He reports his last bowel movement was last night. He reports having a colonoscopy 4 months ago that was unremarkable. Patient also complains of generalized lower abdominal pain currently that he rates at a 6 out of 10. He denies any nausea or vomiting. He reports he has been drinking alcohol intermittently for a long period of time. He reports his last drink was yesterday. He denies history of seizures from withdrawing from alcohol. He denies fevers, suicide ideations, homicidal ideations, vomiting, diarrhea, urinary symptoms, seizures, syncope, or rashes.  Patient is a 55 y.o. male presenting with hematochezia. The history is provided by the patient. No language interpreter was used.  Rectal Bleeding Associated symptoms: abdominal pain   Associated symptoms: no fever, no light-headedness and no vomiting     Past Medical History  Diagnosis Date  . Hypertension   . GERD (gastroesophageal reflux disease)   . Arthritis    Past Surgical History  Procedure Laterality Date  . Appendectomy  2011  . Colonoscopy      Approx 2000 in [place], [place]; infectious colitis, no polyps/masses (per patient; records not available)  . Joint replacement Right     ankle- has been broken 4x  . Colonoscopy with propofol N/A  08/09/2015    [id number] hemorrhoids  . Polypectomy N/A 08/09/2015    Procedure: POLYPECTOMY;  Surgeon: Daneil Dolin, MD;  Location: [facility];  Service: Endoscopy;  Laterality: N/A;   Family History  Problem Relation Age of Onset  . Arthritis Mother   . Cancer Mother     unknown type  . Diabetes Mother   . Hypertension Mother   . Arthritis Sister   . Cancer Sister     unknown type, possibly breast CA  . COPD Brother   . Arthritis Sister   . Arthritis Sister   . Arthritis Sister   . Colon cancer Neg Hx   . Pancreatic cancer Neg Hx    Social History  Substance Use Topics  . Smoking status: Current Every Day Smoker -- 3.00 packs/day for 40 years    Types: Cigarettes  . Smokeless tobacco: Former Systems developer  . Alcohol Use: 7.2 oz/week    12 Cans of beer, 0 Standard drinks or equivalent per week     Comment: 1/2 pint and six pack a day.    Review of Systems  Constitutional: Negative for fever and chills.  HENT: Negative for congestion and sore throat.   Eyes: Negative for visual disturbance.  Respiratory: Positive for cough. Negative for wheezing.   Cardiovascular: Negative for chest pain and palpitations.  Gastrointestinal: Positive for nausea, abdominal pain, blood in stool and hematochezia. Negative for vomiting, diarrhea and abdominal distention.  Genitourinary: Negative for dysuria, urgency, frequency, hematuria and difficulty urinating.  Musculoskeletal: Negative for back pain and neck pain.  Skin: Negative for rash.  Neurological: Negative for seizures, syncope, light-headedness and headaches.      Allergies  Review of patient's allergies indicates no known allergies.  Home Medications   Prior to Admission medications   Medication Sig Start Date End Date Taking? Authorizing Provider  acetaminophen (TYLENOL) 500 MG tablet Take 500 mg by mouth every 6 (six) hours as needed for mild pain or moderate pain.   Yes Historical Provider, MD  aspirin EC 81 MG tablet Take 81 mg  by mouth daily.   Yes Historical Provider, MD  diazepam (VALIUM) 5 MG tablet Take 1 tablet by mouth 2 (two) times daily. 01/04/16  Yes Historical Provider, MD  HYDROcodone-acetaminophen (NORCO/VICODIN) 5-325 MG tablet Take 1 tablet by mouth 2 (two) times daily as needed. Patient taking differently: Take 1 tablet by mouth 2 (two) times daily as needed for moderate pain or severe pain.  12/31/15  Yes Alycia Rossetti, MD  hydrocortisone (ANUSOL-HC) 25 MG suppository Place 1 suppository (25 mg total) rectally 2 (two) times daily. 01/02/16  Yes Alycia Rossetti, MD  lisinopril (PRINIVIL,ZESTRIL) 40 MG tablet Take 1 tablet (40 mg total) by mouth daily. 12/31/15  Yes Alycia Rossetti, MD  LORazepam (ATIVAN) 0.5 MG tablet Take 1 tab BID x 2 week, then 1 tab qhs 01/02/16  Yes Alycia Rossetti, MD  pantoprazole (PROTONIX) 40 MG tablet Take 40 mg by mouth daily.   Yes Historical Provider, MD  polyethylene glycol powder (GLYCOLAX/MIRALAX) powder Take 17 g by mouth 2 (two) times daily. Until daily soft stools  OTC 01/16/16   Waynetta Pean, PA-C   BP 144/77 mmHg  Pulse 64  Temp(Src) 97.5 F (36.4 C) (Oral)  Resp 16  SpO2 96% Physical Exam  Constitutional: He appears well-developed and well-nourished. No distress.  Nontoxic appearing.  HENT:  Head: Normocephalic and atraumatic.  Right Ear: External ear normal.  Left Ear: External ear normal.  Mouth/Throat: Oropharynx is clear and moist.  Eyes: Conjunctivae are normal. Pupils are equal, round, and reactive to light. Right eye exhibits no discharge. Left eye exhibits no discharge.  Neck: Normal range of motion. Neck supple. No JVD present.  Cardiovascular: Normal rate, regular rhythm, normal heart sounds and intact distal pulses.  Exam reveals no gallop and no friction rub.   No murmur heard. Pulmonary/Chest: Effort normal and breath sounds normal. No respiratory distress. He has no wheezes. He has no rales.  Lungs are clear to auscultation bilaterally. No  wheezes or rhonchi noted.  Abdominal: Soft. Bowel sounds are normal. He exhibits no distension. There is no tenderness. There is no guarding.  Abdomen is soft and nontender to palpation. Bowel sounds are present. No distention.  Genitourinary: Rectum normal. Guaiac negative stool.  Digital rectal exam performed by me with male RN chaperone. No gross bloody stool. No external hemorrhoids noted. Anal sphincter tone is normal.  Musculoskeletal: He exhibits no edema.  Lymphadenopathy:    He has no cervical adenopathy.  Neurological: He is alert. Coordination normal.  No tremor noted.  Skin: Skin is warm and dry. No rash noted. He is not diaphoretic. No erythema. No pallor.  Psychiatric: He has a normal mood and affect. His speech is normal and behavior is normal. His mood appears not anxious. He is not actively hallucinating. He does not exhibit a depressed mood. He expresses no homicidal and no suicidal ideation.  He denies homicidal or suicidal ideations. Patient has good  eye contact.  Nursing note and vitals reviewed.   ED Course  Procedures (including critical care time) Labs Review Labs Reviewed  COMPREHENSIVE METABOLIC PANEL - Abnormal; Notable for the following:    Glucose, Bld 122 (*)    All other components within normal limits  ETHANOL  CBC  POC OCCULT BLOOD, ED  TYPE AND SCREEN  ABO/RH    Imaging Review No results found. I have personally reviewed and evaluated these lab results as part of my medical decision-making.   EKG Interpretation None      Filed Vitals:   01/16/16 1508 01/16/16 1515 01/16/16 1629 01/16/16 1631  BP: 139/81 125/84 121/74 144/77  Pulse: 84 78 60 64  Temp:      TempSrc:      Resp:  16 18 16   SpO2:  95% 97% 96%     MDM   Meds given in ED:  Medications  ondansetron (ZOFRAN-ODT) disintegrating tablet 4 mg (4 mg Oral Given 01/16/16 1528)    New Prescriptions   POLYETHYLENE GLYCOL POWDER (GLYCOLAX/MIRALAX) POWDER    Take 17 g by mouth 2  (two) times daily. Until daily soft stools  OTC    Final diagnoses:  Rectal bleeding  Alcohol abuse   This  is a 55 y.o. male with a history of alcohol abuse who presents to the ED requesting help with detox from alcohol and reported he's been having rectal bleeding. Patient reports bright red blood with bowel movements intermittently for the past 4 months. He reports this has come back again about 2 weeks ago. He reports having brown bowel movements but having bright red blood in the toilet bowl and after wiping. He denies straining with bowel movements but does report hard stools. He reports his last bowel movement was last night. He reports having a colonoscopy 4 months ago that was unremarkable. Patient also complains of generalized lower abdominal pain currently that he rates at a 6 out of 10. He denies any nausea or vomiting. He reports he has been drinking alcohol intermittently for a long period of time. He reports his last drink was yesterday. He denies SI or HI.  On exam the patient is afebrile nontoxic appearing. His abdomen is soft and nontender to palpation. On digital rectal exam the patient has no gross bloody stool. Stool is brown and he is Hemoccult negative. CMP is unremarkable. His alcohol level is 0. CBC shows normal hemoglobin and hematocrit. Patient's BUN is not elevated. With the patient having negative occult blood and normal BUN high highly doubt GI bleeding. I question if the patient is having internal hemorrhoids. He had no external hemorrhoids on exam. He does report having hard stools. Will start on MiraLAX. Will provide the patient with resources for alcohol detox. He does not meet inpatient criteria at this time. I advised the patient to follow-up with their primary care provider this week. I advised the patient to return to the emergency department with new or worsening symptoms or new concerns. The patient verbalized understanding and agreement with plan.       Waynetta Pean, PA-C 01/16/16 1638  Gareth Morgan, MD 01/17/16 1246

## 2016-01-16 NOTE — Discharge Instructions (Signed)
Gastrointestinal Bleeding Gastrointestinal bleeding is bleeding somewhere along the path that food travels through the body (digestive tract). This path is anywhere between the mouth and the opening of the butt (anus). You may have blood in your throw up (vomit) or in your poop (stools). If there is a lot of bleeding, you may need to stay in the hospital. [place]  Only take medicine as told by your doctor.  Eat foods with fiber such as whole grains, fruits, and vegetables. You can also try eating 1 to 3 prunes a day.  Drink enough fluids to keep your pee (urine) clear or pale yellow. GET HELP RIGHT AWAY IF:   Your bleeding gets worse.  You feel dizzy, weak, or you pass out (faint).  You have bad cramps in your back or belly (abdomen).  You have large blood clumps (clots) in your poop.  Your problems are getting worse. MAKE SURE YOU:   Understand these instructions.  Will watch your condition.  Will get help right away if you are not doing well or get worse.   This information is not intended to replace advice given to you by your health care provider. Make sure you discuss any questions you have with your health care provider.   Document Released: 08/19/2008 Document Revised: 10/27/2012 Document Reviewed: 04/30/2015 Elsevier Interactive Patient Education 2016 [employer].  Alcohol Abuse and Nutrition Alcohol abuse is any pattern of alcohol consumption that harms your health, relationships, or work. Alcohol abuse can affect how your body breaks down and absorbs nutrients from food by causing your liver to work abnormally. Additionally, many people who abuse alcohol do not eat enough carbohydrates, protein, fat, vitamins, and minerals. This can cause poor nutrition (malnutrition) and a lack of nutrients (nutrient deficiencies), which can lead to further complications. Nutrients that are commonly lacking (deficient) among people who abuse alcohol include:  Vitamins.  Vitamin  A. This is stored in your liver. It is important for your vision, metabolism, and ability to fight off infections (immunity).  B vitamins. These include vitamins such as folate, thiamin, and niacin. These are important in new cell growth and maintenance.  Vitamin C. This plays an important role in iron absorption, wound healing, and immunity.  Vitamin D. This is produced by your liver, but you can also get vitamin D from food. Vitamin D is necessary for your body to absorb and use calcium.  Minerals.  Calcium. This is important for your bones and your heart and blood vessel (cardiovascular) function.  Iron. This is important for blood, muscle, and nervous system functioning.  Magnesium. This plays an important role in muscle and nerve function, and it helps to control blood sugar and blood pressure.  Zinc. This is important for the normal function of your nervous system and digestive system (gastrointestinal tract). Nutrition is an essential component of therapy for alcohol abuse. Your health care provider or dietitian will work with you to design a plan that can help restore nutrients to your body and prevent potential complications. WHAT IS MY PLAN? Your dietitian may develop a specific diet plan that is based on your condition and any other complications you may have. A diet plan will commonly include:  A balanced diet.  Grains: 6-8 oz per day.  Vegetables: 2-3 cups per day.  Fruits: 1-2 cups per day.  Meat and other protein: 5-6 oz per day.  Dairy: 2-3 cups per day.  Vitamin and mineral supplements. WHAT DO I NEED TO KNOW ABOUT ALCOHOL  AND NUTRITION?  Consume foods that are high in antioxidants, such as grapes, berries, nuts, green tea, and dark green and orange vegetables. This can help to counteract some of the stress that is placed on your liver by consuming alcohol.  Avoid food and drinks that are high in fat and sugar. Foods such as sugared soft drinks, salty snack  foods, and candy contain empty calories. This means that they lack important nutrients such as protein, fiber, and vitamins.  Eat frequent meals and snacks. Try to eat 5-6 small meals each day.  Eat a variety of fresh fruits and vegetables each day. This will help you get plenty of water, fiber, and vitamins in your diet.  Drink plenty of water and other clear fluids. Try to drink at least 48-64 oz (1.5-2 L) of water per day.  If you are a vegetarian, eat a variety of protein-rich foods. Pair whole grains with plant-based proteins at meals and snacks to obtain the greatest nutrient benefit from your food. For example, eat rice with beans, put peanut butter on whole-grain toast, or eat oatmeal with sunflower seeds.  Soak beans and whole grains overnight before cooking. This can help your body to absorb the nutrients more easily.  Include foods fortified with vitamins and minerals in your diet. Commonly fortified foods include milk, orange juice, cereal, and bread.  If you are malnourished, your dietitian may recommend a high-protein, high-calorie diet. This may include:  2,000-3,000 calories (kilocalories) per day.  70-100 grams of protein per day.  Your health care provider may recommend a complete nutritional supplement beverage. This can help to restore calories, protein, and vitamins to your body. Depending on your condition, you may be advised to consume this instead of or in addition to meals.  Limit your intake of caffeine. Replace drinks like coffee and black tea with decaffeinated coffee and herbal tea.  Eat a variety of foods that are high in omega fatty acids. These include fish, nuts and seeds, and soybeans. These foods may help your liver to recover and may also stabilize your mood.  Certain medicines may cause changes in your appetite, taste, and weight. Work with your health care provider and dietitian to make any adjustments to your medicines and diet plan.  Include other  healthy lifestyle choices in your daily routine.  Be physically active.  Get enough sleep.  Spend time doing activities that you enjoy.  If you are unable to take in enough food and calories by mouth, your health care provider may recommend a feeding tube. This is a tube that passes through your nose and throat, directly into your stomach. Nutritional supplement beverages can be given to you through the feeding tube to help you get the nutrients you need.  Take vitamin or mineral supplements as recommended by your health care provider. WHAT FOODS CAN I EAT? Grains Enriched pasta. Enriched rice. Fortified whole-grain bread. Fortified whole-grain cereal. Barley. Brown rice. Quinoa. [place]. Vegetables All fresh, frozen, and canned vegetables. Spinach. Kale. Artichoke. Carrots. Winter squash and pumpkin. Sweet potatoes. Broccoli. Cabbage. Cucumbers. Tomatoes. Sweet peppers. Green beans. Peas. Corn. Fruits All fresh and frozen fruits. Berries. Grapes. Mango. Papaya. Guava. Cherries. Apples. Bananas. Peaches. Plums. Pineapple. Watermelon. Cantaloupe. Oranges. Avocado. Meats and Other Protein Sources Beef liver. Lean beef. Pork. Fresh and canned chicken. Fresh fish. Oysters. Sardines. Canned tuna. Shrimp. Eggs with yolks. Nuts and seeds. Peanut butter. Beans and lentils. Soybeans. Tofu. Dairy Whole, low-fat, and nonfat milk. Whole, low-fat, and nonfat yogurt. [employer]  cheese. Sour cream. Hard and soft cheeses. Beverages Water. Herbal tea. Decaffeinated coffee. Decaffeinated green tea. 100% fruit juice. 100% vegetable juice. Instant breakfast shakes. Condiments Ketchup. Mayonnaise. Mustard. Salad dressing. Barbecue sauce. Sweets and Desserts Sugar-free ice cream. Sugar-free pudding. Sugar-free gelatin. Fats and Oils Butter. Vegetable oil, flaxseed oil, olive oil, and walnut oil. Other Complete nutrition shakes. Protein bars. Sugar-free gum. The items listed above may not be a complete list of  recommended foods or beverages. Contact your dietitian for more options. WHAT FOODS ARE NOT RECOMMENDED? Grains Sugar-sweetened breakfast cereals. Flavored instant oatmeal. Fried breads. Vegetables Breaded or deep-fried vegetables. Fruits Dried fruit with added sugar. Candied fruit. Canned fruit in syrup. Meats and Other Protein Sources Breaded or deep-fried meats. Dairy Flavored milks. Fried cheese curds or fried cheese sticks. Beverages Alcohol. Sugar-sweetened soft drinks. Sugar-sweetened tea. Caffeinated coffee and tea. Condiments Sugar. Honey. Agave nectar. Molasses. Sweets and Desserts Chocolate. Cake. Cookies. Candy. Other Potato chips. Pretzels. Salted nuts. Candied nuts. The items listed above may not be a complete list of foods and beverages to avoid. Contact your dietitian for more information.   This information is not intended to replace advice given to you by your health care provider. Make sure you discuss any questions you have with your health care provider.   Document Released: 09/04/2005 Document Revised: 12/01/2014 Document Reviewed: 06/13/2014 Elsevier Interactive Patient Education 2016 [employer].  Emergency Department Resource Guide 1) Find a Doctor and Pay Out of Pocket Although you won't have to find out who is covered by your insurance plan, it is a good idea to ask around and get recommendations. You will then need to call the office and see if the doctor you have chosen will accept you as a new patient and what types of options they offer for patients who are self-pay. Some doctors offer discounts or will set up payment plans for their patients who do not have insurance, but you will need to ask so you aren't surprised when you get to your appointment.  2) Contact Your Local Health Department Not all health departments have doctors that can see patients for sick visits, but many do, so it is worth a call to see if yours does. If you don't know where your  local health department is, you can check in your phone book. The CDC also has a tool to help you locate your state's health department, and many state websites also have listings of all of their local health departments.  3) Find a [facility] If your illness is not likely to be very severe or complicated, you may want to try a walk in clinic. These are popping up all over the country in pharmacies, drugstores, and shopping centers. They're usually staffed by nurse practitioners or physician assistants that have been trained to treat common illnesses and complaints. They're usually fairly quick and inexpensive. However, if you have serious medical issues or chronic medical problems, these are probably not your best option.  No Primary Care Doctor: - Call Health Connect at  [phone] - they can help you locate a primary care doctor that  accepts your insurance, provides certain services, etc. - Physician Referral Service- [phone]  Chronic Pain Problems: Organization         Address  Phone   Notes  [facility]  [phone] Patients need to be referred by their primary care doctor.   Medication Assistance: Organization         Address  Phone   Notes  [facility] Medication [facility] [address] [place], [place] [address] [phone] --Must be a resident of [facility] -- Must have NO insurance coverage whatsoever (no Medicaid/ Medicare, etc.) -- The pt. MUST have a primary care doctor that directs their care regularly and follows them in the community   MedAssist  [phone]   [employer]  [phone]    Agencies that provide inexpensive medical care: Organization         Address  Phone   Notes  [place]  [phone]   [facility]    [phone]   [facility] [place], [place] [address] [phone]   [place]  [address], [place] [phone]   Planned Parenthood    [phone]   [facility]    [phone]   [place] and [address], [place] Phone:  [phone], Fax:  [phone] Hours of Operation:  9 am - 6 pm, M-F.  Also accepts Medicaid/Medicare and self-pay.  [facility] for [place]ity [address], [address], [place] Phone: [phone], Fax: [phone]. Hours of Operation:  8:30 am - 5:30 pm, M-F.  Also accepts Medicaid and self-pay.  [facility] [place] [address] [place] Phone: [phone]   [place], [place], [place] [phone], Ext. [phone] Mondays & Thursdays: 7-9 AM.  First 15 patients are seen on a first come, first serve basis.    [place] Providers:  Organization         Address  Phone   Notes  [facility] [address], [place] [phone] Also accepts self-pay patients.  [facility] [id number] [place], [place]  [phone]   [place], [address], [place] [phone]   [facility] [address], [place] [phone]   Lucianne Lei [address], [place]   [phone] Only accepts [place] Access [place] patients after they have their name applied to their card.   Self-Pay (no insurance) in [facility]:  Organization         Address  Phone   Notes  Sickle Cell Patients, [facility] [place] [phone]   [facility] [place] [phone]   [facility] [place]  [place], [place], [place] [phone]   [facility]/Dr. Osei-Bonsu  [address], [place] or [address], [place] [phone] Phone number for both [place] and [place] locations is the same.  [facility] [address], [place] [phone]   [facility] [address] [place] or [address] [phone]   [facility] [address] [place] [phone], phone; [phone], fax Sees patients 1st and 3rd Saturday of every month.  Must not qualify for public or private insurance (i.e. Medicaid, Medicare, [place] Health Choice, Veterans' Benefits)  Household income should be no more than 200% of the poverty level The clinic cannot treat you if you are pregnant or think you are pregnant  Sexually transmitted diseases are not treated at the clinic.    Dental Care: Organization         Address  Phone  Notes  [facility] of [facility] [place] [phone] Accepts children up to age 68 who are enrolled in [place] or [place]; pregnant women with a Medicaid card; and children who have applied for Medicaid or [place] Health Choice, but were declined, whose parents can pay a reduced fee at time of service.  [facility]  [address], [place] [phone] Accepts children up to age 80 who are enrolled in [place] or [place]; pregnant women with a Medicaid card; and children who have applied for Medicaid or [place] Health Choice, but were declined, whose parents can pay a reduced fee at time of service.  [place] Adult Dental Access PROGRAM  [place] [phone] Patients are seen by appointment only. Walk-ins are not accepted. [place] will see patients 52 years of age and older. Monday - Tuesday (8am-5pm) Most Wednesdays (8:30-5pm) $30 per visit, cash only  [facility] Adult Dental Access PROGRAM  [address], [facility] [phone] Patients are seen by appointment only. Walk-ins are not  accepted. [place] will see patients 41 years of age and older. One Wednesday Evening (Monthly: Volunteer Based).  $30 per visit, cash only  [place]  [phone] for adults; Children under age 42, call Graduate Pediatric Dentistry at [phone]. Children aged 21-14, please call [phone] to request a pediatric application.  Dental services are provided in all areas of dental care including fillings, crowns and bridges, complete and partial dentures, implants, gum treatment, root canals, and extractions. Preventive care is also provided. Treatment is provided to both adults and children. Patients are selected via a lottery and there is often a waiting list.   [facility] [address], [place]  [phone] [email or web address]   [facility] [address] [place], [place] [phone], Ext. [phone] Second and Fourth Thursday of each month, opens at 6:30 AM; Clinic ends at 9 AM.  Patients are seen on a first-come first-served basis, and a limited number are seen during each clinic.   [facility]  [address] Hillard Danker [place], [place] [phone]   Eligibility Requirements You must have lived in [place], [place], or [place] counties for at least the last three months.   You cannot be eligible for state or federal sponsored [employer], including [employer], [place], or [employer].   You generally cannot be eligible for healthcare insurance through your employer.    How to apply: Eligibility screenings are held every Tuesday and Wednesday afternoon from 1:00 pm until 4:00 pm. You do not need an appointment for the interview!  [facility] [address] [place], [place]   [place]  [place] Department  [place]  [phone]    Behavioral Health Resources in the  Community: Intensive Outpatient Programs Organization         Address  Phone  Notes  [place]. [address] [place], [place] [phone]   [facility] Outpatient [address] [place], [place]   ADS: Alcohol & Drug [facility] [address], [place], [place]  [place] [address]  [place], [place] or [phone]   Substance Abuse Resources Organization         Address  Phone  Notes  Alcohol and Drug Services  9108250913   [place]  [phone]   [facility] [place]   Chinita Pester  [phone]   Residential & Outpatient Substance Abuse Program  5070262313   Psychological Services Organization         Address  Phone  Notes  [facility] [place]  Advance  [phone]   [place] [address], [place] or [phone]    Mobile Crisis Teams Organization         Address  Phone  Notes  [facility], Mobile Crisis Care Unit  [phone]   [facility]  [address] [place], [place]   [place] Levels [address] [place] [phone]    Self-Help/Support Groups Organization         Address  Phone             Notes  [place]. of [place] - variety of support groups  [place] Call for more information  Narcotics Anonymous (NA), [facility] [address], [employer] [place]  2 meetings at this location   Special educational needs teacher         Address  Phone  Notes  [facility] [place],    [place]  [phone]   [facility]  [address], [place] [phone], [place], [place]   Robert Lee [place], [place] [phone] Admissions: 8am-3pm M-F  Incentives Substance [address],    [place], [place] [phone]   [facility] [address] [place], [place], [place] and [place]   [facility] [address]  [place], [place]   [facility] - Intensive Outpatient [address] Kristeen [address], [place], [place]   [facility] ([place].) [place].,  [place], [place] [phone] or [phone]   [facility] (RTS) [address] [place], [place] Accepts Medicaid  [facility] [place] [address],  [place] [phone] Substance Abuse/Addiction Treatment   [facility] Organization         Address  Phone  Notes  [facility]  [phone]   Domenic Schwab, PhD [address] Arlis Porta [place], [place]   [phone] or [phone]   [place], [place] [phone]   [facility] [address] [place], [place] [phone] Insurance/Medicaid/sponsorship through [facility] Families [address] [place]                                    [place], [place] [phone] [place] [address], [place] [phone]    Dr. Adele Schilder  [phone]   [facility] Dept. 1) [address], [place] 2) [place] 3)  [address], [place] [phone]  [phone]   [place] [phone] or [phone] (  After Hours)

## 2016-01-17 DIAGNOSIS — F1010 Alcohol abuse, uncomplicated: Secondary | ICD-10-CM | POA: Diagnosis not present

## 2016-01-17 DIAGNOSIS — R109 Unspecified abdominal pain: Secondary | ICD-10-CM | POA: Diagnosis not present

## 2016-01-30 ENCOUNTER — Telehealth: Payer: Self-pay | Admitting: Family Medicine

## 2016-01-30 NOTE — Telephone Encounter (Signed)
Okay to refill? 

## 2016-01-30 NOTE — Telephone Encounter (Signed)
Patient is calling to get rx for hydrocodone  [phone]

## 2016-01-30 NOTE — Telephone Encounter (Signed)
?   Ok to refill   Last rf:01/03/16

## 2016-01-31 MED ORDER — HYDROCODONE-ACETAMINOPHEN 5-325 MG PO TABS: 1.0000 | ORAL_TABLET | Freq: Two times a day (BID) | ORAL | Status: DC | PRN

## 2016-01-31 NOTE — Telephone Encounter (Signed)
Script printed,ready for provider signature 

## 2016-02-01 ENCOUNTER — Other Ambulatory Visit: Payer: Self-pay | Admitting: *Deleted

## 2016-02-01 MED ORDER — HYDROCODONE-ACETAMINOPHEN 5-325 MG PO TABS: 1.0000 | ORAL_TABLET | Freq: Two times a day (BID) | ORAL | Status: DC | PRN

## 2016-03-03 ENCOUNTER — Telehealth: Payer: Self-pay | Admitting: Family Medicine

## 2016-03-03 NOTE — Telephone Encounter (Signed)
Okay to refill? 

## 2016-03-03 NOTE — Telephone Encounter (Signed)
Pt is calling for Hydrocodone [phone]. [phone]

## 2016-03-03 NOTE — Telephone Encounter (Signed)
Ok to refill??  Last office visit 01/02/2016.  Last refill 02/01/2016.

## 2016-03-04 MED ORDER — HYDROCODONE-ACETAMINOPHEN 5-325 MG PO TABS: 1.0000 | ORAL_TABLET | Freq: Two times a day (BID) | ORAL | Status: DC | PRN

## 2016-03-04 NOTE — Telephone Encounter (Signed)
Prescription printed and patient made aware to come to office to pick up after 2pm on 03/04/2016.

## 2016-03-07 ENCOUNTER — Ambulatory Visit: Payer: Medicare Other | Admitting: Family Medicine

## 2016-03-11 ENCOUNTER — Encounter: Payer: Self-pay | Admitting: Family Medicine

## 2016-04-02 ENCOUNTER — Telehealth: Payer: Self-pay | Admitting: Family Medicine

## 2016-04-02 MED ORDER — HYDROCODONE-ACETAMINOPHEN 5-325 MG PO TABS: 1.0000 | ORAL_TABLET | Freq: Two times a day (BID) | ORAL | Status: DC | PRN

## 2016-04-02 NOTE — Telephone Encounter (Signed)
Okay to refill? 

## 2016-04-02 NOTE — Telephone Encounter (Signed)
Needs rx for hydrocodone  [phone]

## 2016-04-02 NOTE — Telephone Encounter (Signed)
RX printed, left up front and patient aware to pick up after 2 pm  

## 2016-04-02 NOTE — Telephone Encounter (Signed)
Ok to refill??        Last office visit 01/02/2016.  Last refill 03/03/16.

## 2016-05-02 ENCOUNTER — Telehealth: Payer: Self-pay | Admitting: Family Medicine

## 2016-05-02 MED ORDER — HYDROCODONE-ACETAMINOPHEN 5-325 MG PO TABS
1.0000 | ORAL_TABLET | Freq: Two times a day (BID) | ORAL | Status: DC | PRN
Start: 2016-05-02 — End: 2016-07-14

## 2016-05-02 NOTE — Telephone Encounter (Signed)
Ok to refill??  Last office visit 01/02/2016.  Last refill 04/02/2016.

## 2016-05-02 NOTE — Telephone Encounter (Signed)
Okay to refill? 

## 2016-05-02 NOTE — Telephone Encounter (Signed)
Calling to get rx for hydrocodone  [phone]

## 2016-05-02 NOTE — Telephone Encounter (Signed)
Prescription printed and patient made aware to come to office to pick up after 2pm on 05/02/2016.

## 2016-05-05 ENCOUNTER — Ambulatory Visit: Payer: Medicare Other | Admitting: Family Medicine

## 2016-05-06 ENCOUNTER — Ambulatory Visit (HOSPITAL_COMMUNITY)
Admission: RE | Admit: 2016-05-06 | Discharge: 2016-05-06 | Disposition: A | Discharge status: Home / Self Care | Payer: Medicare Other | Source: Ambulatory Visit | Attending: Family Medicine | Admitting: Family Medicine

## 2016-05-06 ENCOUNTER — Encounter: Payer: Self-pay | Admitting: Family Medicine

## 2016-05-06 ENCOUNTER — Ambulatory Visit (INDEPENDENT_AMBULATORY_CARE_PROVIDER_SITE_OTHER): Payer: Medicare Other | Admitting: Family Medicine

## 2016-05-06 VITALS — BP 140/84 | HR 88 | Temp 98.0°F | Resp 16 | Ht 68.0 in | Wt 163.0 lb

## 2016-05-06 DIAGNOSIS — X58XXXA Exposure to other specified factors, initial encounter: Secondary | ICD-10-CM | POA: Diagnosis not present

## 2016-05-06 DIAGNOSIS — M67479 Ganglion, unspecified ankle and foot: Secondary | ICD-10-CM | POA: Diagnosis not present

## 2016-05-06 DIAGNOSIS — S99922A Unspecified injury of left foot, initial encounter: Secondary | ICD-10-CM | POA: Diagnosis not present

## 2016-05-06 DIAGNOSIS — G8929 Other chronic pain: Secondary | ICD-10-CM

## 2016-05-06 DIAGNOSIS — M25571 Pain in right ankle and joints of right foot: Secondary | ICD-10-CM

## 2016-05-06 DIAGNOSIS — S99912A Unspecified injury of left ankle, initial encounter: Secondary | ICD-10-CM | POA: Insufficient documentation

## 2016-05-06 MED ORDER — OXYCODONE-ACETAMINOPHEN 5-325 MG PO TABS: 1.0000 | ORAL_TABLET | Freq: Three times a day (TID) | ORAL | Status: DC | PRN

## 2016-05-06 MED ORDER — MELOXICAM 7.5 MG PO TABS: 7.5000 mg | ORAL_TABLET | Freq: Every day | ORAL | Status: DC

## 2016-05-06 NOTE — Progress Notes (Signed)
Patient ID: David Curtis, male   DOB: [birth year], 55 y.o.   MRN: [id number]   Subjective:    Patient ID: David Curtis, male    DOB: 08-Jul-1961, 55 y.o.   MRN: [id number]  Patient presents for Knot to L Outer Ankle Patient here with left ankle pain for the past week. He was mowing the lawn he things he may have stepped off and twisted around weight. He has significant swelling and now has a small knot above his ankle that is also very tender. States that his pain medications are not helping. He has been icing and trying to elevate. He has pain with trying to ambulate. He also continues to have chronic pain in his right ankle he has had multiple surgeries on his ankle as well as injections. This ankle injury came from an injury on the job back in 1998. He's been on chronic pain medications since then.    Review Of Systems:  GEN- denies fatigue, fever, weight loss,weakness, recent illness HEENT- denies eye drainage, change in vision, nasal discharge, CVS- denies chest pain, palpitations RESP- denies SOB, cough, wheeze ABD- denies N/V, change in stools, abd pain GU- denies dysuria, hematuria, dribbling, incontinence MSK-+ joint pain, muscle aches, injury Neuro- denies headache, dizziness, syncope, seizure activity       Objective:    BP 140/84 mmHg  Pulse 88  Temp(Src) 98 F (36.7 C) (Oral)  Resp 16  Ht 5\' 8"  (1.727 m)  Wt 163 lb (73.936 kg)  BMI 24.79 kg/m2 GEN- NAD, alert and oriented x3 MSK- Right ankle, chronic mild swelling, decreased ROM  Left ankle swelling with small cystic lesion approx 2 inches above ankle, mild TTP, swelling moves with joint. + squeeze test at mid foot decreased ROM left ankle, TTP over lateral malleous       Assessment & Plan:      Problem List Items Addressed This Visit    Chronic ankle pain    chronic pain meds, difficult as he has had multiple internvention on right ankle, not sure much more can be done       Other Visit Diagnoses    Left  ankle injury, initial encounter    -  Primary    concern for fracture or chip off malleous region, obtain xray, defintely has sprain, given Mobic daily, changed to percocet short term only,    Relevant Orders    DG Foot Complete Left    Ganglion cyst of foot        Possible ganglion cyst after injury, send to ortho to evaluate    Relevant Orders    DG Foot Complete Left       Note: This dictation was prepared with Dragon dictation along with smaller phrase technology. Any transcriptional errors that result from this process are unintentional.

## 2016-05-06 NOTE — Patient Instructions (Signed)
Get xrays  Take the pain medication as prescribed Mobic  F/U pending results

## 2016-05-06 NOTE — Assessment & Plan Note (Signed)
chronic pain meds, difficult as he has had multiple internvention on right ankle, not sure much more can be done

## 2016-05-28 ENCOUNTER — Telehealth: Payer: Self-pay | Admitting: *Deleted

## 2016-05-28 MED ORDER — OXYCODONE-ACETAMINOPHEN 5-325 MG PO TABS: 1.0000 | ORAL_TABLET | Freq: Three times a day (TID) | ORAL | Status: DC | PRN

## 2016-05-28 NOTE — Telephone Encounter (Signed)
Noted prescription for Norco is due.   Ok to refill??  Last office visit/ refill 05/06/2016.

## 2016-05-28 NOTE — Telephone Encounter (Signed)
Okay to print but not due until 13th

## 2016-05-28 NOTE — Telephone Encounter (Signed)
Prescription printed and patient made aware to come to office to pick up on 06/04/2016.

## 2016-06-04 MED ORDER — OXYCODONE-ACETAMINOPHEN 5-325 MG PO TABS: 1.0000 | ORAL_TABLET | Freq: Three times a day (TID) | ORAL | Status: DC | PRN

## 2016-06-04 NOTE — Telephone Encounter (Signed)
Patient spouse in office to pick up prescription   Prescription is not in file for patient to pick up.   Prescription printed again.

## 2016-06-04 NOTE — Addendum Note (Signed)
Addended by: Sheral Flow on: 06/04/2016 10:06 AM   Modules accepted: Orders

## 2016-07-07 ENCOUNTER — Telehealth: Payer: Self-pay | Admitting: Family Medicine

## 2016-07-07 NOTE — Telephone Encounter (Signed)
Okay to refill? 

## 2016-07-07 NOTE — Telephone Encounter (Signed)
Ok to refill??  Last office visit 05/06/2016.  Last refill 06/04/2016.

## 2016-07-07 NOTE — Telephone Encounter (Signed)
Seth Bake calling to request a refill on patient's oxyCODONE-acetaminophen (ROXICET) 5-325 MG   CB# [phone]

## 2016-07-08 NOTE — Telephone Encounter (Signed)
Call placed to patient.   Spoke with David Curtis. Reports that she picked up prescription on 07/07/2016 and had prescription filled from [facility].   Writer is not able to determine if patient picked up old prescription at this time.

## 2016-07-09 ENCOUNTER — Encounter: Payer: Self-pay | Admitting: Family Medicine

## 2016-07-09 ENCOUNTER — Ambulatory Visit (INDEPENDENT_AMBULATORY_CARE_PROVIDER_SITE_OTHER): Payer: Medicare Other | Admitting: Family Medicine

## 2016-07-09 ENCOUNTER — Ambulatory Visit (HOSPITAL_COMMUNITY)
Admission: RE | Admit: 2016-07-09 | Discharge: 2016-07-09 | Disposition: A | Discharge status: Home / Self Care | Payer: Medicare Other | Source: Ambulatory Visit | Attending: Family Medicine | Admitting: Family Medicine

## 2016-07-09 VITALS — BP 150/98 | HR 76 | Temp 97.6°F | Resp 16 | Ht 68.0 in | Wt 166.0 lb

## 2016-07-09 DIAGNOSIS — F17200 Nicotine dependence, unspecified, uncomplicated: Secondary | ICD-10-CM

## 2016-07-09 DIAGNOSIS — J209 Acute bronchitis, unspecified: Secondary | ICD-10-CM | POA: Insufficient documentation

## 2016-07-09 DIAGNOSIS — R918 Other nonspecific abnormal finding of lung field: Secondary | ICD-10-CM

## 2016-07-09 DIAGNOSIS — I10 Essential (primary) hypertension: Secondary | ICD-10-CM

## 2016-07-09 DIAGNOSIS — R05 Cough: Secondary | ICD-10-CM | POA: Diagnosis not present

## 2016-07-09 DIAGNOSIS — R911 Solitary pulmonary nodule: Secondary | ICD-10-CM | POA: Insufficient documentation

## 2016-07-09 MED ORDER — METHYLPREDNISOLONE ACETATE 40 MG/ML IJ SUSP
40.0000 mg | Freq: Once | INTRAMUSCULAR | Status: AC
  Administered 2016-07-09: 40 mg via INTRAMUSCULAR

## 2016-07-09 MED ORDER — AZITHROMYCIN 250 MG PO TABS: ORAL_TABLET | ORAL | 0 refills | Status: DC

## 2016-07-09 MED ORDER — PREDNISONE 20 MG PO TABS: 40.0000 mg | ORAL_TABLET | Freq: Every day | ORAL | 0 refills | Status: DC

## 2016-07-09 MED ORDER — ALBUTEROL SULFATE HFA 108 (90 BASE) MCG/ACT IN AERS: 2.0000 | INHALATION_SPRAY | RESPIRATORY_TRACT | 1 refills | Status: DC | PRN

## 2016-07-09 NOTE — Assessment & Plan Note (Signed)
Advised to take his BP medication as prescribed  Recheck Monday

## 2016-07-09 NOTE — Patient Instructions (Addendum)
Use Mucinex DM Take steroids starting tomorrow Use inhaler every 4 hours  Take antibiotics Get the chest xray F/U Monday

## 2016-07-09 NOTE — Addendum Note (Signed)
Addended by: Vic Blackbird F on: 07/09/2016 01:39 PM   Modules accepted: Orders

## 2016-07-09 NOTE — Progress Notes (Signed)
   Subjective:    Patient ID: David Curtis, male    DOB: Apr 27, 1961, 55 y.o.   MRN: [id number]  Patient presents for Shortness of Breath (X 1 month ); Other (Chest congestion ); and Cough Year with recurrent episodes of shortness of breath, cough and wheezing over the past month. He is a smoker. He does get some mild phlegm production. He has not had any fever but states he just feels bad in general. He gets tightness in his chest and has some shortness of breath when he tries to lay down at night. He has not tried anything for the coughing fits. This time his episodes of last the past few days so he came in for evaluation. He denies any radiating chest pain and nausea vomiting or diaphoresis  Note he has not taken his blood pressure medication   Review Of Systems:  GEN- denies fatigue, fever, weight loss,weakness, recent illness HEENT- denies eye drainage, change in vision, nasal discharge, CVS- denies chest pain, palpitations RESP- +SOB, +cough, +wheeze ABD- denies N/V, change in stools, abd pain GU- denies dysuria, hematuria, dribbling, incontinence MSK- denies joint pain, muscle aches, injury Neuro- denies headache, dizziness, syncope, seizure activity       Objective:    BP (!) 150/98   Pulse 76   Temp 97.6 F (36.4 C) (Oral)   Resp 16   Ht 5\' 8"  (1.727 m)   Wt 166 lb (75.3 kg)   SpO2 95%   BMI 25.24 kg/m  GEN- NAD, alert and oriented x3 HEENT- PERRL, EOMI, non injected sclera, pink conjunctiva, MMM, oropharynx clear Neck- Supple, no LAD  CVS- RRR, no murmur RESP-BILAT wheeze, bronchospasm, rhonchi bilat at bases, decreased air movement  ABD-NABS,soft,NT,ND EXT-chronic right edema edema Pulses- Radial  2+   S/P neb improved WOB decreased wheeze and bronchospasm, air movement to bases      Assessment & Plan:      Problem List Items Addressed This Visit    Tobacco use disorder - Primary   Relevant Orders   DG Chest 2 View    Other Visit Diagnoses    Acute  bronchitis, unspecified organism       Concern for acute on chronic bronchitis, obtain CXR, discussed tobacco cessation,given albuterol neb in office, responded well, Depo Medrol given IM,  albterol inhaler, prednisone burst and Zpak prescribed MuCINEX DM for cough Recheck on Monday    Relevant Orders   DG Chest 2 View      Note: This dictation was prepared with Dragon dictation along with smaller phrase technology. Any transcriptional errors that result from this process are unintentional.

## 2016-07-09 NOTE — Addendum Note (Signed)
Addended by: Shary Decamp B on: 07/09/2016 12:43 PM   Modules accepted: Orders

## 2016-07-11 ENCOUNTER — Ambulatory Visit (HOSPITAL_COMMUNITY)
Admission: RE | Admit: 2016-07-11 | Discharge: 2016-07-11 | Disposition: A | Discharge status: Home / Self Care | Payer: Medicare Other | Source: Ambulatory Visit | Attending: Family Medicine | Admitting: Family Medicine

## 2016-07-11 DIAGNOSIS — R918 Other nonspecific abnormal finding of lung field: Secondary | ICD-10-CM | POA: Insufficient documentation

## 2016-07-14 ENCOUNTER — Encounter: Payer: Self-pay | Admitting: Family Medicine

## 2016-07-14 ENCOUNTER — Ambulatory Visit (INDEPENDENT_AMBULATORY_CARE_PROVIDER_SITE_OTHER): Payer: Medicare Other | Admitting: Family Medicine

## 2016-07-14 VITALS — BP 136/72 | HR 84 | Temp 98.4°F | Resp 18 | Ht 68.0 in | Wt 168.0 lb

## 2016-07-14 DIAGNOSIS — R918 Other nonspecific abnormal finding of lung field: Secondary | ICD-10-CM

## 2016-07-14 DIAGNOSIS — J209 Acute bronchitis, unspecified: Secondary | ICD-10-CM | POA: Diagnosis not present

## 2016-07-14 DIAGNOSIS — F17200 Nicotine dependence, unspecified, uncomplicated: Secondary | ICD-10-CM | POA: Diagnosis not present

## 2016-07-14 MED ORDER — PREDNISONE 10 MG PO TABS: ORAL_TABLET | ORAL | 0 refills | Status: DC

## 2016-07-14 NOTE — Assessment & Plan Note (Signed)
Continue to encourage tobacco cessation. 

## 2016-07-14 NOTE — Patient Instructions (Addendum)
Mucinex DM for congestion  Prednisone to be done  Use inhaler as prescribed F/U 3 months

## 2016-07-14 NOTE — Progress Notes (Signed)
   Subjective:    Patient ID: David Curtis, male    DOB: 07-Jul-1961, 55 y.o.   MRN: [id number]  Patient presents for Follow-up (is fasting) Patient here for interim follow-up on bronchitis. He states that he is improving but he still gets wheezing and chest tightness mostly at night. He finished his prednisone yesterday. He is still using the albuterol every 4 hours his cough is minimal production. He has decreased his smoking since he has been sick. He had chest x-ray that showed no pasty in the right apex yet lordotic view done which still showed some Security in the area therefore CT of chest was recommended    Review Of Systems:  GEN- denies fatigue, fever, weight loss,weakness, recent illness HEENT- denies eye drainage, change in vision, nasal discharge, CVS- denies chest pain, palpitations RESP- + SOB, +cough, +wheeze ABD- denies N/V, change in stools, abd pain Neuro- denies headache, dizziness, syncope, seizure activity       Objective:    BP 136/72 (BP Location: Right Arm, Patient Position: Sitting, Cuff Size: Normal)   Pulse 84   Temp 98.4 F (36.9 C) (Oral)   Resp 18   Ht 5\' 8"  (1.727 m)   Wt 168 lb (76.2 kg)   BMI 25.54 kg/m  GEN- NAD, alert and oriented x3 HEENT- PERRL, EOMI, non injected sclera, pink conjunctiva, MMM, oropharynx clear CVS- RRR, no murmur RESP-few wheeze, good air movment, no rales  Pulses- Radial, 2+        Assessment & Plan:      Problem List Items Addressed This Visit    Tobacco use disorder - Primary    Continue to encourage tobacco cessation      Relevant Orders   CT CHEST WO CONTRAST    Other Visit Diagnoses    Acute bronchitis, unspecified organism       improving, will extend prednisone another few days with a taper, add mucinex DM CT of chest to be done   Opacity of lung on imaging study       Relevant Orders   CT CHEST WO CONTRAST      Note: This dictation was prepared with Dragon dictation along with smaller phrase  technology. Any transcriptional errors that result from this process are unintentional.

## 2016-07-17 ENCOUNTER — Telehealth: Payer: Self-pay | Admitting: *Deleted

## 2016-07-17 NOTE — Telephone Encounter (Signed)
Records indicate prescription refill appropriate for Percocet.  Ok to refill??  Last office visit 07/14/2016.  Last refill 06/04/2016.

## 2016-07-18 ENCOUNTER — Ambulatory Visit (HOSPITAL_COMMUNITY): Payer: Medicare Other

## 2016-07-18 NOTE — Telephone Encounter (Signed)
Not due yet

## 2016-07-24 ENCOUNTER — Ambulatory Visit (HOSPITAL_COMMUNITY)
Admission: RE | Admit: 2016-07-24 | Discharge: 2016-07-24 | Disposition: A | Discharge status: Home / Self Care | Payer: Medicare Other | Source: Ambulatory Visit | Attending: Family Medicine | Admitting: Family Medicine

## 2016-07-24 DIAGNOSIS — I2510 Atherosclerotic heart disease of native coronary artery without angina pectoris: Secondary | ICD-10-CM | POA: Insufficient documentation

## 2016-07-24 DIAGNOSIS — I700 Atherosclerosis of aorta: Secondary | ICD-10-CM | POA: Diagnosis not present

## 2016-07-24 DIAGNOSIS — F17200 Nicotine dependence, unspecified, uncomplicated: Secondary | ICD-10-CM | POA: Insufficient documentation

## 2016-07-24 DIAGNOSIS — J432 Centrilobular emphysema: Secondary | ICD-10-CM | POA: Diagnosis not present

## 2016-07-24 DIAGNOSIS — J439 Emphysema, unspecified: Secondary | ICD-10-CM | POA: Diagnosis not present

## 2016-07-24 DIAGNOSIS — R918 Other nonspecific abnormal finding of lung field: Secondary | ICD-10-CM | POA: Insufficient documentation

## 2016-07-29 ENCOUNTER — Other Ambulatory Visit: Payer: Self-pay | Admitting: *Deleted

## 2016-07-29 DIAGNOSIS — F17200 Nicotine dependence, unspecified, uncomplicated: Secondary | ICD-10-CM

## 2016-07-29 DIAGNOSIS — R918 Other nonspecific abnormal finding of lung field: Secondary | ICD-10-CM

## 2016-07-29 DIAGNOSIS — I7090 Unspecified atherosclerosis: Secondary | ICD-10-CM

## 2016-08-04 ENCOUNTER — Telehealth: Payer: Self-pay | Admitting: Family Medicine

## 2016-08-04 MED ORDER — OXYCODONE-ACETAMINOPHEN 5-325 MG PO TABS: 1.0000 | ORAL_TABLET | Freq: Three times a day (TID) | ORAL | 0 refills | Status: DC | PRN

## 2016-08-04 NOTE — Telephone Encounter (Signed)
Prescription printed and patient made aware to come to office to pick up after 4 pm on 08/04/2016.

## 2016-08-04 NOTE — Telephone Encounter (Signed)
Ok to refill??  Last office visit 07/14/2016.  Last refill 06/04/2016.

## 2016-08-04 NOTE — Telephone Encounter (Signed)
Seth Bake called requesting patient's oxyCODONE-acetaminophen (ROXICET) 5-325  CB# [phone]

## 2016-08-04 NOTE — Telephone Encounter (Signed)
Okay to refill? 

## 2016-08-13 ENCOUNTER — Other Ambulatory Visit (HOSPITAL_COMMUNITY): Payer: Self-pay | Admitting: Respiratory Therapy

## 2016-08-13 ENCOUNTER — Other Ambulatory Visit: Payer: Medicare Other

## 2016-08-13 DIAGNOSIS — I7090 Unspecified atherosclerosis: Secondary | ICD-10-CM

## 2016-08-13 DIAGNOSIS — J449 Chronic obstructive pulmonary disease, unspecified: Secondary | ICD-10-CM | POA: Diagnosis not present

## 2016-08-13 DIAGNOSIS — I10 Essential (primary) hypertension: Secondary | ICD-10-CM | POA: Diagnosis not present

## 2016-08-13 DIAGNOSIS — R911 Solitary pulmonary nodule: Secondary | ICD-10-CM | POA: Diagnosis not present

## 2016-08-13 DIAGNOSIS — J441 Chronic obstructive pulmonary disease with (acute) exacerbation: Secondary | ICD-10-CM

## 2016-08-22 ENCOUNTER — Ambulatory Visit (HOSPITAL_COMMUNITY): Payer: Medicare Other

## 2016-08-29 ENCOUNTER — Ambulatory Visit (HOSPITAL_COMMUNITY)
Admission: RE | Admit: 2016-08-29 | Discharge: 2016-08-29 | Disposition: A | Discharge status: Home / Self Care | Payer: Medicare Other | Source: Ambulatory Visit | Attending: Pulmonary Disease | Admitting: Pulmonary Disease

## 2016-08-29 DIAGNOSIS — J441 Chronic obstructive pulmonary disease with (acute) exacerbation: Secondary | ICD-10-CM | POA: Diagnosis not present

## 2016-08-29 MED ORDER — ALBUTEROL SULFATE (2.5 MG/3ML) 0.083% IN NEBU
2.5000 mg | INHALATION_SOLUTION | Freq: Once | RESPIRATORY_TRACT | Status: AC
  Administered 2016-08-29: 2.5 mg via RESPIRATORY_TRACT

## 2016-08-31 LAB — PULMONARY FUNCTION TEST
DL/VA % pred: 78 %
DL/VA: 3.55 ml/min/mmHg/L
DLCO UNC % PRED: 61 %
DLCO unc: 18.36 ml/min/mmHg
FEF 25-75 PRE: 1.59 L/s
FEF 25-75 Post: 2.93 L/sec
FEF2575-%CHANGE-POST: 84 %
FEF2575-%PRED-POST: 100 %
FEF2575-%Pred-Pre: 54 %
FEV1-%CHANGE-POST: 16 %
FEV1-%PRED-POST: 93 %
FEV1-%Pred-Pre: 79 %
FEV1-POST: 2.79 L
FEV1-Pre: 2.39 L
FEV1FVC-%CHANGE-POST: 8 %
FEV1FVC-%PRED-PRE: 90 %
FEV6-%Change-Post: 7 %
FEV6-%PRED-POST: 98 %
FEV6-%Pred-Pre: 91 %
FEV6-Post: 3.6 L
FEV6-Pre: 3.35 L
FEV6FVC-%Pred-Post: 103 %
FEV6FVC-%Pred-Pre: 103 %
FVC-%Change-Post: 7 %
FVC-%PRED-POST: 94 %
FVC-%PRED-PRE: 88 %
FVC-PRE: 3.35 L
FVC-Post: 3.6 L
POST FEV1/FVC RATIO: 78 %
PRE FEV6/FVC RATIO: 100 %
Post FEV6/FVC ratio: 100 %
Pre FEV1/FVC ratio: 71 %
RV % PRED: 136 %
RV: 2.79 L
TLC % pred: 94 %
TLC: 6.2 L

## 2016-09-03 ENCOUNTER — Telehealth: Payer: Self-pay | Admitting: Family Medicine

## 2016-09-03 MED ORDER — OXYCODONE-ACETAMINOPHEN 5-325 MG PO TABS: 1.0000 | ORAL_TABLET | Freq: Three times a day (TID) | ORAL | 0 refills | Status: DC | PRN

## 2016-09-03 NOTE — Telephone Encounter (Signed)
Ok to refill??  Last office visit 07/14/2016.  Last refill 08/04/2016.

## 2016-09-03 NOTE — Telephone Encounter (Signed)
Patient calling to get rx for oxycodone [phone] (M)

## 2016-09-03 NOTE — Telephone Encounter (Signed)
Prescription printed and patient made aware to come to office to pick up on 09/04/2016. 

## 2016-09-03 NOTE — Telephone Encounter (Signed)
Okay to refill? 

## 2016-09-17 ENCOUNTER — Encounter (HOSPITAL_COMMUNITY): Payer: Self-pay | Admitting: *Deleted

## 2016-09-17 ENCOUNTER — Emergency Department (HOSPITAL_COMMUNITY): Payer: Medicare Other

## 2016-09-17 ENCOUNTER — Emergency Department (HOSPITAL_COMMUNITY)
Admission: EM | Admit: 2016-09-17 | Discharge: 2016-09-17 | Disposition: A | Discharge status: Home / Self Care | Payer: Medicare Other | Attending: Emergency Medicine | Admitting: Emergency Medicine

## 2016-09-17 DIAGNOSIS — F17210 Nicotine dependence, cigarettes, uncomplicated: Secondary | ICD-10-CM | POA: Diagnosis not present

## 2016-09-17 DIAGNOSIS — J449 Chronic obstructive pulmonary disease, unspecified: Secondary | ICD-10-CM | POA: Insufficient documentation

## 2016-09-17 DIAGNOSIS — I10 Essential (primary) hypertension: Secondary | ICD-10-CM | POA: Diagnosis not present

## 2016-09-17 DIAGNOSIS — J9801 Acute bronchospasm: Secondary | ICD-10-CM | POA: Insufficient documentation

## 2016-09-17 DIAGNOSIS — Z79899 Other long term (current) drug therapy: Secondary | ICD-10-CM | POA: Insufficient documentation

## 2016-09-17 DIAGNOSIS — J209 Acute bronchitis, unspecified: Secondary | ICD-10-CM | POA: Diagnosis not present

## 2016-09-17 DIAGNOSIS — R0602 Shortness of breath: Secondary | ICD-10-CM | POA: Diagnosis not present

## 2016-09-17 HISTORY — DX: Chronic obstructive pulmonary disease, unspecified: J44.9

## 2016-09-17 LAB — BASIC METABOLIC PANEL
Anion gap: 6 (ref 5–15)
BUN: 9 mg/dL (ref 6–20)
CO2: 24 mmol/L (ref 22–32)
Calcium: 9.2 mg/dL (ref 8.9–10.3)
Chloride: 105 mmol/L (ref 101–111)
Creatinine, Ser: 0.98 mg/dL (ref 0.61–1.24)
GFR calc Af Amer: >60 mL/min (ref >60)
GLUCOSE: 84 mg/dL (ref 65–99)
POTASSIUM: 3.9 mmol/L (ref 3.5–5.1)
Sodium: 135 mmol/L (ref 135–145)

## 2016-09-17 LAB — CBC WITH DIFFERENTIAL/PLATELET
BASOS PCT: 1 %
Basophils Absolute: 0 10*3/uL (ref 0.0–0.1)
Eosinophils Absolute: 0.2 10*3/uL (ref 0.0–0.7)
Eosinophils Relative: 2 %
HEMATOCRIT: 40.6 % (ref 39.0–52.0)
Hemoglobin: 13.7 g/dL (ref 13.0–17.0)
Lymphocytes Relative: 30 %
Lymphs Abs: 2.4 10*3/uL (ref 0.7–4.0)
MCH: 32.1 pg (ref 26.0–34.0)
MCHC: 33.7 g/dL (ref 30.0–36.0)
MCV: 95.1 fL (ref 78.0–100.0)
MONO ABS: 0.8 10*3/uL (ref 0.1–1.0)
MONOS PCT: 10 %
NEUTROS ABS: 4.7 10*3/uL (ref 1.7–7.7)
Neutrophils Relative %: 57 %
Platelets: 282 10*3/uL (ref 150–400)
RBC: 4.27 MIL/uL (ref 4.22–5.81)
RDW: 13.6 % (ref 11.5–15.5)
WBC: 8.1 10*3/uL (ref 4.0–10.5)

## 2016-09-17 LAB — D-DIMER, QUANTITATIVE: D-Dimer, Quant: 0.28 ug/mL-FEU (ref 0.00–0.50)

## 2016-09-17 LAB — TROPONIN I
Troponin I: <0.03 ng/mL (ref <0.03)
Troponin I: <0.03 ng/mL (ref <0.03)

## 2016-09-17 MED ORDER — AEROCHAMBER Z-STAT PLUS/MEDIUM MISC: 1.0000 | Freq: Once | Status: DC

## 2016-09-17 MED ORDER — IPRATROPIUM BROMIDE 0.02 % IN SOLN
1.0000 mg | Freq: Once | RESPIRATORY_TRACT | Status: AC
  Administered 2016-09-17: 1 mg via RESPIRATORY_TRACT
  Filled 2016-09-17: qty 5

## 2016-09-17 MED ORDER — ALBUTEROL (5 MG/ML) CONTINUOUS INHALATION SOLN
10.0000 mg/h | INHALATION_SOLUTION | Freq: Once | RESPIRATORY_TRACT | Status: AC
  Administered 2016-09-17: 10 mg/h via RESPIRATORY_TRACT
  Filled 2016-09-17: qty 20

## 2016-09-17 MED ORDER — METHYLPREDNISOLONE SODIUM SUCC 125 MG IJ SOLR
125.0000 mg | Freq: Once | INTRAMUSCULAR | Status: AC
  Administered 2016-09-17: 125 mg via INTRAVENOUS
  Filled 2016-09-17: qty 2

## 2016-09-17 MED ORDER — DOXYCYCLINE HYCLATE 100 MG PO TABS: 100.0000 mg | ORAL_TABLET | Freq: Two times a day (BID) | ORAL | 0 refills | Status: DC

## 2016-09-17 MED ORDER — PREDNISONE 20 MG PO TABS: 40.0000 mg | ORAL_TABLET | Freq: Every day | ORAL | 0 refills | Status: DC

## 2016-09-17 MED ORDER — ALBUTEROL SULFATE HFA 108 (90 BASE) MCG/ACT IN AERS
2.0000 | INHALATION_SPRAY | Freq: Once | RESPIRATORY_TRACT | Status: AC
  Administered 2016-09-17: 2 via RESPIRATORY_TRACT
  Filled 2016-09-17: qty 6.7

## 2016-09-17 NOTE — ED Notes (Signed)
    Patient Saturations on Room Air while Ambulating = 98%

## 2016-09-17 NOTE — ED Triage Notes (Signed)
Short of breath

## 2016-09-17 NOTE — ED Provider Notes (Signed)
[facility] DEPT Provider Note   CSN: [id number] Arrival date & time: 09/17/16  1939     History   Chief Complaint Chief Complaint  Patient presents with  . Shortness of Breath    HPI David Curtis is a 55 y.o. male.  HPI  Pt was seen at [facility].  Per pt, c/o gradual onset and worsening of persistent cough, wheezing and SOB for the past 1 month, worse over the past 2 days.  Describes his symptoms as "they say it's my COPD."  Has been using home MDI without relief. Has been associated with runny/stuffy nose.  Denies CP/palpitations, no back pain, no abd pain, no N/V/D, no fevers, no rash.    Past Medical History:  Diagnosis Date  . Arthritis   . COPD (chronic obstructive pulmonary disease) ([place])   . GERD (gastroesophageal reflux disease)   . Hypertension     Patient Active Problem List   Diagnosis Date Noted  . History of colonic polyps   . Hemorrhoid   . Substance abuse 06/20/2015  . Gastritis 01/09/2015  . Tobacco use disorder 01/09/2015  . Heavy alcohol use 01/09/2015  . Essential hypertension 01/09/2015  . Chronic ankle pain 01/09/2015  . Liver mass 01/02/2015  . Pancreatic mass 01/02/2015  . Rectal bleeding 01/02/2015  . Fatigue 01/02/2015  . Loss of weight 01/02/2015  . Abdominal pain 01/02/2015    Past Surgical History:  Procedure Laterality Date  . APPENDECTOMY  2011  . COLONOSCOPY     Approx 2000 in [place], [place]; infectious colitis, no polyps/masses (per patient; records not available)  . COLONOSCOPY WITH PROPOFOL N/A 08/09/2015   [id number] hemorrhoids  . JOINT REPLACEMENT Right    ankle- has been broken 4x  . POLYPECTOMY N/A 08/09/2015   Procedure: POLYPECTOMY;  Surgeon: Daneil Dolin, MD;  Location: [facility];  Service: Endoscopy;  Laterality: N/A;       Home Medications    Prior to Admission medications   Medication Sig Start Date End Date Taking? Authorizing Provider  acetaminophen (TYLENOL) 500 MG tablet Take 500 mg by mouth every 6  (six) hours as needed for mild pain or moderate pain.    Historical Provider, MD  albuterol (PROVENTIL HFA;VENTOLIN HFA) 108 (90 Base) MCG/ACT inhaler Inhale 2 puffs into the lungs every 4 (four) hours as needed for wheezing or shortness of breath. 07/09/16   Alycia Rossetti, MD  hydrocortisone (ANUSOL-HC) 25 MG suppository Place 1 suppository (25 mg total) rectally 2 (two) times daily. 01/02/16   Alycia Rossetti, MD  lisinopril (PRINIVIL,ZESTRIL) 40 MG tablet Take 1 tablet (40 mg total) by mouth daily. 12/31/15   Alycia Rossetti, MD  meloxicam (MOBIC) 7.5 MG tablet Take 1 tablet (7.5 mg total) by mouth daily. 05/06/16   Alycia Rossetti, MD  oxyCODONE-acetaminophen (ROXICET) 5-325 MG tablet Take 1 tablet by mouth every 8 (eight) hours as needed for severe pain. 09/03/16   Alycia Rossetti, MD  pantoprazole (PROTONIX) 40 MG tablet Take 40 mg by mouth daily. Reported on 05/06/2016    Historical Provider, MD  predniSONE (DELTASONE) 10 MG tablet Take 20mg  x 2 days, then 10mg  x 2 days, 07/14/16   Alycia Rossetti, MD    Family History Family History  Problem Relation Age of Onset  . Arthritis Mother   . Cancer Mother     unknown type  . Diabetes Mother   . Hypertension Mother   . Arthritis Sister   . Cancer Sister  unknown type, possibly breast CA  . COPD Brother   . Arthritis Sister   . Arthritis Sister   . Arthritis Sister   . Colon cancer Neg Hx   . Pancreatic cancer Neg Hx     Social History Social History  Substance Use Topics  . Smoking status: Current Every Day Smoker    Packs/day: 3.00    Years: 40.00    Types: Cigarettes  . Smokeless tobacco: Former Systems developer  . Alcohol use 7.2 oz/week    12 Cans of beer per week     Comment: 1/2 pint and six pack a day.     Allergies   Review of patient's allergies indicates no known allergies.   Review of Systems Review of Systems ROS: Statement: All systems negative except as marked or noted in the HPI; Constitutional: Negative for  fever and chills. ; ; Eyes: Negative for eye pain, redness and discharge. ; ; ENMT: Negative for ear pain, hoarseness, nasal congestion, sinus pressure and sore throat. ; ; Cardiovascular: Negative for chest pain, palpitations, diaphoresis, and peripheral edema. ; ; Respiratory: +cough, wheezing, SOB. Negative for stridor. ; ; Gastrointestinal: Negative for nausea, vomiting, diarrhea, abdominal pain, blood in stool, hematemesis, jaundice and rectal bleeding. . ; ; Genitourinary: Negative for dysuria, flank pain and hematuria. ; ; Musculoskeletal: Negative for back pain and neck pain. Negative for swelling and trauma.; ; Skin: Negative for pruritus, rash, abrasions, blisters, bruising and skin lesion.; ; Neuro: Negative for headache, lightheadedness and neck stiffness. Negative for weakness, altered level of consciousness, altered mental status, extremity weakness, paresthesias, involuntary movement, seizure and syncope.       Physical Exam Updated Vital Signs BP (!) 210/100 (BP Location: Left Arm)   Pulse 64   Temp 97.6 F (36.4 C) (Oral)   Resp 16   SpO2 100%   Physical Exam 1950: Physical examination:  Nursing notes reviewed; Vital signs and O2 SAT reviewed;  Constitutional: Well developed, Well nourished, Well hydrated, Uncomfortable appearing.; Head:  Normocephalic, atraumatic; Eyes: EOMI, PERRL, No scleral icterus; ENMT: TM's clear bilat. +edemetous nasal turbinates bilat with clear rhinorrhea. Mouth and pharynx normal, Mucous membranes moist; Neck: Supple, Full range of motion, No lymphadenopathy; Cardiovascular: Regular rate and rhythm, No gallop; Respiratory: Breath sounds coarse & equal bilaterally, faint scattered wheezes.  Speaking full sentences with ease, Normal respiratory effort/excursion; Chest: Nontender, Movement normal; Abdomen: Soft, Nontender, Nondistended, Normal bowel sounds; Genitourinary: No CVA tenderness; Extremities: Pulses normal, No tenderness, No edema, No calf edema or  asymmetry.; Neuro: AA&Ox3, Major CN grossly intact.  Speech clear. No gross focal motor or sensory deficits in extremities.; Skin: Color normal, Warm, Dry.   ED Treatments / Results  Labs (all labs ordered are listed, but only abnormal results are displayed)   EKG  EKG Interpretation  Date/Time:  Wednesday September 17 2016 21:18:44 EDT Ventricular Rate:  65 PR Interval:    QRS Duration: 88 QT Interval:  424 QTC Calculation: 441 R Axis:   30 Text Interpretation:  Sinus rhythm Borderline ST elevation, anterior leads When compared with ECG of 01/23/2015 No significant change was found Confirmed by [facility]  MD, Nunzio Cory [phone]) on 09/17/2016 9:24:27 PM       Radiology   Procedures Procedures (including critical care time)  Medications Ordered in ED Medications  methylPREDNISolone sodium succinate (SOLU-MEDROL) 125 mg/2 mL injection 125 mg (not administered)  albuterol (PROVENTIL,VENTOLIN) solution continuous neb (not administered)  ipratropium (ATROVENT) nebulizer solution 1 mg (not administered)  Initial Impression / Assessment and Plan / ED Course  I have reviewed the triage vital signs and the nursing notes.  Pertinent labs & imaging results that were available during my care of the patient were reviewed by me and considered in my medical decision making (see chart for details).  MDM Reviewed: previous chart, nursing note and vitals Reviewed previous: labs and ECG Interpretation: labs, ECG and x-ray   Results for orders placed or performed during the hospital encounter of [id number]  Basic metabolic panel  Result Value Ref Range   Sodium 135 135 - 145 mmol/L   Potassium 3.9 3.5 - 5.1 mmol/L   Chloride 105 101 - 111 mmol/L   CO2 24 22 - 32 mmol/L   Glucose, Bld 84 65 - 99 mg/dL   BUN 9 6 - 20 mg/dL   Creatinine, Ser 0.98 0.61 - 1.24 mg/dL   Calcium 9.2 8.9 - 10.3 mg/dL   GFR calc non Af Amer >60 >60 mL/min   GFR calc Af Amer >60 >60 mL/min   Anion gap 6 5 - 15   Troponin I  Result Value Ref Range   Troponin I <0.03 <0.03 ng/mL  CBC with Differential  Result Value Ref Range   WBC 8.1 4.0 - 10.5 K/uL   RBC 4.27 4.22 - 5.81 MIL/uL   Hemoglobin 13.7 13.0 - 17.0 g/dL   HCT 40.6 39.0 - 52.0 %   MCV 95.1 78.0 - 100.0 fL   MCH 32.1 26.0 - 34.0 pg   MCHC 33.7 30.0 - 36.0 g/dL   RDW 13.6 11.5 - 15.5 %   Platelets 282 150 - 400 K/uL   Neutrophils Relative % 57 %   Neutro Abs 4.7 1.7 - 7.7 K/uL   Lymphocytes Relative 30 %   Lymphs Abs 2.4 0.7 - 4.0 K/uL   Monocytes Relative 10 %   Monocytes Absolute 0.8 0.1 - 1.0 K/uL   Eosinophils Relative 2 %   Eosinophils Absolute 0.2 0.0 - 0.7 K/uL   Basophils Relative 1 %   Basophils Absolute 0.0 0.0 - 0.1 K/uL  D-dimer, quantitative  Result Value Ref Range   D-Dimer, Quant 0.28 0.00 - 0.50 ug/mL-FEU   Dg Chest Port 1 View Result Date: 09/17/2016 CLINICAL DATA:  Shortness of Breath EXAM: PORTABLE CHEST 1 VIEW COMPARISON:  07/24/2016 FINDINGS: Cardiac shadow is within normal limits. The lungs are well aerated bilaterally. No focal infiltrate or sizable effusion is seen. Mild central vascular congestion is noted. No pulmonary edema is noted. IMPRESSION: Mild vascular congestion without pulmonary edema. Electronically Signed   By: Inez Catalina M.D.   On: 09/17/2016 20:56    2200:  Pt states he "feels better" after neb and steroid.  NAD, lungs without wheezing, resps easy, speaking full sentences, Sats 100% R/A.  Pt ambulated around the ED with Sats remaining 98 % R/A, resps easy, NAD. Will obtain delta troponin. If negative, tx symptomatically. Sign out to Dr. Eulis Foster.     Final Clinical Impressions(s) / ED Diagnoses   Final diagnoses:  None    New Prescriptions New Prescriptions   No medications on file     Francine Graven, DO 09/17/16 2208

## 2016-09-17 NOTE — Discharge Instructions (Signed)
Take the prescriptions as directed.  Use your albuterol inhaler (2 to 4 puffs) every 4 hours for the next 7 days, then as needed for cough, wheezing, or shortness of breath.  Call your regular medical doctor tomorrow morning to schedule a follow up appointment within the next 2 days.  Return to the Emergency Department immediately sooner if worsening.  ° °

## 2016-09-22 ENCOUNTER — Ambulatory Visit (INDEPENDENT_AMBULATORY_CARE_PROVIDER_SITE_OTHER): Payer: Medicare Other | Admitting: Family Medicine

## 2016-09-22 ENCOUNTER — Encounter: Payer: Self-pay | Admitting: Family Medicine

## 2016-09-22 VITALS — BP 160/88 | HR 80 | Temp 98.7°F | Resp 16 | Ht 68.0 in | Wt 178.0 lb

## 2016-09-22 DIAGNOSIS — M25571 Pain in right ankle and joints of right foot: Secondary | ICD-10-CM

## 2016-09-22 DIAGNOSIS — Z23 Encounter for immunization: Secondary | ICD-10-CM

## 2016-09-22 DIAGNOSIS — G8929 Other chronic pain: Secondary | ICD-10-CM

## 2016-09-22 DIAGNOSIS — F17200 Nicotine dependence, unspecified, uncomplicated: Secondary | ICD-10-CM | POA: Diagnosis not present

## 2016-09-22 DIAGNOSIS — I700 Atherosclerosis of aorta: Secondary | ICD-10-CM | POA: Diagnosis not present

## 2016-09-22 DIAGNOSIS — I10 Essential (primary) hypertension: Secondary | ICD-10-CM | POA: Diagnosis not present

## 2016-09-22 DIAGNOSIS — J449 Chronic obstructive pulmonary disease, unspecified: Secondary | ICD-10-CM | POA: Diagnosis not present

## 2016-09-22 MED ORDER — OXYCODONE-ACETAMINOPHEN 5-325 MG PO TABS: 1.0000 | ORAL_TABLET | Freq: Three times a day (TID) | ORAL | 0 refills | Status: DC | PRN

## 2016-09-22 MED ORDER — UMECLIDINIUM-VILANTEROL 62.5-25 MCG/INH IN AEPB: 1.0000 | INHALATION_SPRAY | Freq: Every day | RESPIRATORY_TRACT | 1 refills | Status: DC

## 2016-09-22 MED ORDER — HYDROCHLOROTHIAZIDE 12.5 MG PO CAPS: 12.5000 mg | ORAL_CAPSULE | Freq: Every day | ORAL | 3 refills | Status: DC

## 2016-09-22 MED ORDER — ALBUTEROL SULFATE (2.5 MG/3ML) 0.083% IN NEBU: 2.5000 mg | INHALATION_SOLUTION | Freq: Four times a day (QID) | RESPIRATORY_TRACT | 1 refills | Status: DC | PRN

## 2016-09-22 NOTE — Patient Instructions (Signed)
Nebulizer machine Pain medication can be filled in November  Hctz with lisinopril  F/U 1 month- Come fasting

## 2016-09-22 NOTE — Assessment & Plan Note (Signed)
He is going to need statin therapy, discussed smoking implications in this as well

## 2016-09-22 NOTE — Assessment & Plan Note (Addendum)
counsled on cessation,he is cutting back

## 2016-09-22 NOTE — Assessment & Plan Note (Signed)
Pain medication refilled can not get until Nov 11.

## 2016-09-22 NOTE — Assessment & Plan Note (Addendum)
Continue follow up with pulmonary Patient for nebulizer machine which she would benefit from he's had been treated in my office as well as the emergency room for exacerbations. Continue with his preventative medication

## 2016-09-22 NOTE — Progress Notes (Signed)
   Subjective:    Patient ID: David Curtis, male    DOB: 12-20-1960, 55 y.o.   MRN: [id number]  Patient presents for Follow-up (is not fasting) and Breathing Difficulties (has been seen in ER and by Pulmonology for SOB and difficulty catching his breath) Patient for follow-up. He was seen in the emergency room on 10/25 for COPD exacerbation. I had referred him to pulmonary for concern for interstitial lung disease versus obstructive disease after CT scan of chest showed chronic inflammation of the bronchials.. His diagnosis was COPD with smoking bronchiolitis. He was started on a Anora. In the emergency room he was treated with nebulizer as well as prednisone 40 mg for 5 days and he was given doxycycline His CT scan also showed atherosclerosis and he is due for lipid check so that we can start him on a statin drugs he is at higher risk for heart disease in the setting of his smoking as well.  HTN- states taking lisinopril regulary, reviwed CT scan with him, showing arteriosclerosis in aorta  Review Of Systems:  GEN- denies fatigue, fever, weight loss,weakness, recent illness HEENT- denies eye drainage, change in vision, nasal discharge, CVS- denies chest pain, palpitations RESP- denies SOB, cough, wheeze ABD- denies N/V, change in stools, abd pain Neuro- denies headache, dizziness, syncope, seizure activity       Objective:    BP (!) 160/88   Pulse 80   Temp 98.7 F (37.1 C) (Oral)   Resp 16   Ht 5\' 8"  (1.727 m)   Wt 178 lb (80.7 kg)   SpO2 99%   BMI 27.06 kg/m  GEN- NAD, alert and oriented x3 HEENT- PERRL, EOMI, non injected sclera, pink conjunctiva, MMM, oropharynx clear CVS- RRR, no murmur RESP-CTAB Pulses- Radial  2+        Assessment & Plan:      Problem List Items Addressed This Visit    Tobacco use disorder    counsled on cessation,he is cutting back      Essential hypertension    Uncontrolled add HCTZ to the lisinopril  Plan for Lipid panel next visit in  4 weeks for BP follow up      Relevant Medications   hydrochlorothiazide (MICROZIDE) 12.5 MG capsule   COPD (chronic obstructive pulmonary disease) (HCC) - Primary    Continue follow up with pulmonary Patient for nebulizer machine which she would benefit from he's had been treated in my office as well as the emergency room for exacerbations. Continue with his preventative medication      Relevant Medications   umeclidinium-vilanterol (ANORO ELLIPTA) 62.5-25 MCG/INH AEPB   albuterol (PROVENTIL) (2.5 MG/3ML) 0.083% nebulizer solution   Chronic ankle pain    Pain medication refilled can not get until Nov 11.      Atherosclerosis of aorta ([place])    He is going to need statin therapy, discussed smoking implications in this as well      Relevant Medications   hydrochlorothiazide (MICROZIDE) 12.5 MG capsule    Other Visit Diagnoses    Need for prophylactic vaccination and inoculation against influenza       Relevant Orders   Flu Vaccine QUAD 36+ mos PF IM (Fluarix & Fluzone Quad PF) (Completed)      Note: This dictation was prepared with Dragon dictation along with smaller phrase technology. Any transcriptional errors that result from this process are unintentional.

## 2016-09-22 NOTE — Assessment & Plan Note (Addendum)
Uncontrolled add HCTZ to the lisinopril  Plan for Lipid panel next visit in 4 weeks for BP follow up

## 2016-10-01 ENCOUNTER — Ambulatory Visit: Payer: Medicare Other | Admitting: Family Medicine

## 2016-10-09 DIAGNOSIS — J449 Chronic obstructive pulmonary disease, unspecified: Secondary | ICD-10-CM | POA: Diagnosis not present

## 2016-10-31 ENCOUNTER — Telehealth: Payer: Self-pay | Admitting: Family Medicine

## 2016-10-31 MED ORDER — OXYCODONE-ACETAMINOPHEN 5-325 MG PO TABS: 1.0000 | ORAL_TABLET | Freq: Three times a day (TID) | ORAL | 0 refills | Status: DC | PRN

## 2016-10-31 NOTE — Telephone Encounter (Signed)
Patient requesting refill on his oxycodone  CB# [phone]

## 2016-10-31 NOTE — Telephone Encounter (Signed)
Prescription printed and patient made aware to come to office to pick up after 2pm on 10/31/2016. 

## 2016-10-31 NOTE — Telephone Encounter (Signed)
OKAY...

## 2016-10-31 NOTE — Telephone Encounter (Signed)
Ok to refill??  Last office visit/ refill 09/22/2016.

## 2016-11-06 ENCOUNTER — Telehealth: Payer: Self-pay | Admitting: *Deleted

## 2016-11-06 NOTE — Telephone Encounter (Addendum)
Received call from patient.   Reports that his Percocet prescription has been picked up from office and filled at [facility] by fiance. States that she is holding prescription, but will not give to patient.   Of note, prescription was printed and picked up from [facility] office on 10/31/2016 by fiance. [facility] reports that prescription was filled and picked up on 10/31/2016. Chase, pharmacy tech at [facility] reports that signature for prescription is not legible, but it could be fiance's name.   Requested new prescription for Percocet.   Advised MD is out of office and will return on 11/07/2016. Requested to discuss with MD.   Appointment scheduled.

## 2016-11-07 ENCOUNTER — Ambulatory Visit (INDEPENDENT_AMBULATORY_CARE_PROVIDER_SITE_OTHER): Payer: Medicare Other | Admitting: Family Medicine

## 2016-11-07 DIAGNOSIS — Z79891 Long term (current) use of opiate analgesic: Secondary | ICD-10-CM

## 2016-11-07 NOTE — Telephone Encounter (Signed)
noted 

## 2016-11-08 DIAGNOSIS — J449 Chronic obstructive pulmonary disease, unspecified: Secondary | ICD-10-CM | POA: Diagnosis not present

## 2016-11-10 NOTE — Progress Notes (Signed)
NO show

## 2016-12-09 DIAGNOSIS — J449 Chronic obstructive pulmonary disease, unspecified: Secondary | ICD-10-CM | POA: Diagnosis not present

## 2017-01-09 DIAGNOSIS — J449 Chronic obstructive pulmonary disease, unspecified: Secondary | ICD-10-CM | POA: Diagnosis not present

## 2017-02-06 DIAGNOSIS — J449 Chronic obstructive pulmonary disease, unspecified: Secondary | ICD-10-CM | POA: Diagnosis not present

## 2017-02-10 ENCOUNTER — Other Ambulatory Visit: Payer: Self-pay | Admitting: Family Medicine

## 2017-02-10 ENCOUNTER — Ambulatory Visit (INDEPENDENT_AMBULATORY_CARE_PROVIDER_SITE_OTHER): Payer: Medicare Other | Admitting: Family Medicine

## 2017-02-10 VITALS — BP 148/92 | HR 98 | Temp 98.7°F | Resp 18 | Ht 68.0 in | Wt 175.0 lb

## 2017-02-10 DIAGNOSIS — R42 Dizziness and giddiness: Secondary | ICD-10-CM | POA: Diagnosis not present

## 2017-02-10 DIAGNOSIS — I10 Essential (primary) hypertension: Secondary | ICD-10-CM

## 2017-02-10 DIAGNOSIS — R55 Syncope and collapse: Secondary | ICD-10-CM

## 2017-02-10 DIAGNOSIS — Z79899 Other long term (current) drug therapy: Secondary | ICD-10-CM | POA: Diagnosis not present

## 2017-02-10 DIAGNOSIS — M25571 Pain in right ankle and joints of right foot: Secondary | ICD-10-CM

## 2017-02-10 DIAGNOSIS — J449 Chronic obstructive pulmonary disease, unspecified: Secondary | ICD-10-CM | POA: Diagnosis not present

## 2017-02-10 DIAGNOSIS — G8929 Other chronic pain: Secondary | ICD-10-CM | POA: Diagnosis not present

## 2017-02-10 DIAGNOSIS — J069 Acute upper respiratory infection, unspecified: Secondary | ICD-10-CM | POA: Diagnosis not present

## 2017-02-10 LAB — COMPREHENSIVE METABOLIC PANEL
ALK PHOS: 65 U/L (ref 40–115)
ALT: 15 U/L (ref 9–46)
AST: 17 U/L (ref 10–35)
Albumin: 4.6 g/dL (ref 3.6–5.1)
BUN: 9 mg/dL (ref 7–25)
CHLORIDE: 103 mmol/L (ref 98–110)
CO2: 24 mmol/L (ref 20–31)
CREATININE: 0.96 mg/dL (ref 0.70–1.33)
Calcium: 9.9 mg/dL (ref 8.6–10.3)
GLUCOSE: 89 mg/dL (ref 70–99)
Potassium: 3.9 mmol/L (ref 3.5–5.3)
SODIUM: 138 mmol/L (ref 135–146)
TOTAL PROTEIN: 7.4 g/dL (ref 6.1–8.1)
Total Bilirubin: 0.4 mg/dL (ref 0.2–1.2)

## 2017-02-10 MED ORDER — AZITHROMYCIN 250 MG PO TABS: ORAL_TABLET | ORAL | 0 refills | Status: DC

## 2017-02-10 MED ORDER — OXYCODONE-ACETAMINOPHEN 5-325 MG PO TABS: 1.0000 | ORAL_TABLET | Freq: Three times a day (TID) | ORAL | 0 refills | Status: DC | PRN

## 2017-02-10 MED ORDER — HYDROCHLOROTHIAZIDE 25 MG PO TABS: 25.0000 mg | ORAL_TABLET | Freq: Every day | ORAL | 3 refills | Status: DC

## 2017-02-10 MED ORDER — UMECLIDINIUM-VILANTEROL 62.5-25 MCG/INH IN AEPB: 1.0000 | INHALATION_SPRAY | Freq: Every day | RESPIRATORY_TRACT | 6 refills | Status: DC

## 2017-02-10 MED ORDER — ALBUTEROL SULFATE HFA 108 (90 BASE) MCG/ACT IN AERS: 2.0000 | INHALATION_SPRAY | RESPIRATORY_TRACT | 1 refills | Status: DC | PRN

## 2017-02-10 MED ORDER — LISINOPRIL 40 MG PO TABS: 40.0000 mg | ORAL_TABLET | Freq: Every day | ORAL | 1 refills | Status: DC

## 2017-02-10 NOTE — Progress Notes (Signed)
Subjective:    Patient ID: David Curtis, male    DOB: Dec 19, 1960, 56 y.o.   MRN: [id number]  Patient presents for Vertigo (dizziness) and Illness (ringing in ears, head cold)    Cold symptoms for past 10 days, taking alka seltzer which helped some. No recent fever.Friday AM got up and became dizzy walking up the stairs, he passed out per his friend, he fell down a few stairs. He was able to get up and eat and felt okay. Still when he gets up from the bed, gets a little dizzy, otherwise no dizziness during day. He sits on side of bed And it resolves. He states that he has been taking his blood pressure medication but I query dialysis blood pressure is quite elevated. He is out of his inhalers and he continues to smoke. He denies any heavy alcohol states he does not use any illicit drugs in the past 5 or 6 months. He does request a refill on his pain medication.  On asked why he did not seek care after passing out he states that he felt fine afterwards.   Review Of Systems:  GEN- denies fatigue, fever, weight loss,weakness, recent illness HEENT- denies eye drainage, change in vision, +nasal discharge, CVS- denies chest pain, palpitations RESP- denies SOB, +cough, wheeze ABD- denies N/V, change in stools, abd pain GU- denies dysuria, hematuria, dribbling, incontinence MSK- denies joint pain, muscle aches, injury Neuro- denies headache, +dizziness, +syncope, seizure activity       Objective:    BP (!) 148/92   Pulse 98   Temp 98.7 F (37.1 C) (Oral)   Resp 18   Ht 5\' 8"  (1.727 m)   Wt 175 lb (79.4 kg)   SpO2 98%   BMI 26.61 kg/m  GEN- NAD, alert and oriented x3 HEENT- PERRL, EOMI, non injected sclera, pink conjunctiva, MMM, oropharynx clear Neck- Supple, no bruit  CVS- RRR, no murmur Skin- abrasion left temple region RESP- upper airway congestion, mild scattered wheeze  Psych- normal affect and mood  Neuro-CNII-XII in tact no deficits  EXT- No edema Pulses- Radial  2+   Orthostatics Lying 152/92 , sitting 148/92, standing 148/96      Assessment & Plan:      Problem List Items Addressed This Visit    Essential hypertension    Increase hydrochlorothiazide to 25 mg continue the lisinopril. Labs to be drawn today.  Possible his syncopal event was due to his illness or his blood pressure being elevated he denies being intoxicated that time forcefully he was not evaluated at times so is unclear. He is not having neurological deficits today. If he does have another episode would recommend cardiac and neuro workup. Return in 4 weeks for his medications and blood pressure.      Relevant Medications   hydrochlorothiazide (HYDRODIURIL) 25 MG tablet   lisinopril (PRINIVIL,ZESTRIL) 40 MG tablet   Other Relevant Orders   CBC with Differential/Platelet (Completed)   Comprehensive metabolic panel (Completed)   COPD (chronic obstructive pulmonary disease) ([place])    He has URI that setting of his COPD. No difficulty breathing. No sign of any hypoxia. Restart his inhalers he was also given azithromycin. Reiterated on the tobacco use. As well as alcohol use.      Relevant Medications   umeclidinium-vilanterol (ANORO ELLIPTA) 62.5-25 MCG/INH AEPB   albuterol (PROVENTIL HFA;VENTOLIN HFA) 108 (90 Base) MCG/ACT inhaler   azithromycin (ZITHROMAX) 250 MG tablet   Chronic ankle pain    Drug screen was  done as well as an alcohol level. His last ones have been clean . I did go ahead and give him his prescription       Other Visit Diagnoses    Syncope, unspecified syncope type    -  Primary   Relevant Medications   hydrochlorothiazide (HYDRODIURIL) 25 MG tablet   lisinopril (PRINIVIL,ZESTRIL) 40 MG tablet   Dizziness       Acute URI       Relevant Medications   azithromycin (ZITHROMAX) 250 MG tablet   Long-term use of high-risk medication       Relevant Orders   Drugs of abuse screen w/o alc, rtn urine-sln   Ethanol, Clinical      Note: This dictation was  prepared with Dragon dictation along with smaller phrase technology. Any transcriptional errors that result from this process are unintentional.

## 2017-02-10 NOTE — Patient Instructions (Addendum)
Take antibiotics We will call with results For blood pressure- HCTZ  F/U 4 weeks for blood pressure

## 2017-02-11 ENCOUNTER — Encounter: Payer: Self-pay | Admitting: Family Medicine

## 2017-02-11 LAB — CBC WITH DIFFERENTIAL/PLATELET
BASOS PCT: 0 %
Basophils Absolute: 0 cells/uL (ref 0–200)
EOS ABS: 47 {cells}/uL (ref 15–500)
EOS PCT: 1 %
HCT: 40.4 % (ref 38.5–50.0)
Hemoglobin: 13.7 g/dL (ref 13.0–17.0)
LYMPHS PCT: 47 %
Lymphs Abs: 2209 cells/uL (ref 850–3900)
MCH: 33.4 pg — ABNORMAL HIGH (ref 27.0–33.0)
MCHC: 33.9 g/dL (ref 32.0–36.0)
MCV: 98.5 fL (ref 80.0–100.0)
MONO ABS: 376 {cells}/uL (ref 200–950)
MONOS PCT: 8 %
MPV: 10.6 fL (ref 7.5–12.5)
NEUTROS ABS: 2068 {cells}/uL (ref 1500–7800)
Neutrophils Relative %: 44 %
PLATELETS: 295 10*3/uL (ref 140–400)
RBC: 4.1 MIL/uL — ABNORMAL LOW (ref 4.20–5.80)
RDW: 14.6 % (ref 11.0–15.0)
WBC: 4.7 10*3/uL (ref 3.8–10.8)

## 2017-02-11 NOTE — Assessment & Plan Note (Addendum)
Increase hydrochlorothiazide to 25 mg continue the lisinopril. Labs to be drawn today.  Possible his syncopal event was due to his illness or his blood pressure being elevated he denies being intoxicated that time forcefully he was not evaluated at times so is unclear. He is not having neurological deficits today. If he does have another episode would recommend cardiac and neuro workup. Return in 4 weeks for his medications and blood pressure.

## 2017-02-11 NOTE — Assessment & Plan Note (Signed)
He has URI that setting of his COPD. No difficulty breathing. No sign of any hypoxia. Restart his inhalers he was also given azithromycin. Reiterated on the tobacco use. As well as alcohol use.

## 2017-02-11 NOTE — Assessment & Plan Note (Signed)
Drug screen was done as well as an alcohol level. His last ones have been clean . I did go ahead and give him his prescription

## 2017-02-12 LAB — ETHANOL, CLINICAL
ALCOHOL, ETHYL (B): NOT DETECTED g/dL
ALCOHOL, ETHYL (B): NOT DETECTED mg/dL

## 2017-02-18 LAB — DRUG ABUSE PANEL 10-50, U
AMPHETAMINES (1000 NG/ML SCRN): NEGATIVE
BARBITURATES: NEGATIVE
BENZODIAZEPINES: NEGATIVE
COCAINE METABOLITES: NEGATIVE
MARIJUANA MET (50 NG/ML SCRN): NEGATIVE
METHADONE: NEGATIVE
METHAQUALONE: NEGATIVE
OPIATES: NEGATIVE
PHENCYCLIDINE: NEGATIVE
PROPOXYPHENE: NEGATIVE

## 2017-03-09 DIAGNOSIS — J449 Chronic obstructive pulmonary disease, unspecified: Secondary | ICD-10-CM | POA: Diagnosis not present

## 2017-03-10 ENCOUNTER — Ambulatory Visit: Payer: Medicare Other | Admitting: Family Medicine

## 2017-03-15 ENCOUNTER — Other Ambulatory Visit: Payer: Self-pay | Admitting: Family Medicine

## 2017-04-08 DIAGNOSIS — J449 Chronic obstructive pulmonary disease, unspecified: Secondary | ICD-10-CM | POA: Diagnosis not present

## 2017-04-09 DIAGNOSIS — M19071 Primary osteoarthritis, right ankle and foot: Secondary | ICD-10-CM | POA: Diagnosis not present

## 2017-04-09 DIAGNOSIS — M25571 Pain in right ankle and joints of right foot: Secondary | ICD-10-CM | POA: Diagnosis not present

## 2017-04-14 DIAGNOSIS — M19071 Primary osteoarthritis, right ankle and foot: Secondary | ICD-10-CM | POA: Diagnosis not present

## 2017-04-14 DIAGNOSIS — M25571 Pain in right ankle and joints of right foot: Secondary | ICD-10-CM | POA: Diagnosis not present

## 2017-05-09 DIAGNOSIS — J449 Chronic obstructive pulmonary disease, unspecified: Secondary | ICD-10-CM | POA: Diagnosis not present

## 2017-06-08 DIAGNOSIS — J449 Chronic obstructive pulmonary disease, unspecified: Secondary | ICD-10-CM | POA: Diagnosis not present

## 2017-07-09 DIAGNOSIS — J449 Chronic obstructive pulmonary disease, unspecified: Secondary | ICD-10-CM | POA: Diagnosis not present

## 2018-02-22 ENCOUNTER — Encounter: Payer: Self-pay | Admitting: Physician Assistant

## 2018-02-22 ENCOUNTER — Ambulatory Visit (INDEPENDENT_AMBULATORY_CARE_PROVIDER_SITE_OTHER): Payer: Medicare Other | Admitting: Physician Assistant

## 2018-02-22 ENCOUNTER — Ambulatory Visit: Payer: Medicare Other | Admitting: Physician Assistant

## 2018-02-22 ENCOUNTER — Other Ambulatory Visit: Payer: Self-pay

## 2018-02-22 VITALS — BP 140/88 | HR 98 | Temp 98.8°F | Resp 18 | Ht 68.0 in | Wt 184.8 lb

## 2018-02-22 DIAGNOSIS — A084 Viral intestinal infection, unspecified: Secondary | ICD-10-CM | POA: Diagnosis not present

## 2018-02-22 LAB — COMPLETE METABOLIC PANEL WITH GFR
AG RATIO: 1.6 (calc) (ref 1.0–2.5)
ALKALINE PHOSPHATASE (APISO): 74 U/L (ref 40–115)
ALT: 20 U/L (ref 9–46)
AST: 26 U/L (ref 10–35)
Albumin: 4.4 g/dL (ref 3.6–5.1)
BILIRUBIN TOTAL: 0.4 mg/dL (ref 0.2–1.2)
BUN: 10 mg/dL (ref 7–25)
CO2: 27 mmol/L (ref 20–32)
Calcium: 9 mg/dL (ref 8.6–10.3)
Chloride: 98 mmol/L (ref 98–110)
Creat: 0.96 mg/dL (ref 0.70–1.33)
GFR, Est African American: 101 mL/min/{1.73_m2} (ref >=60)
GFR, Est Non African American: 87 mL/min/{1.73_m2} (ref >=60)
GLOBULIN: 2.8 g/dL (ref 1.9–3.7)
Glucose, Bld: 103 mg/dL — ABNORMAL HIGH (ref 65–99)
POTASSIUM: 3.9 mmol/L (ref 3.5–5.3)
SODIUM: 134 mmol/L — AB (ref 135–146)
Total Protein: 7.2 g/dL (ref 6.1–8.1)

## 2018-02-22 LAB — CBC
HEMATOCRIT: 47.6 % (ref 38.5–50.0)
Hemoglobin: 15.6 g/dL (ref 13.2–17.1)
MCH: 32 pg (ref 27.0–33.0)
MCHC: 32.8 g/dL (ref 32.0–36.0)
MCV: 97.5 fL (ref 80.0–100.0)
Platelets: 170 10*3/uL (ref 140–400)
RBC: 4.88 10*6/uL (ref 4.20–5.80)
RDW: 14.2 % (ref 11.0–15.0)
WBC: 4.3 10*3/uL (ref 3.8–10.8)

## 2018-02-22 MED ORDER — ALBUTEROL SULFATE HFA 108 (90 BASE) MCG/ACT IN AERS: 2.0000 | INHALATION_SPRAY | RESPIRATORY_TRACT | 0 refills | Status: DC | PRN

## 2018-02-22 MED ORDER — LISINOPRIL 40 MG PO TABS: 40.0000 mg | ORAL_TABLET | Freq: Every day | ORAL | 0 refills | Status: DC

## 2018-02-22 MED ORDER — UMECLIDINIUM-VILANTEROL 62.5-25 MCG/INH IN AEPB: 1.0000 | INHALATION_SPRAY | Freq: Every day | RESPIRATORY_TRACT | 0 refills | Status: DC

## 2018-02-22 MED ORDER — ALBUTEROL SULFATE (2.5 MG/3ML) 0.083% IN NEBU: 2.5000 mg | INHALATION_SOLUTION | Freq: Four times a day (QID) | RESPIRATORY_TRACT | 0 refills | Status: DC | PRN

## 2018-02-22 MED ORDER — PROMETHAZINE HCL 25 MG/ML IJ SOLN
25.0000 mg | Freq: Once | INTRAMUSCULAR | Status: AC
  Administered 2018-02-22: 25 mg via INTRAVENOUS

## 2018-02-22 MED ORDER — HYDROCHLOROTHIAZIDE 12.5 MG PO CAPS: 12.5000 mg | ORAL_CAPSULE | Freq: Every day | ORAL | 0 refills | Status: DC

## 2018-02-22 MED ORDER — PROMETHAZINE HCL 25 MG PO TABS: 25.0000 mg | ORAL_TABLET | Freq: Four times a day (QID) | ORAL | 0 refills | Status: DC | PRN

## 2018-02-22 NOTE — Telephone Encounter (Signed)
What dose of Hydrochlorothiazide is the patient suppose to be taking

## 2018-02-22 NOTE — Progress Notes (Addendum)
Patient ID: David Curtis MRN: [id number], DOB: 1961-08-18, 57 y.o. Date of Encounter: @DATE @  Chief Complaint:  Chief Complaint  Patient presents with  . Headache    x 1 week   . Diarrhea    x1 week   . Fatigue    x1 week   . chillis    x1week   . Generalized Body Aches    x1 week     HPI: 57 y.o. year old male  presents with above.   Reports that his girl friend was sick with the exact same type of symptoms.  Says that she was sick first and now she is fine but now he is still sick.  He had tried to help take care of her and now he is the one sick and still sick.  Says that he is having diarrhea.  Says that "everything" he tries to put down it "goes straight through".  He has also been having vomiting.  Says that he had vomiting this morning and he had an episode last night around 11 PM.  Says that the vomiting started last Friday -- about 4 days ago.  Says that -- even to try to eat Jell-O  --- it causes his stomach to cramp and gurgle. The only foods that he has been trying to get in have been Jell-O, soup that is chicken with star-shaped noodles, some plain crackers. Says that his entire stomach feels crampy.  He also has a headache across his forehead. 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   Past Medical History:  Diagnosis Date  . Arthritis   . COPD (chronic obstructive pulmonary disease) ([place])   . GERD (gastroesophageal reflux disease)   . Hypertension      Home Meds: Outpatient Medications Prior to Visit  Medication Sig Dispense Refill  . acetaminophen (TYLENOL) 500 MG tablet Take 500 mg by mouth every 6 (six) hours as needed for mild pain or moderate pain.    Marland Kitchen albuterol (PROVENTIL HFA;VENTOLIN HFA) 108 (90 Base) MCG/ACT inhaler Inhale 2 puffs into the lungs every 4 (four) hours as needed for wheezing or shortness of breath. (Patient not taking: Reported on 02/22/2018) 1 Inhaler 1  . albuterol (PROVENTIL) (2.5 MG/3ML) 0.083%  nebulizer solution Take 3 mLs (2.5 mg total) by nebulization every 6 (six) hours as needed for wheezing or shortness of breath. (Patient not taking: Reported on 02/22/2018) 150 mL 1  . hydrochlorothiazide (HYDRODIURIL) 25 MG tablet Take 1 tablet (25 mg total) by mouth daily. (Patient not taking: Reported on 02/22/2018) 90 tablet 3  . hydrochlorothiazide (MICROZIDE) 12.5 MG capsule TAKE 1 CAPSULE BY MOUTH DAILY. (Patient not taking: Reported on 02/22/2018) 30 capsule 3  . lisinopril (PRINIVIL,ZESTRIL) 40 MG tablet Take 1 tablet (40 mg total) by mouth daily. (Patient not taking: Reported on 02/22/2018) 90 tablet 1  . oxyCODONE-acetaminophen (ROXICET) 5-325 MG tablet Take 1 tablet by mouth every 8 (eight) hours as needed for severe pain. (Patient not taking: Reported on 02/22/2018) 40 tablet 0  . umeclidinium-vilanterol (ANORO ELLIPTA) 62.5-25 MCG/INH AEPB Inhale 1 puff into the lungs daily. (Patient not taking: Reported on 02/22/2018) 30 each 6  . azithromycin (ZITHROMAX) 250 MG tablet Take 2 tablets x 1 day, then 1 tablet daily x 4 days 6 tablet 0   No facility-administered medications prior to visit.     Allergies: No Known Allergies  Social History   Socioeconomic History  .  Marital status: Single    Spouse name: Not on file  . Number of children: Not on file  . Years of education: Not on file  . Highest education level: Not on file  Occupational History  . Not on file  Social Needs  . Financial resource strain: Not on file  . Food insecurity:    Worry: Not on file    Inability: Not on file  . Transportation needs:    Medical: Not on file    Non-medical: Not on file  Tobacco Use  . Smoking status: Current Every Day Smoker    Packs/day: 3.00    Years: 40.00    Pack years: 120.00    Types: Cigarettes  . Smokeless tobacco: Former Network engineer and Sexual Activity  . Alcohol use: Yes    Alcohol/week: 7.2 oz    Types: 12 Cans of beer per week    Comment: 1/2 pint and six pack a day.  .  Drug use: Yes    Types: Marijuana, Cocaine    Comment: 1 month ago, cocaine  . Sexual activity: Yes  Lifestyle  . Physical activity:    Days per week: Not on file    Minutes per session: Not on file  . Stress: Not on file  Relationships  . Social connections:    Talks on phone: Not on file    Gets together: Not on file    Attends religious service: Not on file    Active member of club or organization: Not on file    Attends meetings of clubs or organizations: Not on file    Relationship status: Not on file  . Intimate partner violence:    Fear of current or ex partner: Not on file    Emotionally abused: Not on file    Physically abused: Not on file    Forced sexual activity: Not on file  Other Topics Concern  . Not on file  Social History Narrative  . Not on file    Family History  Problem Relation Age of Onset  . Arthritis Mother   . Cancer Mother        unknown type  . Diabetes Mother   . Hypertension Mother   . Arthritis Sister   . Cancer Sister        unknown type, possibly breast CA  . COPD Brother   . Arthritis Sister   . Arthritis Sister   . Arthritis Sister   . Colon cancer Neg Hx   . Pancreatic cancer Neg Hx      Review of Systems:  See HPI for pertinent ROS. All other ROS negative.    Physical Exam: Blood pressure 140/88, pulse 98, temperature 98.8 F (37.1 C), temperature source Oral, resp. rate 18, height 5\' 8"  (1.727 m), weight 83.8 kg (184 lb 12.8 oz), SpO2 95 %., Body mass index is 28.1 kg/m. General:  WNWD AAM. Appears in no acute distress. Neck: Supple. No thyromegaly. No lymphadenopathy. Lungs: Clear bilaterally to auscultation without wheezes, rales, or rhonchi. Breathing is unlabored. Heart: RRR with S1 S2. No murmurs, rubs, or gallops. Abdomen: Soft,  non-distended with normoactive bowel sounds. No hepatomegaly. No rebound/guarding. No obvious abdominal masses.  He has positive tenderness with palpation throughout entire abdomen.  There is  no area of focal/ localized/increased tenderness with palpation. Musculoskeletal:  Strength and tone normal for age. Extremities/Skin: Warm and dry.  Neuro: Alert and oriented X 3. Moves all extremities spontaneously. Gait is normal. CNII-XII  grossly in tact. Psych:  Responds to questions appropriately with a normal affect.     ASSESSMENT AND PLAN:  57 y.o. year old male with  1. Viral gastroenteritis Will give IV fluids.  Will check CBC here stat and send CMET.  CBC normal with WBC 4.3. Will give Phenergan IV.  Also will give him some oral Phenergan to use after discharge from the office. Is to use the Phenergan to keep his nausea and vomiting controlled.  Use over-the-counter Imodium to help control diarrhea.  Stick with clear liquid diet including small frequent sips of ginger ale and chicken noodle soup.  Follow up if symptoms worsen significantly or develops focal/localized abdominal pain or if symptoms not resolved over the next 48 hours. - CBC--stat--here in office - COMPLETE METABOLIC PANEL WITH GFR - promethazine (PHENERGAN) 25 MG tablet; Take 1 tablet (25 mg total) by mouth every 6 (six) hours as needed for nausea or vomiting.  Dispense: 30 tablet; Refill: 0   Signed, [address] [place], [place], [facility] 02/22/2018 9:46 AM

## 2018-02-22 NOTE — Telephone Encounter (Signed)
Which dose was last filled at pharmacy ? Would refill that dose for 30 days supply unitil f/u.

## 2018-02-24 ENCOUNTER — Other Ambulatory Visit: Payer: Self-pay

## 2018-02-24 ENCOUNTER — Encounter: Payer: Self-pay | Admitting: Family Medicine

## 2018-02-24 ENCOUNTER — Ambulatory Visit (INDEPENDENT_AMBULATORY_CARE_PROVIDER_SITE_OTHER): Payer: Medicare Other | Admitting: Family Medicine

## 2018-02-24 ENCOUNTER — Ambulatory Visit: Payer: Self-pay | Admitting: Physician Assistant

## 2018-02-24 VITALS — BP 128/84 | HR 82 | Temp 98.4°F | Resp 14 | Ht 68.0 in | Wt 182.0 lb

## 2018-02-24 DIAGNOSIS — G8929 Other chronic pain: Secondary | ICD-10-CM

## 2018-02-24 DIAGNOSIS — I10 Essential (primary) hypertension: Secondary | ICD-10-CM | POA: Diagnosis not present

## 2018-02-24 DIAGNOSIS — I700 Atherosclerosis of aorta: Secondary | ICD-10-CM

## 2018-02-24 DIAGNOSIS — J449 Chronic obstructive pulmonary disease, unspecified: Secondary | ICD-10-CM

## 2018-02-24 DIAGNOSIS — M25571 Pain in right ankle and joints of right foot: Secondary | ICD-10-CM

## 2018-02-24 LAB — CBC WITH DIFFERENTIAL/PLATELET
BASOS PCT: 0.7 %
Basophils Absolute: 31 cells/uL (ref 0–200)
EOS PCT: 1.6 %
Eosinophils Absolute: 70 cells/uL (ref 15–500)
HCT: 45.3 % (ref 38.5–50.0)
Hemoglobin: 15.4 g/dL (ref 13.2–17.1)
Lymphs Abs: 2209 cells/uL (ref 850–3900)
MCH: 30.3 pg (ref 27.0–33.0)
MCHC: 34 g/dL (ref 32.0–36.0)
MCV: 89 fL (ref 80.0–100.0)
MONOS PCT: 10.4 %
MPV: 10.4 fL (ref 7.5–12.5)
Neutro Abs: 1632 cells/uL (ref 1500–7800)
Neutrophils Relative %: 37.1 %
PLATELETS: 216 10*3/uL (ref 140–400)
RBC: 5.09 10*6/uL (ref 4.20–5.80)
RDW: 12.9 % (ref 11.0–15.0)
TOTAL LYMPHOCYTE: 50.2 %
WBC mixed population: 458 cells/uL (ref 200–950)
WBC: 4.4 10*3/uL (ref 3.8–10.8)

## 2018-02-24 LAB — COMPREHENSIVE METABOLIC PANEL
AG Ratio: 1.5 (calc) (ref 1.0–2.5)
ALBUMIN MSPROF: 4.1 g/dL (ref 3.6–5.1)
ALT: 37 U/L (ref 9–46)
AST: 46 U/L — ABNORMAL HIGH (ref 10–35)
Alkaline phosphatase (APISO): 69 U/L (ref 40–115)
BUN: 13 mg/dL (ref 7–25)
CO2: 28 mmol/L (ref 20–32)
CREATININE: 0.88 mg/dL (ref 0.70–1.33)
Calcium: 9.4 mg/dL (ref 8.6–10.3)
Chloride: 103 mmol/L (ref 98–110)
Globulin: 2.8 g/dL (calc) (ref 1.9–3.7)
Glucose, Bld: 103 mg/dL — ABNORMAL HIGH (ref 65–99)
POTASSIUM: 4.5 mmol/L (ref 3.5–5.3)
Sodium: 141 mmol/L (ref 135–146)
TOTAL PROTEIN: 6.9 g/dL (ref 6.1–8.1)
Total Bilirubin: 0.3 mg/dL (ref 0.2–1.2)

## 2018-02-24 LAB — LIPID PANEL
CHOL/HDL RATIO: 5.5 (calc) — AB (ref <5.0)
Cholesterol: 225 mg/dL — ABNORMAL HIGH (ref <200)
HDL: 41 mg/dL (ref >40)
LDL CHOLESTEROL (CALC): 156 mg/dL — AB
Non-HDL Cholesterol (Calc): 184 mg/dL (calc) — ABNORMAL HIGH (ref <130)
Triglycerides: 146 mg/dL (ref <150)

## 2018-02-24 MED ORDER — OXYCODONE-ACETAMINOPHEN 5-325 MG PO TABS: 1.0000 | ORAL_TABLET | Freq: Three times a day (TID) | ORAL | 0 refills | Status: DC | PRN

## 2018-02-24 MED ORDER — ALBUTEROL SULFATE (2.5 MG/3ML) 0.083% IN NEBU: 2.5000 mg | INHALATION_SOLUTION | Freq: Four times a day (QID) | RESPIRATORY_TRACT | 0 refills | Status: AC | PRN

## 2018-02-24 MED ORDER — LISINOPRIL 40 MG PO TABS: 40.0000 mg | ORAL_TABLET | Freq: Every day | ORAL | 1 refills | Status: DC

## 2018-02-24 MED ORDER — HYDROCHLOROTHIAZIDE 12.5 MG PO CAPS: 12.5000 mg | ORAL_CAPSULE | Freq: Every day | ORAL | 1 refills | Status: DC

## 2018-02-24 NOTE — Assessment & Plan Note (Signed)
Obtain notes from previous orthopedist and then refer him locally again Refilled pain meds

## 2018-02-24 NOTE — Patient Instructions (Signed)
F/U 4 months Physical  

## 2018-02-24 NOTE — Assessment & Plan Note (Signed)
Check lipids not on statin drug

## 2018-02-24 NOTE — Assessment & Plan Note (Signed)
Controlled no changes to meds 

## 2018-02-24 NOTE — Progress Notes (Signed)
   Subjective:    Patient ID: David Curtis, male    DOB: Mar 31, 1961, 57 y.o.   MRN: [id number]  Patient presents for Medication Management (needs new neb machine and refills) and R Ankle Pain His last visit with me was back a year ago he had actually moved back up [place] to be with his daughter while she was sick he is now returned to the area. He was seen earlier this week with stomach bug though symptoms have now improved   COPD- needs a new nebulizer- he lost his in the move , breathing has not been as good with the change in weather.  He has been using the Anoro but some night still has to use his albuterol.   Internal hemorroids- having some hard stools , no abdominal pain currently , occasionally sees blood when he wipes  Chronic ankle pain multiple surgeries, he has calcium buildup as well.  He saw his previous orthopedic when he was living back up [place] they actually want to go in and do another arthroscopic but he moved back here.  He would like to proceed with having the arthroscopic done because of the pain.  Also requested a refill on his pain medicine         Review Of Systems:  GEN- denies fatigue, fever, weight loss,weakness, recent illness HEENT- denies eye drainage, change in vision, nasal discharge, CVS- denies chest pain, palpitations RESP- denies SOB, +cough, wheeze ABD- denies N/V, change in stools, abd pain GU- denies dysuria, hematuria, dribbling, incontinence MSK- + joint pain, muscle aches, injury Neuro- denies headache, dizziness, syncope, seizure activity       Objective:    BP 128/84   Pulse 82   Temp 98.4 F (36.9 C) (Oral)   Resp 14   Ht 5\' 8"  (1.727 m)   Wt 182 lb (82.6 kg)   SpO2 97%   BMI 27.67 kg/m  GEN- NAD, alert and oriented x3 HEENT- PERRL, EOMI, non injected sclera, pink conjunctiva, MMM, oropharynx clear Neck- Supple, no LAD CVS- RRR, no murmur RESP-few scattered wheeze, normal WOB ABD-NABS,soft,NT,ND EXT- trace chronic right  ankle edema Pulses- Radial  2+        Assessment & Plan:      Problem List Items Addressed This Visit      Unprioritized   Essential hypertension    Controlled no changes to meds       Relevant Medications   hydrochlorothiazide (MICROZIDE) 12.5 MG capsule   lisinopril (PRINIVIL,ZESTRIL) 40 MG tablet   Other Relevant Orders   CBC with Differential/Platelet   Comprehensive metabolic panel   Lipid panel   COPD (chronic obstructive pulmonary disease) (HCC)    Trial of Breo, samples given Can use OTC allergy med as well       Relevant Medications   albuterol (PROVENTIL) (2.5 MG/3ML) 0.083% nebulizer solution   Chronic ankle pain    Obtain notes from previous orthopedist and then refer him locally again Refilled pain meds      Atherosclerosis of aorta (HCC) - Primary    Check lipids not on statin drug       Relevant Medications   hydrochlorothiazide (MICROZIDE) 12.5 MG capsule   lisinopril (PRINIVIL,ZESTRIL) 40 MG tablet   Other Relevant Orders   Lipid panel      Note: This dictation was prepared with Dragon dictation along with smaller phrase technology. Any transcriptional errors that result from this process are unintentional.

## 2018-02-24 NOTE — Assessment & Plan Note (Addendum)
Trial of Breo, samples given Can use OTC allergy med as well  Script for nebulizer machine given

## 2018-03-03 ENCOUNTER — Encounter: Payer: Self-pay | Admitting: *Deleted

## 2018-03-03 MED ORDER — SIMVASTATIN 20 MG PO TABS: 20.0000 mg | ORAL_TABLET | Freq: Every day | ORAL | 3 refills | Status: DC

## 2018-03-29 ENCOUNTER — Other Ambulatory Visit: Payer: Self-pay | Admitting: *Deleted

## 2018-03-29 ENCOUNTER — Other Ambulatory Visit: Payer: Self-pay | Admitting: Family Medicine

## 2018-03-29 MED ORDER — OXYCODONE-ACETAMINOPHEN 5-325 MG PO TABS: 1.0000 | ORAL_TABLET | Freq: Three times a day (TID) | ORAL | 0 refills | Status: DC | PRN

## 2018-03-29 NOTE — Telephone Encounter (Signed)
Pt needs refill on oxycodone to [facility] [address]

## 2018-03-29 NOTE — Telephone Encounter (Signed)
Ok to refill??  Last office visit/ refill 02/24/2018.

## 2018-03-29 NOTE — Telephone Encounter (Signed)
Medication printed in error.   Resubmitted for MD approval.

## 2018-05-05 ENCOUNTER — Other Ambulatory Visit: Payer: Self-pay | Admitting: *Deleted

## 2018-05-05 MED ORDER — ALBUTEROL SULFATE HFA 108 (90 BASE) MCG/ACT IN AERS: 2.0000 | INHALATION_SPRAY | RESPIRATORY_TRACT | 3 refills | Status: AC | PRN

## 2018-05-05 MED ORDER — SIMVASTATIN 20 MG PO TABS: 20.0000 mg | ORAL_TABLET | Freq: Every day | ORAL | 3 refills | Status: DC

## 2018-05-05 MED ORDER — LISINOPRIL 40 MG PO TABS: 40.0000 mg | ORAL_TABLET | Freq: Every day | ORAL | 1 refills | Status: AC

## 2018-05-05 MED ORDER — OXYCODONE-ACETAMINOPHEN 5-325 MG PO TABS: 1.0000 | ORAL_TABLET | Freq: Three times a day (TID) | ORAL | 0 refills | Status: DC | PRN

## 2018-05-05 MED ORDER — HYDROCHLOROTHIAZIDE 12.5 MG PO CAPS: 12.5000 mg | ORAL_CAPSULE | Freq: Every day | ORAL | 1 refills | Status: AC

## 2018-05-05 MED ORDER — FLUTICASONE FUROATE-VILANTEROL 200-25 MCG/INH IN AEPB: 1.0000 | INHALATION_SPRAY | Freq: Every day | RESPIRATORY_TRACT | 3 refills | Status: DC

## 2018-05-05 NOTE — Telephone Encounter (Signed)
Ok to refill??  Last office visit 02/24/2018.   Last refill 03/29/2018.

## 2018-05-20 ENCOUNTER — Emergency Department (HOSPITAL_COMMUNITY): Payer: Medicare Other

## 2018-05-20 ENCOUNTER — Encounter (HOSPITAL_COMMUNITY): Payer: Self-pay

## 2018-05-20 ENCOUNTER — Emergency Department (HOSPITAL_COMMUNITY)
Admission: EM | Admit: 2018-05-20 | Discharge: 2018-05-20 | Disposition: A | Discharge status: Home / Self Care | Payer: Medicare Other | Attending: Emergency Medicine | Admitting: Emergency Medicine

## 2018-05-20 ENCOUNTER — Other Ambulatory Visit: Payer: Self-pay

## 2018-05-20 DIAGNOSIS — S0990XA Unspecified injury of head, initial encounter: Secondary | ICD-10-CM | POA: Diagnosis not present

## 2018-05-20 DIAGNOSIS — I959 Hypotension, unspecified: Secondary | ICD-10-CM | POA: Diagnosis not present

## 2018-05-20 DIAGNOSIS — I10 Essential (primary) hypertension: Secondary | ICD-10-CM | POA: Insufficient documentation

## 2018-05-20 DIAGNOSIS — R079 Chest pain, unspecified: Secondary | ICD-10-CM | POA: Diagnosis not present

## 2018-05-20 DIAGNOSIS — S20211A Contusion of right front wall of thorax, initial encounter: Secondary | ICD-10-CM | POA: Insufficient documentation

## 2018-05-20 DIAGNOSIS — W19XXXA Unspecified fall, initial encounter: Secondary | ICD-10-CM

## 2018-05-20 DIAGNOSIS — J449 Chronic obstructive pulmonary disease, unspecified: Secondary | ICD-10-CM | POA: Diagnosis not present

## 2018-05-20 DIAGNOSIS — Y929 Unspecified place or not applicable: Secondary | ICD-10-CM | POA: Diagnosis not present

## 2018-05-20 DIAGNOSIS — Z79899 Other long term (current) drug therapy: Secondary | ICD-10-CM | POA: Diagnosis not present

## 2018-05-20 DIAGNOSIS — Y939 Activity, unspecified: Secondary | ICD-10-CM | POA: Insufficient documentation

## 2018-05-20 DIAGNOSIS — Y999 Unspecified external cause status: Secondary | ICD-10-CM | POA: Insufficient documentation

## 2018-05-20 DIAGNOSIS — F17210 Nicotine dependence, cigarettes, uncomplicated: Secondary | ICD-10-CM | POA: Diagnosis not present

## 2018-05-20 DIAGNOSIS — W109XXA Fall (on) (from) unspecified stairs and steps, initial encounter: Secondary | ICD-10-CM | POA: Diagnosis not present

## 2018-05-20 DIAGNOSIS — R0781 Pleurodynia: Secondary | ICD-10-CM | POA: Diagnosis not present

## 2018-05-20 DIAGNOSIS — R52 Pain, unspecified: Secondary | ICD-10-CM | POA: Diagnosis not present

## 2018-05-20 DIAGNOSIS — M5489 Other dorsalgia: Secondary | ICD-10-CM | POA: Diagnosis not present

## 2018-05-20 DIAGNOSIS — S3992XA Unspecified injury of lower back, initial encounter: Secondary | ICD-10-CM | POA: Diagnosis not present

## 2018-05-20 DIAGNOSIS — S299XXA Unspecified injury of thorax, initial encounter: Secondary | ICD-10-CM | POA: Diagnosis not present

## 2018-05-20 DIAGNOSIS — M549 Dorsalgia, unspecified: Secondary | ICD-10-CM | POA: Diagnosis not present

## 2018-05-20 DIAGNOSIS — S199XXA Unspecified injury of neck, initial encounter: Secondary | ICD-10-CM | POA: Diagnosis not present

## 2018-05-20 LAB — COMPREHENSIVE METABOLIC PANEL
ALK PHOS: 59 U/L (ref 38–126)
ALT: 18 U/L (ref 0–44)
ANION GAP: 12 (ref 5–15)
AST: 24 U/L (ref 15–41)
Albumin: 3.9 g/dL (ref 3.5–5.0)
BUN: 17 mg/dL (ref 6–20)
CALCIUM: 9.2 mg/dL (ref 8.9–10.3)
CHLORIDE: 103 mmol/L (ref 98–111)
CO2: 19 mmol/L — AB (ref 22–32)
CREATININE: 1.26 mg/dL — AB (ref 0.61–1.24)
Glucose, Bld: 107 mg/dL — ABNORMAL HIGH (ref 70–99)
Potassium: 3.9 mmol/L (ref 3.5–5.1)
SODIUM: 134 mmol/L — AB (ref 135–145)
Total Bilirubin: 0.6 mg/dL (ref 0.3–1.2)
Total Protein: 6.8 g/dL (ref 6.5–8.1)

## 2018-05-20 LAB — CBC WITH DIFFERENTIAL/PLATELET
Abs Immature Granulocytes: 0 10*3/uL (ref 0.0–0.1)
BASOS ABS: 0 10*3/uL (ref 0.0–0.1)
BASOS PCT: 0 %
EOS PCT: 1 %
Eosinophils Absolute: 0.1 10*3/uL (ref 0.0–0.7)
HCT: 40.5 % (ref 39.0–52.0)
Hemoglobin: 13.2 g/dL (ref 13.0–17.0)
Immature Granulocytes: 0 %
LYMPHS PCT: 27 %
Lymphs Abs: 2.5 10*3/uL (ref 0.7–4.0)
MCH: 31.4 pg (ref 26.0–34.0)
MCHC: 32.6 g/dL (ref 30.0–36.0)
MCV: 96.2 fL (ref 78.0–100.0)
MONO ABS: 0.7 10*3/uL (ref 0.1–1.0)
Monocytes Relative: 8 %
NEUTROS ABS: 5.8 10*3/uL (ref 1.7–7.7)
Neutrophils Relative %: 64 %
PLATELETS: 271 10*3/uL (ref 150–400)
RBC: 4.21 MIL/uL — ABNORMAL LOW (ref 4.22–5.81)
RDW: 13.8 % (ref 11.5–15.5)
WBC: 9.2 10*3/uL (ref 4.0–10.5)

## 2018-05-20 MED ORDER — IBUPROFEN 800 MG PO TABS: 800.0000 mg | ORAL_TABLET | Freq: Three times a day (TID) | ORAL | 0 refills | Status: DC | PRN

## 2018-05-20 MED ORDER — MORPHINE SULFATE (PF) 4 MG/ML IV SOLN
4.0000 mg | Freq: Once | INTRAVENOUS | Status: AC
Start: 2018-05-20 — End: 2018-05-20
  Administered 2018-05-20: 4 mg via INTRAVENOUS
  Filled 2018-05-20: qty 1

## 2018-05-20 MED ORDER — CYCLOBENZAPRINE HCL 10 MG PO TABS: 10.0000 mg | ORAL_TABLET | Freq: Three times a day (TID) | ORAL | 0 refills | Status: DC | PRN

## 2018-05-20 MED ORDER — ONDANSETRON HCL 4 MG/2ML IJ SOLN
4.0000 mg | Freq: Once | INTRAMUSCULAR | Status: AC
  Administered 2018-05-20: 4 mg via INTRAVENOUS
  Filled 2018-05-20: qty 2

## 2018-05-20 NOTE — ED Notes (Signed)
Patient transported to CT 

## 2018-05-20 NOTE — ED Notes (Signed)
ED Provider at bedside. 

## 2018-05-20 NOTE — ED Triage Notes (Signed)
Per [facility], pt tripped over dog and had a fall down 14 steps around midnight. Pt denies loss of consciousness. Pt reports having 3 beers around 1800. Pt endorses midline spinal tenderness and right rib tenderness. Pt reports fall 2 weeks ago that resulted in jaw fracture.

## 2018-05-20 NOTE — ED Provider Notes (Signed)
[facility] EMERGENCY DEPARTMENT Provider Note   CSN: [id number] Arrival date & time: 05/20/18  4431     History   Chief Complaint Chief Complaint  Patient presents with  . Fall    HPI David Curtis is a 57 y.o. male.  Patient presents to the emergency department for evaluation after a fall.  Patient reports that he tripped over dog and fell down a flight of stairs.  This occurred 6 hours ago.  Patient complaining of right posterior rib pain.  Pain is sharp, severe.  He reports that it worsens when he moves but feels better if he lies on his right side and puts pressure on the ribs.     Past Medical History:  Diagnosis Date  . Arthritis   . COPD (chronic obstructive pulmonary disease) ([place])   . GERD (gastroesophageal reflux disease)   . Hypertension     Patient Active Problem List   Diagnosis Date Noted  . COPD (chronic obstructive pulmonary disease) ([place]) 09/22/2016  . Atherosclerosis of aorta ([place]) 09/22/2016  . History of colonic polyps   . Hemorrhoid   . Substance abuse ([place]) 06/20/2015  . Gastritis 01/09/2015  . Tobacco use disorder 01/09/2015  . Heavy alcohol use 01/09/2015  . Essential hypertension 01/09/2015  . Chronic ankle pain 01/09/2015  . Liver mass 01/02/2015  . Pancreatic mass 01/02/2015  . Rectal bleeding 01/02/2015  . Fatigue 01/02/2015  . Loss of weight 01/02/2015  . Abdominal pain 01/02/2015    Past Surgical History:  Procedure Laterality Date  . APPENDECTOMY  2011  . COLONOSCOPY     Approx 2000 in [place], [place]; infectious colitis, no polyps/masses (per patient; records not available)  . COLONOSCOPY WITH PROPOFOL N/A 08/09/2015   [id number] hemorrhoids  . JOINT REPLACEMENT Right    ankle- has been broken 4x  . POLYPECTOMY N/A 08/09/2015   Procedure: POLYPECTOMY;  Surgeon: Daneil Dolin, MD;  Location: [facility];  Service: Endoscopy;  Laterality: N/A;        Home Medications    Prior to Admission medications     Medication Sig Start Date End Date Taking? Authorizing Provider  acetaminophen (TYLENOL) 500 MG tablet Take 500 mg by mouth every 6 (six) hours as needed for mild pain or moderate pain.    [provider]  albuterol (PROVENTIL HFA;VENTOLIN HFA) 108 (90 Base) MCG/ACT inhaler Inhale 2 puffs into the lungs every 4 (four) hours as needed for wheezing or shortness of breath. 05/05/18   Alycia Rossetti, MD  albuterol (PROVENTIL) (2.5 MG/3ML) 0.083% nebulizer solution Take 3 mLs (2.5 mg total) by nebulization every 6 (six) hours as needed for wheezing or shortness of breath. 02/24/18   [place], Modena Nunnery, MD  cyclobenzaprine (FLEXERIL) 10 MG tablet Take 1 tablet (10 mg total) by mouth 3 (three) times daily as needed for muscle spasms. 05/20/18   Orpah Greek, MD  fluticasone furoate-vilanterol (BREO ELLIPTA) 200-25 MCG/INH AEPB Inhale 1 puff into the lungs daily. 05/05/18   [place], Modena Nunnery, MD  hydrochlorothiazide (MICROZIDE) 12.5 MG capsule Take 1 capsule (12.5 mg total) by mouth daily. 05/05/18   [place], Modena Nunnery, MD  ibuprofen (ADVIL,MOTRIN) 800 MG tablet Take 1 tablet (800 mg total) by mouth every 8 (eight) hours as needed for moderate pain. 05/20/18   Orpah Greek, MD  lisinopril (PRINIVIL,ZESTRIL) 40 MG tablet Take 1 tablet (40 mg total) by mouth daily. 05/05/18   Alycia Rossetti, MD  oxyCODONE-acetaminophen (ROXICET) 5-325 MG tablet  Take 1 tablet by mouth every 8 (eight) hours as needed for severe pain. 05/05/18   [place], Modena Nunnery, MD  promethazine (PHENERGAN) 25 MG tablet Take 1 tablet (25 mg total) by mouth every 6 (six) hours as needed for nausea or vomiting. 02/22/18   Orlena Sheldon, PA-C  simvastatin (ZOCOR) 20 MG tablet Take 1 tablet (20 mg total) by mouth at bedtime. 05/05/18   Alycia Rossetti, MD    Family History Family History  Problem Relation Age of Onset  . Arthritis Mother   . Cancer Mother        unknown type  . Diabetes Mother   . Hypertension Mother    . Arthritis Sister   . Cancer Sister        unknown type, possibly breast CA  . COPD Brother   . Arthritis Sister   . Arthritis Sister   . Arthritis Sister   . Colon cancer Neg Hx   . Pancreatic cancer Neg Hx     Social History Social History   Tobacco Use  . Smoking status: Current Every Day Smoker    Packs/day: 3.00    Years: 40.00    Pack years: 120.00    Types: Cigarettes  . Smokeless tobacco: Former Network engineer Use Topics  . Alcohol use: Yes    Alcohol/week: 7.2 oz    Types: 12 Cans of beer per week    Comment: 1/2 pint and six pack a day.  . Drug use: Yes    Types: Marijuana, Cocaine    Comment: 1 month ago, cocaine     Allergies   Patient has no known allergies.   Review of Systems Review of Systems  Cardiovascular: Positive for chest pain.  Musculoskeletal: Positive for back pain.  All other systems reviewed and are negative.    Physical Exam Updated Vital Signs BP (!) 149/91   Pulse 87   Temp (!) 97.4 F (36.3 C) (Oral)   Resp 14   Ht 5\' 8"  (1.727 m)   Wt 79.8 kg (176 lb)   SpO2 96%   BMI 26.76 kg/m   Physical Exam  Constitutional: He is oriented to person, place, and time. He appears well-developed and well-nourished. No distress.  HENT:  Head: Normocephalic and atraumatic.  Right Ear: Hearing normal.  Left Ear: Hearing normal.  Nose: Nose normal.  Mouth/Throat: Oropharynx is clear and moist and mucous membranes are normal.  Eyes: Pupils are equal, round, and reactive to light. Conjunctivae and EOM are normal.  Neck: Normal range of motion. Neck supple.  Cardiovascular: Regular rhythm, S1 normal and S2 normal. Exam reveals no gallop and no friction rub.  No murmur heard. Pulmonary/Chest: Effort normal and breath sounds normal. No respiratory distress. He exhibits no tenderness.  Abdominal: Soft. Normal appearance and bowel sounds are normal. There is no hepatosplenomegaly. There is no tenderness. There is no rebound, no guarding,  no tenderness at McBurney's point and negative Murphy's sign. No hernia.  Musculoskeletal: Normal range of motion.       Arms: Neurological: He is alert and oriented to person, place, and time. He has normal strength. No cranial nerve deficit or sensory deficit. Coordination normal. GCS eye subscore is 4. GCS verbal subscore is 5. GCS motor subscore is 6.  Skin: Skin is warm, dry and intact. No rash noted. No cyanosis.  Psychiatric: He has a normal mood and affect. His speech is normal and behavior is normal. Thought content normal.  Nursing note  and vitals reviewed.    ED Treatments / Results  Labs (all labs ordered are listed, but only abnormal results are displayed) Labs Reviewed  CBC WITH DIFFERENTIAL/PLATELET - Abnormal; Notable for the following components:      Result Value   RBC 4.21 (*)    All other components within normal limits  COMPREHENSIVE METABOLIC PANEL - Abnormal; Notable for the following components:   Sodium 134 (*)    CO2 19 (*)    Glucose, Bld 107 (*)    Creatinine, Ser 1.26 (*)    All other components within normal limits    EKG None  Radiology Dg Ribs Unilateral W/chest Right  Result Date: 05/20/2018 CLINICAL DATA:  57 year old male with fall.  Right chest wall pain. EXAM: RIGHT RIBS AND CHEST - 3+ VIEW COMPARISON:  Chest radiograph dated 09/17/2016 and CT dated 07/24/2016 FINDINGS: The lungs are clear. There is no pleural effusion or pneumothorax. Top-normal cardiac size. No acute osseous pathology. IMPRESSION: Negative. Electronically Signed   By: Anner Crete M.D.   On: 05/20/2018 06:58   Dg Thoracic Spine W/swimmers  Result Date: 05/20/2018 CLINICAL DATA:  57 year old male with fall and back pain. EXAM: THORACIC SPINE - 3 VIEWS COMPARISON:  Chest radiograph dated 09/17/2016 and chest CT dated 07/24/2016 FINDINGS: There is no acute fracture or subluxation of the thoracic spine. The soft tissues appear unremarkable. IMPRESSION: No acute/traumatic  thoracic spine pathology. Electronically Signed   By: Anner Crete M.D.   On: 05/20/2018 06:57   Dg Lumbar Spine Complete  Result Date: 05/20/2018 CLINICAL DATA:  57 y/o M; patient tripped over dog in [place] fall down 14 steps. Midline spinal tenderness and right rib tenderness. EXAM: LUMBAR SPINE - COMPLETE 4+ VIEW COMPARISON:  None. FINDINGS: There is no evidence of lumbar spine fracture. Alignment is normal. Intervertebral disc spaces are maintained. Small endplate marginal osteophytes. Mild lower lumbar facet arthrosis. IMPRESSION: 1. No acute fracture or malalignment. 2. Minimal degenerative changes of the lumbar spine. Electronically Signed   By: Kristine Garbe M.D.   On: 05/20/2018 06:49   Ct Head Wo Contrast  Result Date: 05/20/2018 CLINICAL DATA:  57 year old male with fall. EXAM: CT HEAD WITHOUT CONTRAST CT CERVICAL SPINE WITHOUT CONTRAST TECHNIQUE: Multidetector CT imaging of the head and cervical spine was performed following the standard protocol without intravenous contrast. Multiplanar CT image reconstructions of the cervical spine were also generated. COMPARISON:  None. FINDINGS: CT HEAD FINDINGS Brain: The ventricles and sulci appropriate size for patient's age. The gray-white matter discrimination is preserved. There is no acute intracranial hemorrhage. No mass effect or midline shift. No extra-axial fluid collection. Vascular: No hyperdense vessel or unexpected calcification. Skull: Normal. Negative for fracture or focal lesion. Sinuses/Orbits: There is mild mucoperiosteal thickening of paranasal sinuses. No air-fluid level. Mastoid air cells are clear.There is dysconjugate gaze which may represent strabismus. Clinical correlation is recommended. Other: There is slight irregularity of the nasal bridge, likely chronic. Clinical correlation is recommended. Dental caries and left maxillary molar periapical lucency. CT CERVICAL SPINE FINDINGS Alignment: Normal. Skull base and  vertebrae: No acute fracture. No primary bone lesion or focal pathologic process. Soft tissues and spinal canal: No prevertebral fluid or swelling. No visible canal hematoma. Disc levels: No acute findings. No significant degenerative changes. Upper chest: Mild emphysema. Other: Bilateral carotid bulb calcified plaques. IMPRESSION: 1. No acute intracranial pathology. 2. No acute/traumatic cervical spine pathology. Electronically Signed   By: Anner Crete M.D.   On: 05/20/2018 06:32  Ct Cervical Spine Wo Contrast  Result Date: 05/20/2018 CLINICAL DATA:  57 year old male with fall. EXAM: CT HEAD WITHOUT CONTRAST CT CERVICAL SPINE WITHOUT CONTRAST TECHNIQUE: Multidetector CT imaging of the head and cervical spine was performed following the standard protocol without intravenous contrast. Multiplanar CT image reconstructions of the cervical spine were also generated. COMPARISON:  None. FINDINGS: CT HEAD FINDINGS Brain: The ventricles and sulci appropriate size for patient's age. The gray-white matter discrimination is preserved. There is no acute intracranial hemorrhage. No mass effect or midline shift. No extra-axial fluid collection. Vascular: No hyperdense vessel or unexpected calcification. Skull: Normal. Negative for fracture or focal lesion. Sinuses/Orbits: There is mild mucoperiosteal thickening of paranasal sinuses. No air-fluid level. Mastoid air cells are clear.There is dysconjugate gaze which may represent strabismus. Clinical correlation is recommended. Other: There is slight irregularity of the nasal bridge, likely chronic. Clinical correlation is recommended. Dental caries and left maxillary molar periapical lucency. CT CERVICAL SPINE FINDINGS Alignment: Normal. Skull base and vertebrae: No acute fracture. No primary bone lesion or focal pathologic process. Soft tissues and spinal canal: No prevertebral fluid or swelling. No visible canal hematoma. Disc levels: No acute findings. No significant  degenerative changes. Upper chest: Mild emphysema. Other: Bilateral carotid bulb calcified plaques. IMPRESSION: 1. No acute intracranial pathology. 2. No acute/traumatic cervical spine pathology. Electronically Signed   By: Anner Crete M.D.   On: 05/20/2018 06:32    Procedures Procedures (including critical care time)  Medications Ordered in ED Medications  ondansetron (ZOFRAN) injection 4 mg (4 mg Intravenous Given 05/20/18 0555)  morphine 4 MG/ML injection 4 mg (4 mg Intravenous Given 05/20/18 0556)     Initial Impression / Assessment and Plan / ED Course  I have reviewed the triage vital signs and the nursing notes.  Pertinent labs & imaging results that were available during my care of the patient were reviewed by me and considered in my medical decision making (see chart for details).     Presents to the ER for evaluation of right posterior flank and rib area pain after a fall.  Patient reports that he tripped over a dog and fell down a flight of stairs.  He admits to having drank some alcohol earlier, but does not appear intoxicated at this time.  CT head and cervical spine were unremarkable.  Patient did not have significant midline tenderness.  Thoracic and lumbar spine x-rays were negative.  Patient underwent x-ray of right ribs and chest.  No pneumothorax or pulmonary contusion.  No obvious rib fractures.  Patient does not have any abdominal tenderness on examination, no concern for intra-abdominal pathology.  He does not have any evidence of extremity injury.  Patient reassured, treated with analgesia.   Final Clinical Impressions(s) / ED Diagnoses   Final diagnoses:  Rib contusion, right, initial encounter    ED Discharge Orders        Ordered    ibuprofen (ADVIL,MOTRIN) 800 MG tablet  Every 8 hours PRN     05/20/18 0709    cyclobenzaprine (FLEXERIL) 10 MG tablet  3 times daily PRN     05/20/18 0709       Orpah Greek, MD 05/20/18 [phone]

## 2018-05-21 ENCOUNTER — Ambulatory Visit: Payer: Medicare Other | Admitting: Family Medicine

## 2018-05-21 ENCOUNTER — Encounter (HOSPITAL_COMMUNITY): Payer: Self-pay | Admitting: *Deleted

## 2018-05-21 ENCOUNTER — Emergency Department (HOSPITAL_COMMUNITY)
Admission: EM | Admit: 2018-05-21 | Discharge: 2018-05-21 | Disposition: A | Discharge status: Home / Self Care | Payer: Medicare Other | Attending: Emergency Medicine | Admitting: Emergency Medicine

## 2018-05-21 DIAGNOSIS — J449 Chronic obstructive pulmonary disease, unspecified: Secondary | ICD-10-CM | POA: Diagnosis not present

## 2018-05-21 DIAGNOSIS — Y999 Unspecified external cause status: Secondary | ICD-10-CM | POA: Diagnosis not present

## 2018-05-21 DIAGNOSIS — R0789 Other chest pain: Secondary | ICD-10-CM | POA: Diagnosis not present

## 2018-05-21 DIAGNOSIS — W108XXA Fall (on) (from) other stairs and steps, initial encounter: Secondary | ICD-10-CM | POA: Insufficient documentation

## 2018-05-21 DIAGNOSIS — R0781 Pleurodynia: Secondary | ICD-10-CM

## 2018-05-21 DIAGNOSIS — W19XXXA Unspecified fall, initial encounter: Secondary | ICD-10-CM | POA: Diagnosis not present

## 2018-05-21 DIAGNOSIS — F17210 Nicotine dependence, cigarettes, uncomplicated: Secondary | ICD-10-CM | POA: Insufficient documentation

## 2018-05-21 DIAGNOSIS — R52 Pain, unspecified: Secondary | ICD-10-CM | POA: Diagnosis not present

## 2018-05-21 DIAGNOSIS — I10 Essential (primary) hypertension: Secondary | ICD-10-CM | POA: Insufficient documentation

## 2018-05-21 DIAGNOSIS — Z79899 Other long term (current) drug therapy: Secondary | ICD-10-CM | POA: Insufficient documentation

## 2018-05-21 DIAGNOSIS — Y9289 Other specified places as the place of occurrence of the external cause: Secondary | ICD-10-CM | POA: Insufficient documentation

## 2018-05-21 DIAGNOSIS — S299XXA Unspecified injury of thorax, initial encounter: Secondary | ICD-10-CM | POA: Diagnosis present

## 2018-05-21 DIAGNOSIS — Y9389 Activity, other specified: Secondary | ICD-10-CM | POA: Diagnosis not present

## 2018-05-21 DIAGNOSIS — I959 Hypotension, unspecified: Secondary | ICD-10-CM | POA: Diagnosis not present

## 2018-05-21 MED ORDER — DICLOFENAC SODIUM 50 MG PO TBEC: 50.0000 mg | DELAYED_RELEASE_TABLET | Freq: Two times a day (BID) | ORAL | 0 refills | Status: DC

## 2018-05-21 MED ORDER — METHOCARBAMOL 500 MG PO TABS
500.0000 mg | ORAL_TABLET | Freq: Once | ORAL | Status: AC
  Administered 2018-05-21: 500 mg via ORAL
  Filled 2018-05-21: qty 1

## 2018-05-21 MED ORDER — METHOCARBAMOL 500 MG PO TABS: 500.0000 mg | ORAL_TABLET | Freq: Two times a day (BID) | ORAL | 0 refills | Status: DC

## 2018-05-21 MED ORDER — LIDOCAINE 5 % EX PTCH: 1.0000 | MEDICATED_PATCH | CUTANEOUS | 0 refills | Status: DC

## 2018-05-21 MED ORDER — LIDOCAINE 5 % EX PTCH
1.0000 | MEDICATED_PATCH | Freq: Once | CUTANEOUS | Status: DC
  Administered 2018-05-21: 1 via TRANSDERMAL
  Filled 2018-05-21: qty 1

## 2018-05-21 MED ORDER — HYDROCODONE-ACETAMINOPHEN 5-325 MG PO TABS: 1.0000 | ORAL_TABLET | Freq: Every evening | ORAL | 0 refills | Status: DC | PRN

## 2018-05-21 MED ORDER — KETOROLAC TROMETHAMINE 60 MG/2ML IM SOLN
30.0000 mg | Freq: Once | INTRAMUSCULAR | Status: AC
  Administered 2018-05-21: 30 mg via INTRAMUSCULAR
  Filled 2018-05-21: qty 2

## 2018-05-21 MED ORDER — OXYCODONE-ACETAMINOPHEN 5-325 MG PO TABS
1.0000 | ORAL_TABLET | Freq: Once | ORAL | Status: AC
  Administered 2018-05-21: 1 via ORAL
  Filled 2018-05-21: qty 1

## 2018-05-21 NOTE — ED Provider Notes (Signed)
[facility] DEPT Provider Note   CSN: [id number] Arrival date & time: 05/21/18  1415     History   Chief Complaint Chief Complaint  Patient presents with  . Rib cage pain    right    HPI David Curtis is a 57 y.o. male who presents to the ED with right rib pain. Patient was evaluated and treated at [facility] yesterday after he tripped over his dog and fell down a flight of stairs. Patient has been taking flexeril and ibuprofen without relief. Patient raising his voice and stating that he needs something for pain that he has not slept and someone needs to do something for him and if not he will get an ambulance to [facility]. Patient had imaging yesterday and no acute findings noted.   HPI  Past Medical History:  Diagnosis Date  . Arthritis   . COPD (chronic obstructive pulmonary disease) ([place])   . GERD (gastroesophageal reflux disease)   . Hypertension     Patient Active Problem List   Diagnosis Date Noted  . COPD (chronic obstructive pulmonary disease) ([place]) 09/22/2016  . Atherosclerosis of aorta ([place]) 09/22/2016  . History of colonic polyps   . Hemorrhoid   . Substance abuse ([place]) 06/20/2015  . Gastritis 01/09/2015  . Tobacco use disorder 01/09/2015  . Heavy alcohol use 01/09/2015  . Essential hypertension 01/09/2015  . Chronic ankle pain 01/09/2015  . Liver mass 01/02/2015  . Pancreatic mass 01/02/2015  . Rectal bleeding 01/02/2015  . Fatigue 01/02/2015  . Loss of weight 01/02/2015  . Abdominal pain 01/02/2015    Past Surgical History:  Procedure Laterality Date  . APPENDECTOMY  2011  . COLONOSCOPY     Approx 2000 in [place], [place]; infectious colitis, no polyps/masses (per patient; records not available)  . COLONOSCOPY WITH PROPOFOL N/A 08/09/2015   [id number] hemorrhoids  . JOINT REPLACEMENT Right    ankle- has been broken 4x  . POLYPECTOMY N/A 08/09/2015   Procedure: POLYPECTOMY;  Surgeon: Daneil Dolin, MD;  Location: [facility];   Service: Endoscopy;  Laterality: N/A;        Home Medications    Prior to Admission medications   Medication Sig Start Date End Date Taking? Authorizing Provider  acetaminophen (TYLENOL) 500 MG tablet Take 500 mg by mouth every 6 (six) hours as needed for mild pain or moderate pain.    [provider]  albuterol (PROVENTIL HFA;VENTOLIN HFA) 108 (90 Base) MCG/ACT inhaler Inhale 2 puffs into the lungs every 4 (four) hours as needed for wheezing or shortness of breath. 05/05/18   Alycia Rossetti, MD  albuterol (PROVENTIL) (2.5 MG/3ML) 0.083% nebulizer solution Take 3 mLs (2.5 mg total) by nebulization every 6 (six) hours as needed for wheezing or shortness of breath. 02/24/18   Fraser, Modena Nunnery, MD  diclofenac (VOLTAREN) 50 MG EC tablet Take 1 tablet (50 mg total) by mouth 2 (two) times daily. 05/21/18   Ashley Murrain, NP  fluticasone furoate-vilanterol (BREO ELLIPTA) 200-25 MCG/INH AEPB Inhale 1 puff into the lungs daily. 05/05/18   , Modena Nunnery, MD  hydrochlorothiazide (MICROZIDE) 12.5 MG capsule Take 1 capsule (12.5 mg total) by mouth daily. 05/05/18   Alycia Rossetti, MD  HYDROcodone-acetaminophen (NORCO/VICODIN) 5-325 MG tablet Take 1 tablet by mouth at bedtime as needed. 05/21/18   Ashley Murrain, NP  lidocaine (LIDODERM) 5 % Place 1 patch onto the skin daily. Remove & Discard patch within 12 hours or as directed by  MD 05/21/18   Ashley Murrain, NP  lisinopril (PRINIVIL,ZESTRIL) 40 MG tablet Take 1 tablet (40 mg total) by mouth daily. 05/05/18   [place], Modena Nunnery, MD  methocarbamol (ROBAXIN) 500 MG tablet Take 1 tablet (500 mg total) by mouth 2 (two) times daily. 05/21/18   Ashley Murrain, NP  promethazine (PHENERGAN) 25 MG tablet Take 1 tablet (25 mg total) by mouth every 6 (six) hours as needed for nausea or vomiting. 02/22/18   Orlena Sheldon, PA-C  simvastatin (ZOCOR) 20 MG tablet Take 1 tablet (20 mg total) by mouth at bedtime. 05/05/18   Alycia Rossetti, MD    Family  History Family History  Problem Relation Age of Onset  . Arthritis Mother   . Cancer Mother        unknown type  . Diabetes Mother   . Hypertension Mother   . Arthritis Sister   . Cancer Sister        unknown type, possibly breast CA  . COPD Brother   . Arthritis Sister   . Arthritis Sister   . Arthritis Sister   . Colon cancer Neg Hx   . Pancreatic cancer Neg Hx     Social History Social History   Tobacco Use  . Smoking status: Current Every Day Smoker    Packs/day: 3.00    Years: 40.00    Pack years: 120.00    Types: Cigarettes  . Smokeless tobacco: Former Network engineer Use Topics  . Alcohol use: Yes    Alcohol/week: 7.2 oz    Types: 12 Cans of beer per week    Comment: 1/2 pint and six pack a day.  . Drug use: Yes    Types: Marijuana, Cocaine    Comment: 1 month ago, cocaine     Allergies   Patient has no known allergies.   Review of Systems Review of Systems  Musculoskeletal: Positive for arthralgias.       Right rib pain  All other systems reviewed and are negative.    Physical Exam Updated Vital Signs BP 118/76   Pulse 77   Temp 97.8 F (36.6 C) (Oral)   Resp 18   SpO2 100%   Physical Exam  Constitutional: He appears well-developed and well-nourished. No distress.  HENT:  Head: Normocephalic.  Eyes: EOM are normal.  Neck: Normal range of motion. Neck supple.  Cardiovascular: Normal rate.  Pulmonary/Chest: Effort normal.  Musculoskeletal: Normal range of motion.       Back:  Right posterior rib tenderness with palpation.  Neurological: He is alert.  Skin: Skin is warm and dry.  Psychiatric: He has a normal mood and affect.  Nursing note and vitals reviewed.    ED Treatments / Results: Lidocaine patch applied to right rib area and patient immediately states that it is not going to help. Patient states he wants a shot. Patient given Toradol 30 mg IM.   Labs (all labs ordered are listed, but only abnormal results are  displayed) Labs Reviewed - No data to display  Radiology Dg Ribs Unilateral W/chest Right  Result Date: 05/20/2018 CLINICAL DATA:  57 year old male with fall.  Right chest wall pain. EXAM: RIGHT RIBS AND CHEST - 3+ VIEW COMPARISON:  Chest radiograph dated 09/17/2016 and CT dated 07/24/2016 FINDINGS: The lungs are clear. There is no pleural effusion or pneumothorax. Top-normal cardiac size. No acute osseous pathology. IMPRESSION: Negative. Electronically Signed   By: Anner Crete M.D.   On: 05/20/2018 06:58  Dg Thoracic Spine W/swimmers  Result Date: 05/20/2018 CLINICAL DATA:  57 year old male with fall and back pain. EXAM: THORACIC SPINE - 3 VIEWS COMPARISON:  Chest radiograph dated 09/17/2016 and chest CT dated 07/24/2016 FINDINGS: There is no acute fracture or subluxation of the thoracic spine. The soft tissues appear unremarkable. IMPRESSION: No acute/traumatic thoracic spine pathology. Electronically Signed   By: Anner Crete M.D.   On: 05/20/2018 06:57   Dg Lumbar Spine Complete  Result Date: 05/20/2018 CLINICAL DATA:  57 y/o M; patient tripped over dog in [place] fall down 14 steps. Midline spinal tenderness and right rib tenderness. EXAM: LUMBAR SPINE - COMPLETE 4+ VIEW COMPARISON:  None. FINDINGS: There is no evidence of lumbar spine fracture. Alignment is normal. Intervertebral disc spaces are maintained. Small endplate marginal osteophytes. Mild lower lumbar facet arthrosis. IMPRESSION: 1. No acute fracture or malalignment. 2. Minimal degenerative changes of the lumbar spine. Electronically Signed   By: Kristine Garbe M.D.   On: 05/20/2018 06:49   Ct Head Wo Contrast  Result Date: 05/20/2018 CLINICAL DATA:  57 year old male with fall. EXAM: CT HEAD WITHOUT CONTRAST CT CERVICAL SPINE WITHOUT CONTRAST TECHNIQUE: Multidetector CT imaging of the head and cervical spine was performed following the standard protocol without intravenous contrast. Multiplanar CT image  reconstructions of the cervical spine were also generated. COMPARISON:  None. FINDINGS: CT HEAD FINDINGS Brain: The ventricles and sulci appropriate size for patient's age. The gray-white matter discrimination is preserved. There is no acute intracranial hemorrhage. No mass effect or midline shift. No extra-axial fluid collection. Vascular: No hyperdense vessel or unexpected calcification. Skull: Normal. Negative for fracture or focal lesion. Sinuses/Orbits: There is mild mucoperiosteal thickening of paranasal sinuses. No air-fluid level. Mastoid air cells are clear.There is dysconjugate gaze which may represent strabismus. Clinical correlation is recommended. Other: There is slight irregularity of the nasal bridge, likely chronic. Clinical correlation is recommended. Dental caries and left maxillary molar periapical lucency. CT CERVICAL SPINE FINDINGS Alignment: Normal. Skull base and vertebrae: No acute fracture. No primary bone lesion or focal pathologic process. Soft tissues and spinal canal: No prevertebral fluid or swelling. No visible canal hematoma. Disc levels: No acute findings. No significant degenerative changes. Upper chest: Mild emphysema. Other: Bilateral carotid bulb calcified plaques. IMPRESSION: 1. No acute intracranial pathology. 2. No acute/traumatic cervical spine pathology. Electronically Signed   By: Anner Crete M.D.   On: 05/20/2018 06:32   Ct Cervical Spine Wo Contrast  Result Date: 05/20/2018 CLINICAL DATA:  57 year old male with fall. EXAM: CT HEAD WITHOUT CONTRAST CT CERVICAL SPINE WITHOUT CONTRAST TECHNIQUE: Multidetector CT imaging of the head and cervical spine was performed following the standard protocol without intravenous contrast. Multiplanar CT image reconstructions of the cervical spine were also generated. COMPARISON:  None. FINDINGS: CT HEAD FINDINGS Brain: The ventricles and sulci appropriate size for patient's age. The gray-white matter discrimination is preserved.  There is no acute intracranial hemorrhage. No mass effect or midline shift. No extra-axial fluid collection. Vascular: No hyperdense vessel or unexpected calcification. Skull: Normal. Negative for fracture or focal lesion. Sinuses/Orbits: There is mild mucoperiosteal thickening of paranasal sinuses. No air-fluid level. Mastoid air cells are clear.There is dysconjugate gaze which may represent strabismus. Clinical correlation is recommended. Other: There is slight irregularity of the nasal bridge, likely chronic. Clinical correlation is recommended. Dental caries and left maxillary molar periapical lucency. CT CERVICAL SPINE FINDINGS Alignment: Normal. Skull base and vertebrae: No acute fracture. No primary bone lesion or focal pathologic process. Soft tissues  and spinal canal: No prevertebral fluid or swelling. No visible canal hematoma. Disc levels: No acute findings. No significant degenerative changes. Upper chest: Mild emphysema. Other: Bilateral carotid bulb calcified plaques. IMPRESSION: 1. No acute intracranial pathology. 2. No acute/traumatic cervical spine pathology. Electronically Signed   By: Anner Crete M.D.   On: 05/20/2018 06:32    Procedures Procedures (including critical care time)  Medications Ordered in ED Medications  lidocaine (LIDODERM) 5 % 1 patch (1 patch Transdermal Patch Applied 05/21/18 1456)  ketorolac (TORADOL) injection 30 mg (30 mg Intramuscular Given 05/21/18 1517)  oxyCODONE-acetaminophen (PERCOCET/ROXICET) 5-325 MG per tablet 1 tablet (1 tablet Oral Given 05/21/18 1555)  methocarbamol (ROBAXIN) tablet 500 mg (500 mg Oral Given 05/21/18 1555)     Initial Impression / Assessment and Plan / ED Course  I have reviewed the triage vital signs and the nursing notes. 57 y.o. male with second visit for pain s/p fall yesterday stable for d/c. Pain controlled in the ED. Patient request that his muscle relaxer and pain medication be changed. Patient to f/u with his PCP on  Monday. Will give 3 hydrocodone for patient to use at night if he has trouble sleeping until he can see his PCP on Monday.  Final Clinical Impressions(s) / ED Diagnoses   Final diagnoses:  Rib pain on right side    ED Discharge Orders        Ordered    lidocaine (LIDODERM) 5 %  Every 24 hours     05/21/18 1632    diclofenac (VOLTAREN) 50 MG EC tablet  2 times daily     05/21/18 1632    methocarbamol (ROBAXIN) 500 MG tablet  2 times daily     05/21/18 1632    HYDROcodone-acetaminophen (NORCO/VICODIN) 5-325 MG tablet  At bedtime PRN     05/21/18 1632       Debroah Baller [place], NP 05/21/18 1638    Dorie Rank, MD 05/22/18 1630

## 2018-05-21 NOTE — ED Triage Notes (Signed)
Per EMS, pt seen yesterday for fall and has pain in right torso. Pt states pain was worse today. Pt was prescribed ibuprofen but states it didn't help.  BP 154/86 HR 92 RR 16 O2 99

## 2018-05-21 NOTE — Discharge Instructions (Addendum)
Follow up with your doctor as scheduled for Monday. Do not drive while taking the medications because they will make you sleepy.

## 2018-05-24 ENCOUNTER — Encounter: Payer: Self-pay | Admitting: Physician Assistant

## 2018-05-24 ENCOUNTER — Ambulatory Visit (INDEPENDENT_AMBULATORY_CARE_PROVIDER_SITE_OTHER): Payer: Medicare Other | Admitting: Physician Assistant

## 2018-05-24 ENCOUNTER — Encounter: Payer: Self-pay | Admitting: Family Medicine

## 2018-05-24 VITALS — BP 130/78 | HR 81 | Temp 98.3°F | Resp 18 | Ht 68.0 in | Wt 180.6 lb

## 2018-05-24 DIAGNOSIS — M546 Pain in thoracic spine: Secondary | ICD-10-CM | POA: Diagnosis not present

## 2018-05-24 MED ORDER — HYDROCODONE-ACETAMINOPHEN 5-325 MG PO TABS: 1.0000 | ORAL_TABLET | Freq: Four times a day (QID) | ORAL | 0 refills | Status: DC | PRN

## 2018-05-24 NOTE — Progress Notes (Signed)
Patient ID: Ikenna Ohms MRN: [id number], DOB: 10/03/1961, 57 y.o. Date of Encounter: 05/24/2018, 12:50 PM    Chief Complaint:  Chief Complaint  Patient presents with  . Back Pain    fell      HPI: 57 y.o. year old male presents with above.   I have reviewed his ER note from 05/20/2018 and 05/21/2018.  He presented to the emergency department for evaluation after a fall.  Ported that he tripped over the dog and fell down a flight of stairs.  It occurred 6 hours prior to his visit on 05/20/2018.  At the time of the ER visit was complaining of right posterior rib pain.  Pain was sharp and severe.  At that it worsened when he would move and felt better if he was lying on the right side and applied pressure to the ribs on the right.  ER performed :     ribs x-ray.  Thoracic spine x-ray.  Bar spine x-ray.  CT head.  CT cervical spine. Imaging revealed no acute abnormalities. At the time of the initial ER visit 05/20/2018--- treated with Zofran 4 mg injection and morphine for milligram IV.  Discharged with ibuprofen 800 mg and cyclobenzaprine 10 mg.  There is then a another ER visit dated 05/21/2018.  Note reviewed that he had been treated at the ER the prior day after tripping over the dog and falling down flight of stairs.  Noted that he has been taking Flexeril and ibuprofen without relief.  That second ER visit on June 28 he reported that he needed something for pain that he had not slept.  At that visit they ordered lidocaine patch Toradol injection oxycodone 5 mg tablet and Robaxin 500 mg tablet.  They sent him home with lidocaine patch diclofenac methocarbamol and hydrocodone 5/325 --#3.  Today he tells me that on the date of June 28 --- " I was supposed to be seeing you that afternoon at 4:00 but that day I was on the toilet and I could not get up and had to call 911 because I was on the toilet and could not get up it hurts so bad" states that he has to sit up or else he has significant pain  in the right back.  Says that he cannot sleep.  Says that he certainly cannot think about rolling over in the bed etc. because it hurts to even sit up.  Says that even when he went back to the ER they gave him a shot of some medicine that helped while he was there but once that was over "pain was right back to where it was before ".  Asked what medications he is using and he says "800 mg ibuprofen and muscle relaxer but they are no good ". Asked what medicines he is been taking and he hands me his medicine back and says that what he has been using is all in there. That medicine back includes his inhaled medicines and his blood pressure pills etc.  Other meds that pertaining to this include: Empty bottle of hydrocodone 5/325 that was prescribed for #3 Diclofenac No. 15 Methocarbamol No. 20 Cyclobenzaprine No. 20 Ibuprofen No. 21  Points to right lateral back as area of pain.  No other concerns today. Says that " even when he is sitting up and being still he still feels pain in that area of his right back. Says that "if he coughs, it feels like Lorine Bears punching him right there  over and over."  Asked if he usually works a job.  Did not know if being out of work was an issue whether he needed any note for work.  States that he does not work other than usually doing work around [employer].    Home Meds:   Outpatient Medications Prior to Visit  Medication Sig Dispense Refill  . acetaminophen (TYLENOL) 500 MG tablet Take 500 mg by mouth every 6 (six) hours as needed for mild pain or moderate pain.    Marland Kitchen albuterol (PROVENTIL HFA;VENTOLIN HFA) 108 (90 Base) MCG/ACT inhaler Inhale 2 puffs into the lungs every 4 (four) hours as needed for wheezing or shortness of breath. 3 Inhaler 3  . albuterol (PROVENTIL) (2.5 MG/3ML) 0.083% nebulizer solution Take 3 mLs (2.5 mg total) by nebulization every 6 (six) hours as needed for wheezing or shortness of breath. 150 mL 0  . diclofenac (VOLTAREN) 50 MG EC  tablet Take 1 tablet (50 mg total) by mouth 2 (two) times daily. 15 tablet 0  . fluticasone furoate-vilanterol (BREO ELLIPTA) 200-25 MCG/INH AEPB Inhale 1 puff into the lungs daily. 90 each 3  . hydrochlorothiazide (MICROZIDE) 12.5 MG capsule Take 1 capsule (12.5 mg total) by mouth daily. 90 capsule 1  . lidocaine (LIDODERM) 5 % Place 1 patch onto the skin daily. Remove & Discard patch within 12 hours or as directed by MD 10 patch 0  . lisinopril (PRINIVIL,ZESTRIL) 40 MG tablet Take 1 tablet (40 mg total) by mouth daily. 90 tablet 1  . methocarbamol (ROBAXIN) 500 MG tablet Take 1 tablet (500 mg total) by mouth 2 (two) times daily. 20 tablet 0  . promethazine (PHENERGAN) 25 MG tablet Take 1 tablet (25 mg total) by mouth every 6 (six) hours as needed for nausea or vomiting. 30 tablet 0  . simvastatin (ZOCOR) 20 MG tablet Take 1 tablet (20 mg total) by mouth at bedtime. 90 tablet 3  . HYDROcodone-acetaminophen (NORCO/VICODIN) 5-325 MG tablet Take 1 tablet by mouth at bedtime as needed. 3 tablet 0   No facility-administered medications prior to visit.     Allergies: No Known Allergies    Review of Systems: See HPI for pertinent ROS. All other ROS negative.    Physical Exam: Blood pressure 130/78, pulse 81, temperature 98.3 F (36.8 C), temperature source Oral, resp. rate 18, height 5\' 8"  (1.727 m), weight 81.9 kg (180 lb 9.6 oz), SpO2 98 %., Body mass index is 27.46 kg/m. General:  WNWD AAM. Appears in no acute distress. Neck: Supple. No thyromegaly. No lymphadenopathy. Lungs: Clear bilaterally to auscultation without wheezes, rales, or rhonchi. Breathing is unlabored. Heart: Regular rhythm. No murmurs, rubs, or gallops. Msk:  Strength and tone normal for age. Points to right lateral back at approximate T7 level is area of pain. Extremities/Skin: Warm and dry.  Neuro: Alert and oriented X 3. Moves all extremities spontaneously. Gait is normal. CNII-XII grossly in tact. Psych:  Responds to  questions appropriately with a normal affect.     ASSESSMENT AND PLAN:  57 y.o. year old male with   1. Acute right-sided thoracic back pain He already has 2 types of muscle relaxers both cyclobenzaprine and methocarbamol. Already has 2 types of anti-inflammatories diclofenac and ibuprofen. Discussed using 1 of the muscle relaxers and 1 of the anti-inflammatories at a time. I explained that given the opioid crisis DEA has cracked down on [place] minimizing use of stronger pain pills. I will send in #15 of hydrocodone No. 5's for  him to use 1 every 6 hours as needed for more severe pain. continue to rest the back as much as possible.  Recommend sitting with heating pad for some relief.   [address] [place], [place], [facility] 05/24/2018 12:50 PM

## 2018-05-25 ENCOUNTER — Other Ambulatory Visit: Payer: Self-pay

## 2018-05-25 MED ORDER — HYDROCODONE-ACETAMINOPHEN 5-325 MG PO TABS: 1.0000 | ORAL_TABLET | Freq: Four times a day (QID) | ORAL | 0 refills | Status: DC | PRN

## 2018-05-25 NOTE — Telephone Encounter (Signed)
Patient was in office on 05/24/2018 to see Olean Ree she refilled rx however rx printed. Can you please send this in to patient pharmacy?

## 2018-06-01 ENCOUNTER — Other Ambulatory Visit: Payer: Self-pay | Admitting: *Deleted

## 2018-06-01 NOTE — Telephone Encounter (Signed)
Received call from patient.   Requested refill on Hydrocodone/APAP. Medication was refilled on 05/25/2018.  VM full at this time.

## 2018-06-04 ENCOUNTER — Telehealth: Payer: Self-pay | Admitting: *Deleted

## 2018-06-04 NOTE — Telephone Encounter (Signed)
He will need to be seen again for pain medications Due to discrepancy at the pharmacy, per there database his friend picked up the medication

## 2018-06-04 NOTE — Telephone Encounter (Signed)
Received call from patient. Requested refill on Hydrocodone/APAP. Advised that prescription was sent to [facility] on 05/25/2018. Pharmacy confirmed receipt of refill on 05/25/2018 at 12:14pm. Patient states that he was not notified prescription was ready at pharmacy. Advised to contact pharmacy.   Received return call from patient. States that pharmacy reports that no prescription is ready. [place] office staff pulled [facility] narcotic report and noted that fill date on last prescription for Hydrocodone/APAP listed as 05/25/2018. Advised patient to contact pharmacy. If patient has not picked up prescription, it may have been "put back" or it is possible another person picked up prescription. Advised if medication was picked up by another, a signature would have been required to pick up. Advised if signature does not match any authorized person to pick up his medication, he should contact local police.   Call placed to [facility]. Advised that prescription was picked up and they are loading the system that will show the signature/ ID of person who picked up medication. Will call office once they have details. PCP to be made aware.

## 2018-06-04 NOTE — Telephone Encounter (Signed)
Call placed to patient.   States that Welsh found medication in car. Apologized for inconvenience.

## 2018-06-04 NOTE — Telephone Encounter (Signed)
Received return call from [facility]. Prescription was picked up on 05/25/2018 by Cristal Deer [phone] telephone, patient's significant other.   Patient states that they did get prescription filled that day, but it wasn't the pain medication.

## 2018-06-11 ENCOUNTER — Ambulatory Visit: Payer: Medicare Other | Admitting: Family Medicine

## 2018-07-27 IMAGING — CT CT CHEST W/O CM
2 of 4 series · 15 of 46 positions shown, 17 images · non-contrast
Comparison: No prior chest CT. Chest x-ray 07/09/2016 and
07/11/2016.

CLINICAL DATA: 55-year-old male with prior abnormal chest x-ray.
Evaluate for underlying pulmonary nodules.

EXAM:
CT CHEST WITHOUT CONTRAST
TECHNIQUE: Multidetector CT imaging of the chest was performed following the
standard protocol without IV contrast.

[Series 2: routine chest wo without · axial · non-contrast · 0.65mm/px · z∈[+1095,+1355]mm · 12 of 151 slices shown, 14 images]
[im 11/151  soft-tissue]
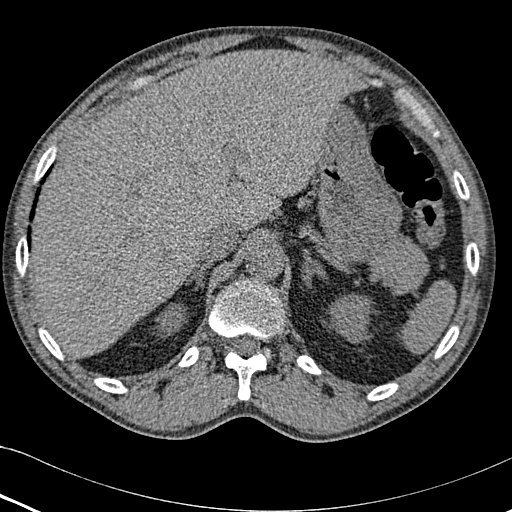
[im 11/151  bone]
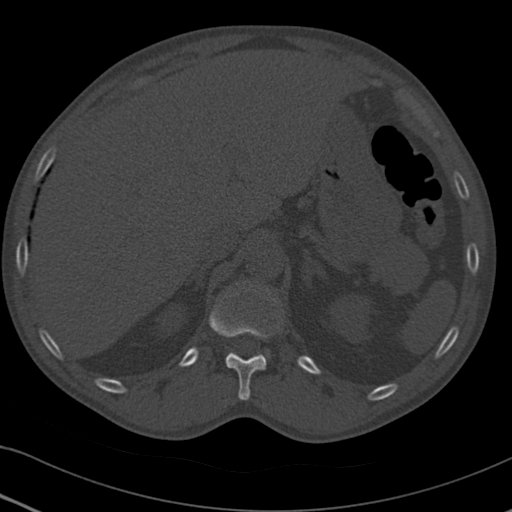
[im 21/151  soft-tissue]
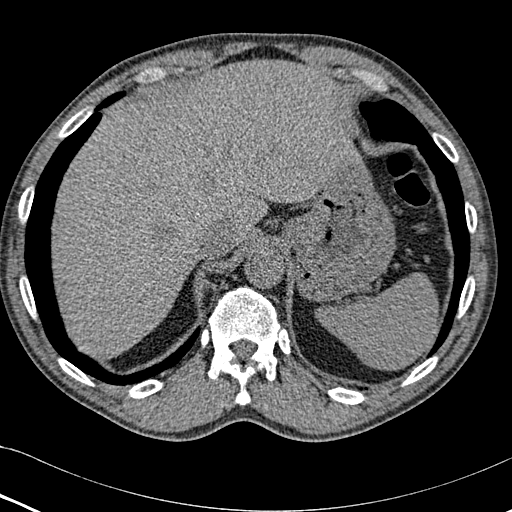
[im 31/151  soft-tissue]
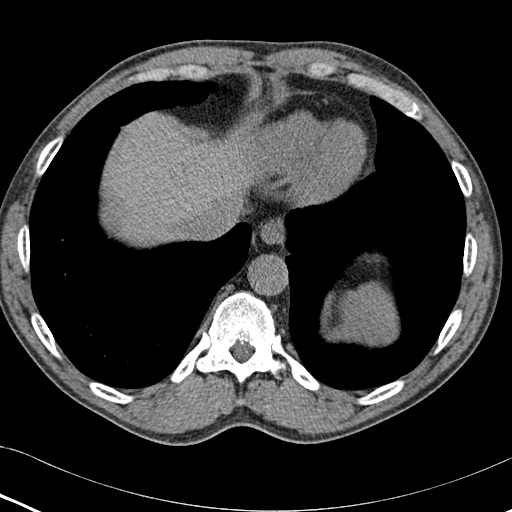
[im 51/151  soft-tissue]
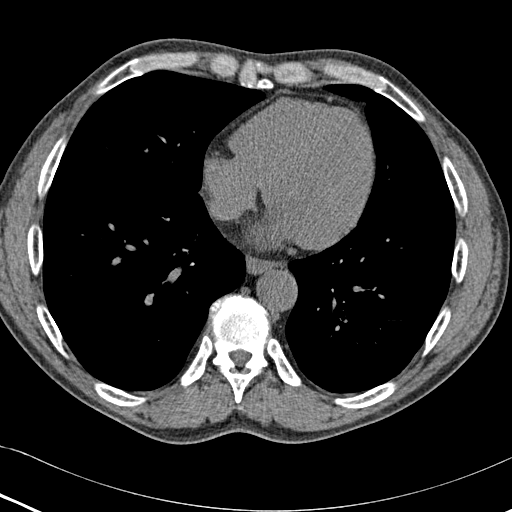
[im 61/151  soft-tissue]
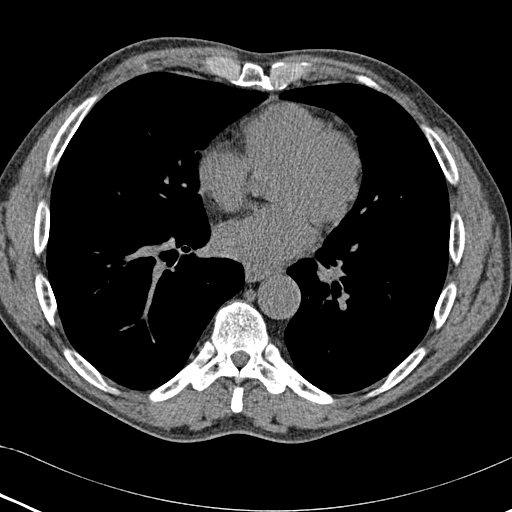
[im 71/151  soft-tissue]
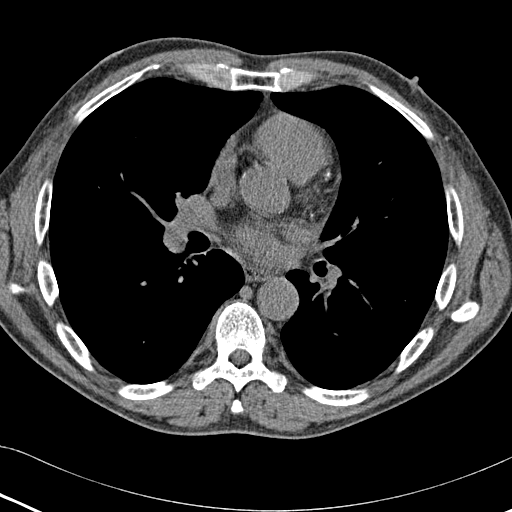
[im 81/151  soft-tissue]
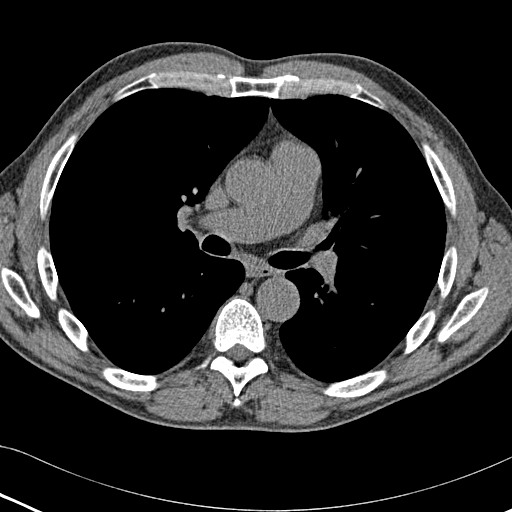
[im 91/151  soft-tissue]
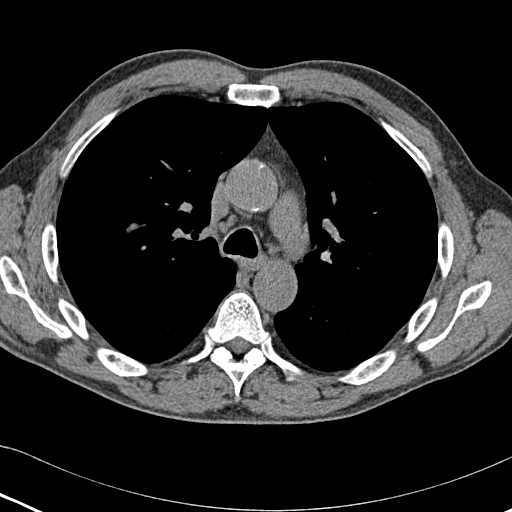
[im 101/151  soft-tissue]
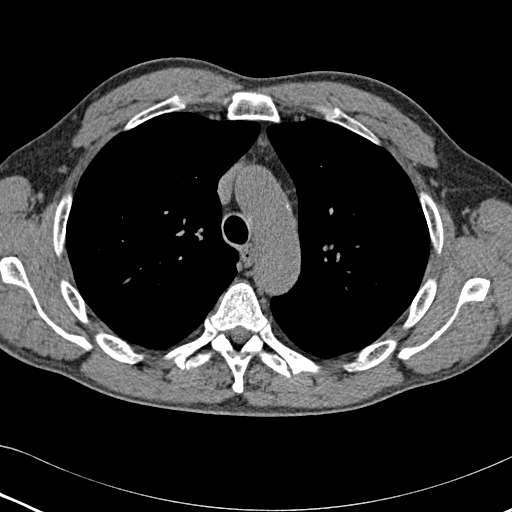
[im 101/151  bone]
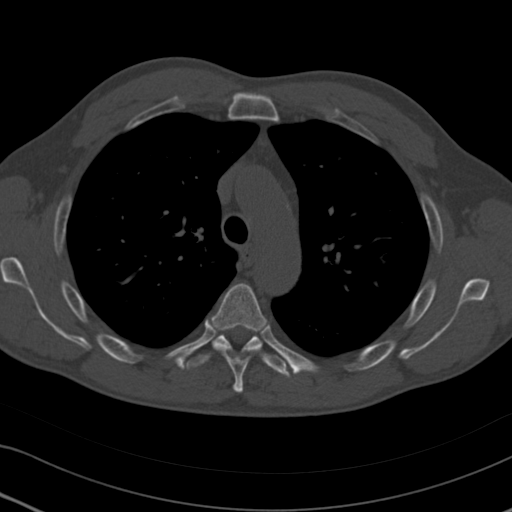
[im 121/151  soft-tissue]
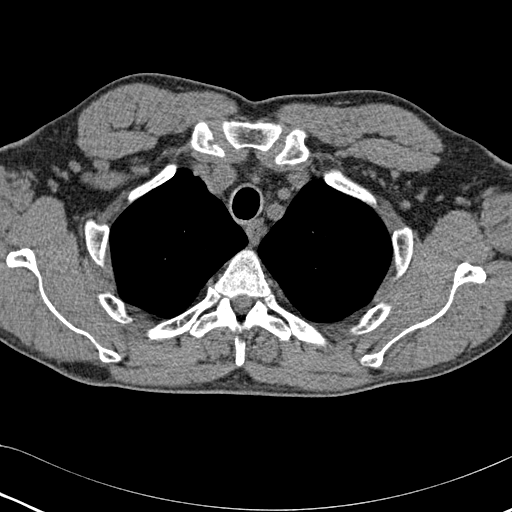
[im 131/151  soft-tissue]
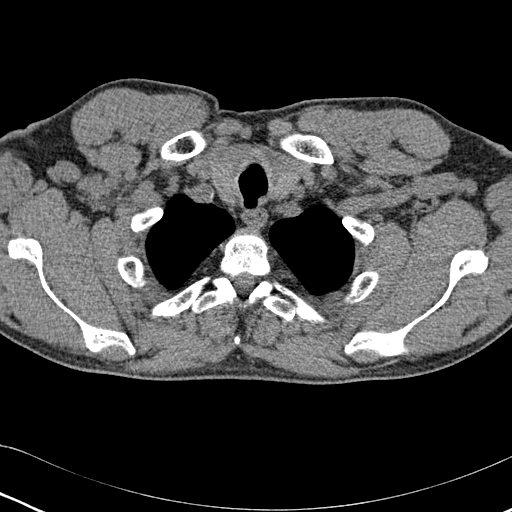
[im 141/151  soft-tissue]
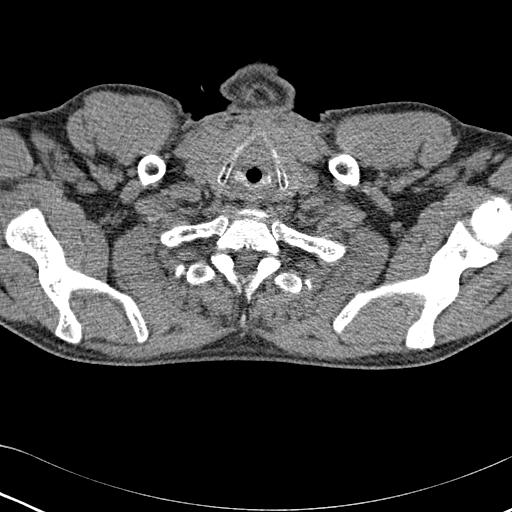

[Series 4: coronal · coronal · 0.63mm/px · 3 of 141 slices shown]
[im 47/141  soft-tissue]
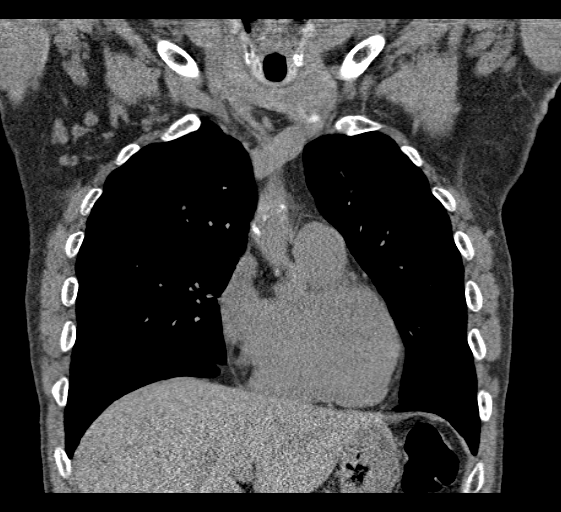
[im 63/141  soft-tissue]
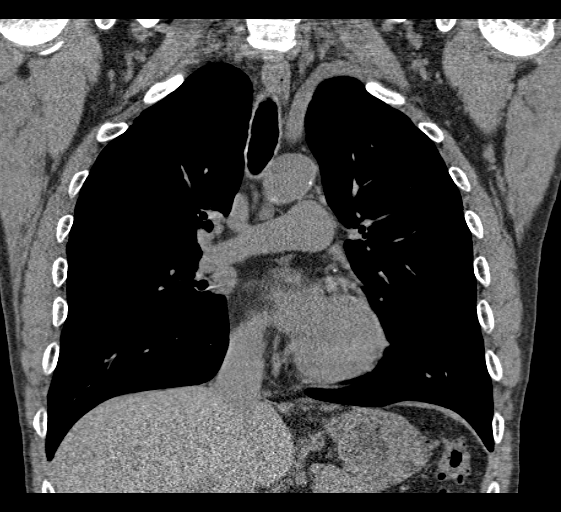
[im 78/141  soft-tissue]
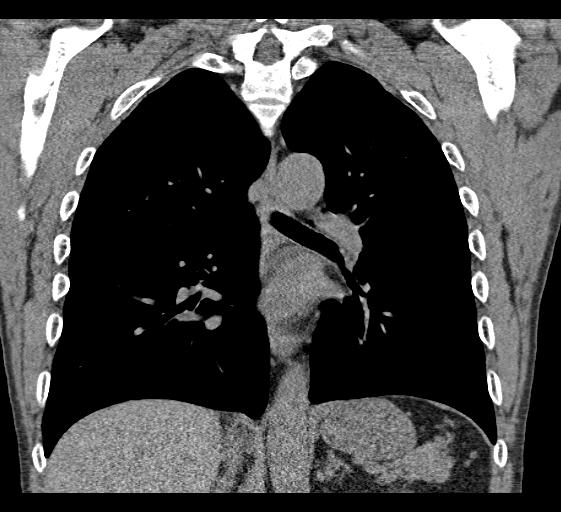

[15 of 46 positions shown; findings below may reference images not displayed]

FINDINGS: Cardiovascular: Heart size is normal. There is no significant
pericardial fluid, thickening or pericardial calcification. There is
aortic atherosclerosis, as well as atherosclerosis of the great
vessels of the mediastinum and the coronary arteries, including
calcified atherosclerotic plaque in the left main, left anterior
descending, left circumflex and right coronary arteries.

Mediastinum/Nodes: No pathologically enlarged mediastinal or hilar
lymph nodes. Please note that accurate exclusion of hilar adenopathy
is limited on noncontrast CT scans. Esophagus is unremarkable in
appearance. No axillary lymphadenopathy.

Lungs/Pleura: Diffuse centrilobular ground-glass attenuation
micronodularity is noted, most compatible with a smoking related
disease. Some of these nodules appear partially cavitary with
thickened walls (example in the right upper lobe on image 55 of
series 3). A few scattered more solid appearing pulmonary nodules
are noted, largest of which is in the medial aspect of the anterior
right upper lobe (image 63 of series 3) measuring 6 x 4 mm (mean
diameter 5 mm). No larger more suspicious appearing pulmonary
nodules or masses are identified. No acute consolidative airspace
disease. No pleural effusions.

Upper Abdomen: Unremarkable.

Musculoskeletal: There are no aggressive appearing lytic or blastic
lesions noted in the visualized portions of the skeleton.
IMPRESSION: 1. No large suspicious appearing pulmonary nodules are noted to
account for the perceives opacities on the recent chest radiograph.
2. However, there is a spectrum of findings in the lungs suggestive
of a smoking related disease such as respiratory bronchiolitis (CHRYSLER),
or more likely given the patient's symptomatology, respiratory
bronchiolitis interstitial lung disease (EPELBAUM). Importantly, there
are a few of these small nodules in the lungs which appear partially
cavitary, which could indicate a more aggressive form of smoking
related disease; specifically, findings are concerning for pulmonary
Langerhans' Cell Histiocytosis. Counseling for smoking cessation is
strongly recommended.
3. Although the multiple tiny pulmonary nodules are strongly favored
to be reflective of a smoking related disease, they are nonspecific.
Non-contrast chest CT is suggested in 12 months given that the
patient is high-risk. Preferably, this should be performed as a
high-resolution chest CT (HRCT). This recommendation follows the
consensus statement: Guidelines for Management of Incidental
Pulmonary Nodules Detected on CT Images:From the [HOSPITAL]
0919; published online before print (10.1148/radiol.0611151546).
4. Mild diffuse bronchial thickening with mild centrilobular and
paraseptal emphysema.
5. Aortic atherosclerosis, in addition to left main and 3 vessel
coronary artery disease. Please note that although the presence of
coronary artery calcium documents the presence of coronary artery
disease, the severity of this disease and any potential stenosis
cannot be assessed on this non-gated CT examination. Assessment for
potential risk factor modification, dietary therapy or pharmacologic
therapy may be warranted, if clinically indicated.

## 2018-09-08 ENCOUNTER — Ambulatory Visit: Payer: Medicare Other | Admitting: Physician Assistant

## 2018-09-15 ENCOUNTER — Other Ambulatory Visit: Payer: Self-pay

## 2018-09-15 NOTE — Telephone Encounter (Signed)
Call placed to pharmacy and spoke with Ramesh with [facility].He informed me patient had refills on file.When patient came to the office today he states he was out of his medications.  Patient also complained of chest congestion, cough, body aches and abdominal pain. Patient was advised we could schedule him an appointment for tomorrow. Patient stated he would take that appointment.

## 2018-09-16 ENCOUNTER — Encounter (HOSPITAL_COMMUNITY): Payer: Self-pay | Admitting: Emergency Medicine

## 2018-09-16 ENCOUNTER — Ambulatory Visit: Payer: Self-pay | Admitting: Family Medicine

## 2018-09-16 ENCOUNTER — Emergency Department (HOSPITAL_COMMUNITY): Payer: Medicare Other

## 2018-09-16 ENCOUNTER — Emergency Department (HOSPITAL_COMMUNITY)
Admission: EM | Admit: 2018-09-16 | Discharge: 2018-09-16 | Disposition: A | Discharge status: Home / Self Care | Payer: Medicare Other | Attending: Emergency Medicine | Admitting: Emergency Medicine

## 2018-09-16 ENCOUNTER — Other Ambulatory Visit: Payer: Self-pay

## 2018-09-16 DIAGNOSIS — J441 Chronic obstructive pulmonary disease with (acute) exacerbation: Secondary | ICD-10-CM | POA: Diagnosis not present

## 2018-09-16 DIAGNOSIS — R05 Cough: Secondary | ICD-10-CM | POA: Diagnosis not present

## 2018-09-16 DIAGNOSIS — F17210 Nicotine dependence, cigarettes, uncomplicated: Secondary | ICD-10-CM | POA: Diagnosis not present

## 2018-09-16 DIAGNOSIS — Z79899 Other long term (current) drug therapy: Secondary | ICD-10-CM | POA: Insufficient documentation

## 2018-09-16 DIAGNOSIS — I10 Essential (primary) hypertension: Secondary | ICD-10-CM | POA: Insufficient documentation

## 2018-09-16 DIAGNOSIS — R0602 Shortness of breath: Secondary | ICD-10-CM | POA: Diagnosis not present

## 2018-09-16 MED ORDER — PREDNISONE 20 MG PO TABS
60.0000 mg | ORAL_TABLET | Freq: Once | ORAL | Status: AC
  Administered 2018-09-16: 60 mg via ORAL
  Filled 2018-09-16: qty 3

## 2018-09-16 MED ORDER — ALBUTEROL SULFATE (2.5 MG/3ML) 0.083% IN NEBU
5.0000 mg | INHALATION_SOLUTION | Freq: Once | RESPIRATORY_TRACT | Status: AC
  Administered 2018-09-16: 5 mg via RESPIRATORY_TRACT
  Filled 2018-09-16: qty 6

## 2018-09-16 MED ORDER — FLUTICASONE FUROATE-VILANTEROL 200-25 MCG/INH IN AEPB: 1.0000 | INHALATION_SPRAY | Freq: Every day | RESPIRATORY_TRACT | 0 refills | Status: AC

## 2018-09-16 MED ORDER — DOXYCYCLINE HYCLATE 100 MG PO CAPS: 100.0000 mg | ORAL_CAPSULE | Freq: Two times a day (BID) | ORAL | 0 refills | Status: DC

## 2018-09-16 MED ORDER — PREDNISONE 20 MG PO TABS: 40.0000 mg | ORAL_TABLET | Freq: Every day | ORAL | 0 refills | Status: DC

## 2018-09-16 MED ORDER — IPRATROPIUM-ALBUTEROL 0.5-2.5 (3) MG/3ML IN SOLN
3.0000 mL | Freq: Once | RESPIRATORY_TRACT | Status: AC
  Administered 2018-09-16: 3 mL via RESPIRATORY_TRACT
  Filled 2018-09-16: qty 3

## 2018-09-16 NOTE — ED Notes (Signed)
Pt verbalized understanding of discharge paperwork and prescriptions.  °

## 2018-09-16 NOTE — ED Triage Notes (Signed)
Pt presents with cough, congestion and shortness of breat x3 days. Reports using his albuterol inhaler.

## 2018-09-16 NOTE — ED Notes (Signed)
Pt ambulated in hallway. O2 sats remained above 97% on RA.

## 2018-09-16 NOTE — Discharge Instructions (Addendum)
It was my pleasure taking care of you today!   Take prednisone daily starting tomorrow morning. You received your first dose in the ER today.  I have given you a refill of your home Breo inhaler. Continue inhaler regimen. Please take all of your antibiotics until finished!   Follow up with your primary care provider for further discussion of today's ER visit.   If you develop any new or worsening symptoms, including but not limited to fever, persistent vomiting, worsening shortness of breath or other symptoms that concern you, please return to the Emergency Department immediately.

## 2018-09-16 NOTE — ED Provider Notes (Signed)
[facility] EMERGENCY DEPARTMENT Provider Note   CSN: [id number] Arrival date & time: 09/16/18  0915     History   Chief Complaint Chief Complaint  Patient presents with  . Cough  . Shortness of Breath    HPI David Curtis is a 57 y.o. male.  The history is provided by medical records and the patient. No language interpreter was used.    David Curtis is a 57 y.o. male  with a PMH of COPD who presents to the Emergency Department complaining of productive cough x 4 days. Worsening. He has tried Dayquil, nyquil, teas and home inhalers without improvement. He has also quit smoking for the last 4 days due to his symptoms.  Associated with shortness of breath.  Denies any chest pain.  No fever, chills.  No abdominal pain, nausea or vomiting.  Past Medical History:  Diagnosis Date  . Arthritis   . COPD (chronic obstructive pulmonary disease) ([place])   . GERD (gastroesophageal reflux disease)   . Hypertension     Patient Active Problem List   Diagnosis Date Noted  . COPD (chronic obstructive pulmonary disease) ([place]) 09/22/2016  . Atherosclerosis of aorta ([place]) 09/22/2016  . History of colonic polyps   . Hemorrhoid   . Substance abuse ([place]) 06/20/2015  . Gastritis 01/09/2015  . Tobacco use disorder 01/09/2015  . Heavy alcohol use 01/09/2015  . Essential hypertension 01/09/2015  . Chronic ankle pain 01/09/2015  . Liver mass 01/02/2015  . Pancreatic mass 01/02/2015  . Rectal bleeding 01/02/2015  . Fatigue 01/02/2015  . Loss of weight 01/02/2015  . Abdominal pain 01/02/2015    Past Surgical History:  Procedure Laterality Date  . APPENDECTOMY  2011  . COLONOSCOPY     Approx 2000 in [place], [place]; infectious colitis, no polyps/masses (per patient; records not available)  . COLONOSCOPY WITH PROPOFOL N/A 08/09/2015   TDD:UKGURKYH hemorrhoids  . JOINT REPLACEMENT Right    ankle- has been broken 4x  . POLYPECTOMY N/A 08/09/2015   Procedure: POLYPECTOMY;   Surgeon: Daneil Dolin, MD;  Location: [facility];  Service: Endoscopy;  Laterality: N/A;        Home Medications    Prior to Admission medications   Medication Sig Start Date End Date Taking? Authorizing Provider  acetaminophen (TYLENOL) 500 MG tablet Take 500 mg by mouth every 6 (six) hours as needed for mild pain or moderate pain.    [provider]  albuterol (PROVENTIL HFA;VENTOLIN HFA) 108 (90 Base) MCG/ACT inhaler Inhale 2 puffs into the lungs every 4 (four) hours as needed for wheezing or shortness of breath. 05/05/18   Alycia Rossetti, MD  albuterol (PROVENTIL) (2.5 MG/3ML) 0.083% nebulizer solution Take 3 mLs (2.5 mg total) by nebulization every 6 (six) hours as needed for wheezing or shortness of breath. 02/24/18   [place], Modena Nunnery, MD  diclofenac (VOLTAREN) 50 MG EC tablet Take 1 tablet (50 mg total) by mouth 2 (two) times daily. 05/21/18   Ashley Murrain, NP  doxycycline (VIBRAMYCIN) 100 MG capsule Take 1 capsule (100 mg total) by mouth 2 (two) times daily. 09/16/18   Makila Colombe, Ozella Almond, PA-C  fluticasone furoate-vilanterol (BREO ELLIPTA) 200-25 MCG/INH AEPB Inhale 1 puff into the lungs daily. 09/16/18   Kristen [place], Ozella Almond, PA-C  hydrochlorothiazide (MICROZIDE) 12.5 MG capsule Take 1 capsule (12.5 mg total) by mouth daily. 05/05/18   [place], Modena Nunnery, MD  HYDROcodone-acetaminophen (NORCO/VICODIN) 5-325 MG tablet Take 1 tablet by mouth every 6 (six)  hours as needed. 05/25/18   [place], Modena Nunnery, MD  lidocaine (LIDODERM) 5 % Place 1 patch onto the skin daily. Remove & Discard patch within 12 hours or as directed by MD 05/21/18   Ashley Murrain, NP  lisinopril (PRINIVIL,ZESTRIL) 40 MG tablet Take 1 tablet (40 mg total) by mouth daily. 05/05/18   [place], Modena Nunnery, MD  methocarbamol (ROBAXIN) 500 MG tablet Take 1 tablet (500 mg total) by mouth 2 (two) times daily. 05/21/18   Ashley Murrain, NP  predniSONE (DELTASONE) 20 MG tablet Take 2 tablets (40 mg total) by mouth daily. 09/16/18    Jacquelynn Friend, Ozella Almond, PA-C  promethazine (PHENERGAN) 25 MG tablet Take 1 tablet (25 mg total) by mouth every 6 (six) hours as needed for nausea or vomiting. 02/22/18   Orlena Sheldon, PA-C  simvastatin (ZOCOR) 20 MG tablet Take 1 tablet (20 mg total) by mouth at bedtime. 05/05/18   Alycia Rossetti, MD    Family History Family History  Problem Relation Age of Onset  . Arthritis Mother   . Cancer Mother        unknown type  . Diabetes Mother   . Hypertension Mother   . Arthritis Sister   . Cancer Sister        unknown type, possibly breast CA  . COPD Brother   . Arthritis Sister   . Arthritis Sister   . Arthritis Sister   . Colon cancer Neg Hx   . Pancreatic cancer Neg Hx     Social History Social History   Tobacco Use  . Smoking status: Current Every Day Smoker    Packs/day: 3.00    Years: 40.00    Pack years: 120.00    Types: Cigarettes  . Smokeless tobacco: Former Network engineer Use Topics  . Alcohol use: Yes    Alcohol/week: 12.0 standard drinks    Types: 12 Cans of beer per week    Comment: 1/2 pint and six pack a day.  . Drug use: Yes    Types: Marijuana, Cocaine    Comment: 1 month ago, cocaine     Allergies   Patient has no known allergies.   Review of Systems Review of Systems  Constitutional: Negative for fever.  HENT: Positive for congestion.   Respiratory: Positive for cough and shortness of breath.   Cardiovascular: Negative for chest pain.  All other systems reviewed and are negative.    Physical Exam Updated Vital Signs BP (!) 144/82   Pulse 65   Temp 97.7 F (36.5 C) (Oral)   Resp 17   Ht 5\' 8"  (1.727 m)   Wt 82.6 kg   SpO2 97%   BMI 27.67 kg/m   Physical Exam  Constitutional: He is oriented to person, place, and time. He appears well-developed and well-nourished. No distress.  Nontoxic-appearing.  Speaking in full sentences without difficulty.  HENT:  Head: Normocephalic and atraumatic.  Neck: Neck supple. No JVD present.    Cardiovascular: Normal rate, regular rhythm and normal heart sounds.  No murmur heard. Pulmonary/Chest: Effort normal. No respiratory distress.  Bilateral expiratory wheezing.  Abdominal: Soft. He exhibits no distension. There is no tenderness.  Musculoskeletal: He exhibits no edema.  Neurological: He is alert and oriented to person, place, and time.  Skin: Skin is warm and dry.  Nursing note and vitals reviewed.    ED Treatments / Results  Labs (all labs ordered are listed, but only abnormal results are displayed) Labs Reviewed -  No data to display  EKG None  Radiology Dg Chest 2 View  Result Date: 09/16/2018 CLINICAL DATA:  Cough congestion shortness of breath for 1 week. EXAM: CHEST - 2 VIEW COMPARISON:  09/17/2016 FINDINGS: Cardiomediastinal silhouette is normal. Mediastinal contours appear intact. There is no evidence of focal airspace consolidation, pleural effusion or pneumothorax. Subchondral lucency in the left humeral head within sclerotic border. Soft tissues are grossly normal. IMPRESSION: No active cardiopulmonary disease. Subchondral lucency in the left humeral head within sclerotic border may represent a subchondral cyst due to osteoarthritic changes or inflammatory arthropathy. Electronically Signed   By: Fidela Salisbury M.D.   On: 09/16/2018 10:32    Procedures Procedures (including critical care time)  Medications Ordered in ED Medications  albuterol (PROVENTIL) (2.5 MG/3ML) 0.083% nebulizer solution 5 mg (5 mg Nebulization Given 09/16/18 0944)  predniSONE (DELTASONE) tablet 60 mg (60 mg Oral Given 09/16/18 1322)  ipratropium-albuterol (DUONEB) 0.5-2.5 (3) MG/3ML nebulizer solution 3 mL (3 mLs Nebulization Given 09/16/18 1343)     Initial Impression / Assessment and Plan / ED Course  I have reviewed the triage vital signs and the nursing notes.  Pertinent labs & imaging results that were available during my care of the patient were reviewed by me and  considered in my medical decision making (see chart for details).     David Curtis is a 57 y.o. male who presents to ED for cough, congestion, shortness of breath over the last 4 days.  He is a daily smoker, however has not smoked in the last 4 days due to worsening cough.  He is afebrile, hemodynamically stable.  He does have expiratory wheezing bilaterally.  He was given neb treatment x2 with improvement in his symptoms subjectively as well as improvement in his lung sounds. CXR without pneumonia. Will treat acute flare of chronic bronchitis with doxycycline and steroid burst.  Refilled his home inhaler.  He has enough of his albuterol inhaler.  PCP follow-up encouraged.  Reasons to return to the ER discussed and all questions answered.   Final Clinical Impressions(s) / ED Diagnoses   Final diagnoses:  COPD exacerbation [facility])    ED Discharge Orders         Ordered    doxycycline (VIBRAMYCIN) 100 MG capsule  2 times daily     09/16/18 1427    fluticasone furoate-vilanterol (BREO ELLIPTA) 200-25 MCG/INH AEPB  Daily     09/16/18 1427    predniSONE (DELTASONE) 20 MG tablet  Daily     09/16/18 1427           Shayra Anton, Ozella Almond, PA-C 09/16/18 1500    T[place], Gwenyth Allegra, MD 09/17/18 [id number]

## 2018-09-22 ENCOUNTER — Encounter: Payer: Self-pay | Admitting: Family Medicine

## 2018-10-01 ENCOUNTER — Other Ambulatory Visit: Payer: Self-pay

## 2018-10-01 NOTE — Patient Outreach (Signed)
[facility]) Care Management  10/01/2018  David Curtis September 14, 1961 [id number]   Medication Adherence call to David Curtis patient did not answer patient is due on Lisinopril 40 mg under [name].   [facility] Management Direct Dial [phone]  Fax [phone] [name]

## 2018-11-08 ENCOUNTER — Other Ambulatory Visit: Payer: Self-pay

## 2018-11-08 NOTE — Patient Outreach (Signed)
[facility]) Care Management  11/08/2018  Alphons Burgert 1961-08-22 [id number]   Medication Adherence call to Mr. [name] patient did not answer his telephone is restricted  patient is due on Lisinopril 40 mg.Mr. Kaeding is showing past due under [place].   [facility] Management Direct Dial [phone]  Fax [phone] [email or web address]

## 2019-03-21 ENCOUNTER — Other Ambulatory Visit: Payer: Self-pay

## 2019-03-21 ENCOUNTER — Emergency Department (HOSPITAL_COMMUNITY)
Admission: EM | Admit: 2019-03-21 | Discharge: 2019-03-21 | Disposition: A | Discharge status: Home / Self Care | Payer: Medicare Other | Attending: Emergency Medicine | Admitting: Emergency Medicine

## 2019-03-21 ENCOUNTER — Encounter (HOSPITAL_COMMUNITY): Payer: Self-pay | Admitting: Emergency Medicine

## 2019-03-21 DIAGNOSIS — S0185XA Open bite of other part of head, initial encounter: Secondary | ICD-10-CM | POA: Insufficient documentation

## 2019-03-21 DIAGNOSIS — Z96661 Presence of right artificial ankle joint: Secondary | ICD-10-CM | POA: Insufficient documentation

## 2019-03-21 DIAGNOSIS — Y92009 Unspecified place in unspecified non-institutional (private) residence as the place of occurrence of the external cause: Secondary | ICD-10-CM | POA: Insufficient documentation

## 2019-03-21 DIAGNOSIS — Y939 Activity, unspecified: Secondary | ICD-10-CM | POA: Insufficient documentation

## 2019-03-21 DIAGNOSIS — J449 Chronic obstructive pulmonary disease, unspecified: Secondary | ICD-10-CM | POA: Diagnosis not present

## 2019-03-21 DIAGNOSIS — F17210 Nicotine dependence, cigarettes, uncomplicated: Secondary | ICD-10-CM | POA: Insufficient documentation

## 2019-03-21 DIAGNOSIS — I10 Essential (primary) hypertension: Secondary | ICD-10-CM | POA: Diagnosis not present

## 2019-03-21 DIAGNOSIS — W540XXA Bitten by dog, initial encounter: Secondary | ICD-10-CM | POA: Insufficient documentation

## 2019-03-21 DIAGNOSIS — Z79899 Other long term (current) drug therapy: Secondary | ICD-10-CM | POA: Diagnosis not present

## 2019-03-21 DIAGNOSIS — Y999 Unspecified external cause status: Secondary | ICD-10-CM | POA: Diagnosis not present

## 2019-03-21 DIAGNOSIS — Z23 Encounter for immunization: Secondary | ICD-10-CM | POA: Diagnosis not present

## 2019-03-21 DIAGNOSIS — S0181XA Laceration without foreign body of other part of head, initial encounter: Secondary | ICD-10-CM | POA: Diagnosis not present

## 2019-03-21 DIAGNOSIS — T148XXA Other injury of unspecified body region, initial encounter: Secondary | ICD-10-CM

## 2019-03-21 MED ORDER — AMOXICILLIN-POT CLAVULANATE 875-125 MG PO TABS
1.0000 | ORAL_TABLET | Freq: Two times a day (BID) | ORAL | 0 refills | Status: DC
Start: 2019-03-21 — End: 2022-03-26

## 2019-03-21 MED ORDER — LIDOCAINE-EPINEPHRINE (PF) 2 %-1:200000 IJ SOLN
INTRAMUSCULAR | Status: AC
  Administered 2019-03-21: 02:00:00
  Filled 2019-03-21: qty 20

## 2019-03-21 MED ORDER — HYDROCODONE-ACETAMINOPHEN 5-325 MG PO TABS
1.0000 | ORAL_TABLET | Freq: Once | ORAL | Status: AC
  Administered 2019-03-21: 1 via ORAL
  Filled 2019-03-21: qty 1

## 2019-03-21 MED ORDER — AMOXICILLIN-POT CLAVULANATE 875-125 MG PO TABS
1.0000 | ORAL_TABLET | Freq: Once | ORAL | Status: AC
  Administered 2019-03-21: 02:00:00 1 via ORAL
  Filled 2019-03-21: qty 1

## 2019-03-21 MED ORDER — TETANUS-DIPHTH-ACELL PERTUSSIS 5-2.5-18.5 LF-MCG/0.5 IM SUSP
0.5000 mL | Freq: Once | INTRAMUSCULAR | Status: AC
  Administered 2019-03-21: 02:00:00 0.5 mL via INTRAMUSCULAR
  Filled 2019-03-21: qty 0.5

## 2019-03-21 NOTE — Discharge Instructions (Addendum)
The stitches that were placed  will dissolve on their own If you have any swelling, redness, pain or fever above 100 in the next 2 days please return to ER

## 2019-03-21 NOTE — ED Provider Notes (Signed)
[facility] EMERGENCY DEPARTMENT Provider Note   CSN: [id number] Arrival date & time: 03/21/19  0107    History   Chief Complaint Chief Complaint  Patient presents with  . Animal Bite    HPI David Curtis is a 58 y.o. male.     The history is provided by the patient.  Animal Bite  Contact animal:  Dog Location:  Face Facial injury location:  Face Time since incident:  45 minutes Pain details:    Quality:  Aching   Severity:  Moderate   Timing:  Constant   Progression:  Unchanged Incident location:  Home Notifications:  None Animal's rabies vaccination status:  Up to date Animal in possession: yes   Tetanus status:  Unknown Relieved by:  None tried Worsened by:  Nothing Associated symptoms: no fever   Patient history of COPD, GERD, alcohol abuse presents for dog bite.  He reports he was clipping a nail on his dog when the dog bit him in the face.  He has pain in the face.  No visual changes.  No other acute complaints.  He admits to alcohol use  Dogs vaccinations are up-to-date  Past Medical History:  Diagnosis Date  . Arthritis   . COPD (chronic obstructive pulmonary disease) ([place])   . GERD (gastroesophageal reflux disease)   . Hypertension     Patient Active Problem List   Diagnosis Date Noted  . COPD (chronic obstructive pulmonary disease) ([place]) 09/22/2016  . Atherosclerosis of aorta ([place]) 09/22/2016  . History of colonic polyps   . Hemorrhoid   . Substance abuse ([place]) 06/20/2015  . Gastritis 01/09/2015  . Tobacco use disorder 01/09/2015  . Heavy alcohol use 01/09/2015  . Essential hypertension 01/09/2015  . Chronic ankle pain 01/09/2015  . Liver mass 01/02/2015  . Pancreatic mass 01/02/2015  . Rectal bleeding 01/02/2015  . Fatigue 01/02/2015  . Loss of weight 01/02/2015  . Abdominal pain 01/02/2015    Past Surgical History:  Procedure Laterality Date  . APPENDECTOMY  2011  . COLONOSCOPY     Approx 2000 in [place], [place];  infectious colitis, no polyps/masses (per patient; records not available)  . COLONOSCOPY WITH PROPOFOL N/A 08/09/2015   [id number] hemorrhoids  . JOINT REPLACEMENT Right    ankle- has been broken 4x  . POLYPECTOMY N/A 08/09/2015   Procedure: POLYPECTOMY;  Surgeon: Daneil Dolin, MD;  Location: [facility];  Service: Endoscopy;  Laterality: N/A;        Home Medications    Prior to Admission medications   Medication Sig Start Date End Date Taking? Authorizing Provider  acetaminophen (TYLENOL) 500 MG tablet Take 500 mg by mouth every 6 (six) hours as needed for mild pain or moderate pain.    [provider]  albuterol (PROVENTIL HFA;VENTOLIN HFA) 108 (90 Base) MCG/ACT inhaler Inhale 2 puffs into the lungs every 4 (four) hours as needed for wheezing or shortness of breath. 05/05/18   Alycia Rossetti, MD  albuterol (PROVENTIL) (2.5 MG/3ML) 0.083% nebulizer solution Take 3 mLs (2.5 mg total) by nebulization every 6 (six) hours as needed for wheezing or shortness of breath. 02/24/18   Alycia Rossetti, MD  amoxicillin-clavulanate (AUGMENTIN) 875-125 MG tablet Take 1 tablet by mouth 2 (two) times daily. One po bid x 7 days 03/21/19   Ripley Fraise, MD  diclofenac (VOLTAREN) 50 MG EC tablet Take 1 tablet (50 mg total) by mouth 2 (two) times daily. 05/21/18   Ashley Murrain, NP  fluticasone furoate-vilanterol (BREO ELLIPTA) 200-25 MCG/INH AEPB Inhale 1 puff into the lungs daily. 09/16/18   Ward, Ozella Almond, PA-C  hydrochlorothiazide (MICROZIDE) 12.5 MG capsule Take 1 capsule (12.5 mg total) by mouth daily. 05/05/18   Gridley, Modena Nunnery, MD  lidocaine (LIDODERM) 5 % Place 1 patch onto the skin daily. Remove & Discard patch within 12 hours or as directed by MD 05/21/18   Ashley Murrain, NP  lisinopril (PRINIVIL,ZESTRIL) 40 MG tablet Take 1 tablet (40 mg total) by mouth daily. 05/05/18   [place], Modena Nunnery, MD  methocarbamol (ROBAXIN) 500 MG tablet Take 1 tablet (500 mg total) by mouth 2 (two) times  daily. 05/21/18   Ashley Murrain, NP  promethazine (PHENERGAN) 25 MG tablet Take 1 tablet (25 mg total) by mouth every 6 (six) hours as needed for nausea or vomiting. 02/22/18   Orlena Sheldon, PA-C  simvastatin (ZOCOR) 20 MG tablet Take 1 tablet (20 mg total) by mouth at bedtime. 05/05/18   Alycia Rossetti, MD    Family History Family History  Problem Relation Age of Onset  . Arthritis Mother   . Cancer Mother        unknown type  . Diabetes Mother   . Hypertension Mother   . Arthritis Sister   . Cancer Sister        unknown type, possibly breast CA  . COPD Brother   . Arthritis Sister   . Arthritis Sister   . Arthritis Sister   . Colon cancer Neg Hx   . Pancreatic cancer Neg Hx     Social History Social History   Tobacco Use  . Smoking status: Current Every Day Smoker    Packs/day: 3.00    Years: 40.00    Pack years: 120.00    Types: Cigarettes  . Smokeless tobacco: Former Network engineer Use Topics  . Alcohol use: Yes    Alcohol/week: 12.0 standard drinks    Types: 12 Cans of beer per week    Comment: 1/2 pint and six pack a day.  . Drug use: Not Currently    Types: Marijuana, Cocaine    Comment: 1 month ago, cocaine     Allergies   Patient has no known allergies.   Review of Systems Review of Systems  Constitutional: Negative for fever.  Eyes: Negative for visual disturbance.  Skin: Positive for wound.     Physical Exam Updated Vital Signs BP (!) 151/89 (BP Location: Right Arm)   Pulse 88   Temp 97.9 F (36.6 C) (Oral)   Resp 18   Ht 1.727 m (5\' 8" )   Wt 90.7 kg   SpO2 99%   BMI 30.41 kg/m   Physical Exam CONSTITUTIONAL: Disheveled, appears intoxicated HEAD: Normocephalic/atraumatic EYES: EOMI/PERRL, no proptosis, no evidence of any globe injury.  No consensual pain. Small abrasion noted to eyelid that is not deep. No Foreign bodies noted to the left eye. No dental or nasal injury noted.  No facial tenderness or crepitus noted. ENMT: Mucous  membranes moist NECK: supple no meningeal signs LUNGS:  no apparent distress ABDOMEN: soft NEURO: Pt is awake/alert/appropriate, moves all extremitiesx4.  No facial droop.   EXTREMITIES: pulses normal/equal, full ROM SKIN: warm, color normal PSYCH: no abnormalities of mood noted, alert and oriented to situation  Patient gave verbal permission to utilize photo for medical documentation only The image was not stored on any personal device        ED Treatments / Results  Labs (all labs ordered are listed, but only abnormal results are displayed) Labs Reviewed - No data to display  EKG None  Radiology No results found.  Procedures .Marland KitchenLaceration Repair Date/Time: 03/21/2019 2:18 AM Performed by: Ripley Fraise, MD Authorized by: Ripley Fraise, MD   Consent:    Consent obtained:  Verbal   Consent given by:  Patient Anesthesia (see MAR for exact dosages):    Anesthesia method:  Local infiltration   Local anesthetic:  Lidocaine 2% WITH epi Laceration details:    Location:  Face   Length (cm):  3 Repair type:    Repair type:  Complex Pre-procedure details:    Preparation:  Patient was prepped and draped in usual sterile fashion Exploration:    Wound exploration: wound explored through full range of motion and entire depth of wound probed and visualized     Wound extent: no foreign bodies/material noted     Contaminated: no   Treatment:    Amount of cleaning:  Standard   Irrigation solution:  Sterile saline   Irrigation method:  Syringe   Debridement:  Minimal Skin repair:    Repair method:  Sutures   Suture size:  5-0   Suture material:  Chromic gut   Suture technique:  Simple interrupted   Number of sutures:  4 Approximation:    Approximation:  Close Post-procedure details:    Patient tolerance of procedure:  Tolerated well, no immediate complications      Medications Ordered in ED Medications  HYDROcodone-acetaminophen (NORCO/VICODIN) 5-325 MG per  tablet 1 tablet (has no administration in time range)  Tdap (BOOSTRIX) injection 0.5 mL (0.5 mLs Intramuscular Given 03/21/19 0212)  amoxicillin-clavulanate (AUGMENTIN) 875-125 MG per tablet 1 tablet (1 tablet Oral Given 03/21/19 0212)  lidocaine-EPINEPHrine (XYLOCAINE W/EPI) 2 %-1:200000 (PF) injection (  Given 03/21/19 0216)     Initial Impression / Assessment and Plan / ED Course  I have reviewed the triage vital signs and the nursing notes.        2:16 AM Presents after dog bite to his face. The dog is his and is fully vaccinated.  This occurred while he was trying to clip the dog's nails He has no signs of globe injury He denies visual changes No signs of bony injury/fracture to facial/nasal bones Would was cleansed extensively prior to laceration repair   Post procedure photo:     Discussed wound care precautions Will start oral antibiotics Final Clinical Impressions(s) / ED Diagnoses   Final diagnoses:  Dog bite of face, initial encounter  Animal bite    ED Discharge Orders         Ordered    amoxicillin-clavulanate (AUGMENTIN) 875-125 MG tablet  2 times daily     03/21/19 0136           Ripley Fraise, MD 03/21/19 [phone]

## 2019-03-21 NOTE — ED Notes (Addendum)
RN flushed wound w/ 30 ml flush, using splash guard. Pt tolerated well.

## 2019-03-21 NOTE — ED Triage Notes (Signed)
Pt presents to ED c/o of his dog biting him that occurred 45 minutes ago. Pt reports dog has all of its shots. Lac under left eye. Pain is a 6/10. No change in vision. Pt reports drinking bourbon for pain.

## 2019-03-21 NOTE — ED Notes (Signed)
ED Provider at bedside. 

## 2020-03-31 DIAGNOSIS — K643 Fourth degree hemorrhoids: Secondary | ICD-10-CM | POA: Diagnosis not present

## 2020-03-31 DIAGNOSIS — I10 Essential (primary) hypertension: Secondary | ICD-10-CM | POA: Diagnosis not present

## 2021-08-28 ENCOUNTER — Other Ambulatory Visit: Payer: Self-pay | Admitting: Registered Nurse

## 2021-09-16 ENCOUNTER — Telehealth: Payer: Self-pay

## 2021-09-16 NOTE — Telephone Encounter (Signed)
NOTES SCANNED TO REFERRAL 

## 2021-09-17 ENCOUNTER — Other Ambulatory Visit: Payer: Self-pay | Admitting: Registered Nurse

## 2021-09-17 ENCOUNTER — Other Ambulatory Visit: Payer: Self-pay

## 2021-09-17 ENCOUNTER — Ambulatory Visit
Admission: RE | Admit: 2021-09-17 | Discharge: 2021-09-17 | Disposition: A | Discharge status: Home / Self Care | Payer: Medicare Other | Source: Ambulatory Visit | Attending: Registered Nurse | Admitting: Registered Nurse

## 2021-09-17 DIAGNOSIS — M25571 Pain in right ankle and joints of right foot: Secondary | ICD-10-CM

## 2021-09-17 DIAGNOSIS — R52 Pain, unspecified: Secondary | ICD-10-CM

## 2021-09-17 DIAGNOSIS — M25561 Pain in right knee: Secondary | ICD-10-CM

## 2021-09-17 DIAGNOSIS — M25551 Pain in right hip: Secondary | ICD-10-CM

## 2021-09-17 DIAGNOSIS — M4726 Other spondylosis with radiculopathy, lumbar region: Secondary | ICD-10-CM

## 2021-09-18 ENCOUNTER — Ambulatory Visit (INDEPENDENT_AMBULATORY_CARE_PROVIDER_SITE_OTHER): Payer: Medicare Other | Admitting: Cardiology

## 2021-09-18 ENCOUNTER — Encounter: Payer: Self-pay | Admitting: Cardiology

## 2021-09-18 VITALS — BP 136/86 | HR 80 | Ht 68.0 in | Wt 179.0 lb

## 2021-09-18 DIAGNOSIS — I700 Atherosclerosis of aorta: Secondary | ICD-10-CM | POA: Diagnosis not present

## 2021-09-18 DIAGNOSIS — M79604 Pain in right leg: Secondary | ICD-10-CM | POA: Diagnosis not present

## 2021-09-18 DIAGNOSIS — I10 Essential (primary) hypertension: Secondary | ICD-10-CM | POA: Diagnosis not present

## 2021-09-18 DIAGNOSIS — R079 Chest pain, unspecified: Secondary | ICD-10-CM | POA: Diagnosis not present

## 2021-09-18 DIAGNOSIS — M79605 Pain in left leg: Secondary | ICD-10-CM

## 2021-09-18 DIAGNOSIS — M79606 Pain in leg, unspecified: Secondary | ICD-10-CM | POA: Insufficient documentation

## 2021-09-18 DIAGNOSIS — F17200 Nicotine dependence, unspecified, uncomplicated: Secondary | ICD-10-CM

## 2021-09-18 MED ORDER — ROSUVASTATIN CALCIUM 20 MG PO TABS: 20.0000 mg | ORAL_TABLET | Freq: Every day | ORAL | 3 refills | Status: AC

## 2021-09-18 MED ORDER — METOPROLOL TARTRATE 100 MG PO TABS: 100.0000 mg | ORAL_TABLET | Freq: Once | ORAL | 0 refills | Status: AC

## 2021-09-18 NOTE — Assessment & Plan Note (Signed)
Continue to encourage tobacco cessation. 

## 2021-09-18 NOTE — Assessment & Plan Note (Signed)
Continue to monitor on current regimen.  Reasonably controlled.

## 2021-09-18 NOTE — Patient Instructions (Addendum)
Medication Instructions:   STOP TAKING: SIMVASTATIN 20 MG   START TAKING: ROSUVASTATIN  20 MG ONCE A DAY   TAKE ONLY ONCE:  METOPROLOL 100 MG TABLET 2 HOURS PRIOR TO PROCEDURE    *If you need a refill on your cardiac medications before your next appointment, please call your pharmacy*   Lab Work: BMET TODAY    If you have labs (blood work) drawn today and your tests are completely normal, you will receive your results only by: MyChart Message (if you have MyChart) OR A paper copy in the mail If you have any lab test that is abnormal or we need to change your treatment, we will call you to review the results.   Testing/Procedures: Non-Cardiac CT Angiography (CTA), is a special type of CT scan that uses a computer to produce multi-dimensional views of major blood vessels throughout the body. In CT angiography, a contrast material is injected through an IV to help visualize the blood vessels   Your physician has requested that you have a lower or upper extremity arterial duplex. This test is an ultrasound of the arteries in the legs or arms. It looks at arterial blood flow in the legs and arms. Allow one hour for Lower and Upper Arterial scans. There are no restrictions or special instructions   Follow-Up: At [facility], you and your health needs are our priority.  As part of our continuing mission to provide you with exceptional heart care, we have created designated Provider Care Teams.  These Care Teams include your primary Cardiologist (physician) and Advanced Practice Providers (APPs -  Physician Assistants and Nurse Practitioners) who all work together to provide you with the care you need, when you need it.  We recommend signing up for the patient portal called "MyChart".  Sign up information is provided on this After Visit Summary.  MyChart is used to connect with patients for Virtual Visits (Telemedicine).  Patients are able to view lab/test results, encounter notes, upcoming  appointments, etc.  Non-urgent messages can be sent to your provider as well.   To learn more about what you can do with MyChart, go to [email or web address].    Your next appointment:   6 month(s)  The format for your next appointment:   In Person  Provider:  AVAILABLE  APP   Other Instructions     Your cardiac CT will be scheduled at one of the below locations:   [facility] [address] [place], [place] [address] [phone]  [place] [address] [place], [place] [address] [phone]  If scheduled at [facility], please arrive at the [facility] main entrance (entrance A) of [facility] 30 minutes prior to test start time. You can use the FREE valet parking offered at the main entrance (encouraged to control the heart rate for the test) Proceed to the [facility] Radiology Department (first floor) to check-in and test prep.  If scheduled at [facility], please arrive 15 mins early for check-in and test prep.  Please follow these instructions carefully (unless otherwise directed):  Hold all erectile dysfunction medications at least 3 days (72 hrs) prior to test.  On the Night Before the Test: Be sure to Drink plenty of water. Do not consume any caffeinated/decaffeinated beverages or chocolate 12 hours prior to your test. Do not take any antihistamines 12 hours prior to your test.  On the Day of the Test:  Drink plenty of water until 1 hour prior to the test. Do not eat any food 4 hours prior to the test. You may take your regular medications prior to the test.  Take metoprolol (Lopressor) two hours prior to test. HOLD Furosemide/Hydrochlorothiazide morning of the test.  After the Test: Drink plenty of water. After receiving IV contrast, you may experience a mild flushed feeling. This is normal. On occasion, you may experience a mild rash up to 24  hours after the test. This is not dangerous. If this occurs, you can take Benadryl 25 mg and increase your fluid intake. If you experience trouble breathing, this can be serious. If it is severe call 911 IMMEDIATELY. If it is mild, please call our office. If you take any of these medications: Glipizide/Metformin, Avandament, Glucavance, please do not take 48 hours after completing test unless otherwise instructed.  Please allow 2-4 weeks for scheduling of routine cardiac CTs. Some insurance companies require a pre-authorization which may delay scheduling of this test.   For non-scheduling related questions, please contact the cardiac imaging nurse navigator should you have any questions/concerns: Marchia Bond, Cardiac Imaging Nurse Navigator Gordy Clement, Cardiac Imaging Nurse Navigator [facility] Direct Office Dial: [phone]   For scheduling needs, including cancellations and rescheduling, please call [place], [phone].

## 2021-09-18 NOTE — Assessment & Plan Note (Signed)
Changed his simvastatin 20 over to Crestor 20 for high potency.

## 2021-09-18 NOTE — Progress Notes (Signed)
Cardiology Office Note:    Date:  09/18/2021   ID:  David Curtis, DOB Nov 13, 1961, MRN [id number]  PCP:  Arthur Holms, NP   [facility] Providers Cardiologist:  None     Referring MD: Arthur Holms, NP    History of Present Illness:    David Curtis is a 60 y.o. male here for the evaluation of aortic atherosclerosis at the request of Arthur Holms, NP.  He has reported intermittent chest pain.  At times it can be quite severe.  Sometimes it can be exertional and he has to stop he states.  Other times, he can wake up in the morning with burning in his chest.  Just recently has started back on aspirin and statin.  Has been a heavy smoker since the age of 50.  Previously used cocaine.  Alcohol use.  He is also been noticing pretty significant right leg pain especially with walking.  Previously has had radiculopathy diagnosed.  Aortic atherosclerosis was noted on prior chest CT in 2017.  Smoking cessation was encouraged.  The CT scan in 2017 also showed atherosclerosis to the left main and three-vessel coronaries.  LDL cholesterol was 144 triglycerides 189 total cholesterol 248 HDL 71. Creatinine 0.89 sodium 141 potassium 4.3  Past Medical History:  Diagnosis Date   Alcohol dependence ([place])    Aortic atherosclerosis (HCC)    Arthritis    CAD (coronary artery disease)    Cigarette nicotine dependence    Cocaine use    COPD (chronic obstructive pulmonary disease) (HCC)    GERD (gastroesophageal reflux disease)    Hemorrhoids    Hypertension    Lipoma    Liver nodule    OA (osteoarthritis)    Overweight    Pancreatic cyst    Prediabetes    Pulmonary nodules     Past Surgical History:  Procedure Laterality Date   APPENDECTOMY  2011   COLONOSCOPY     Approx 2000 in [place], [place]; infectious colitis, no polyps/masses (per patient; records not available)   COLONOSCOPY WITH PROPOFOL N/A 08/09/2015   HFW:YOVZCHYI hemorrhoids   JOINT REPLACEMENT Right    ankle- has been broken 4x    POLYPECTOMY N/A 08/09/2015   Procedure: POLYPECTOMY;  Surgeon: Daneil Dolin, MD;  Location: [facility];  Service: Endoscopy;  Laterality: N/A;    Current Medications: Current Meds  Medication Sig   acetaminophen (TYLENOL) 500 MG tablet Take 500 mg by mouth every 6 (six) hours as needed for mild pain or moderate pain.   albuterol (PROVENTIL HFA;VENTOLIN HFA) 108 (90 Base) MCG/ACT inhaler Inhale 2 puffs into the lungs every 4 (four) hours as needed for wheezing or shortness of breath.   albuterol (PROVENTIL) (2.5 MG/3ML) 0.083% nebulizer solution Take 3 mLs (2.5 mg total) by nebulization every 6 (six) hours as needed for wheezing or shortness of breath.   amoxicillin-clavulanate (AUGMENTIN) 875-125 MG tablet Take 1 tablet by mouth 2 (two) times daily. One po bid x 7 days   diclofenac (VOLTAREN) 50 MG EC tablet Take 1 tablet (50 mg total) by mouth 2 (two) times daily.   fluticasone furoate-vilanterol (BREO ELLIPTA) 200-25 MCG/INH AEPB Inhale 1 puff into the lungs daily.   hydrochlorothiazide (MICROZIDE) 12.5 MG capsule Take 1 capsule (12.5 mg total) by mouth daily.   lidocaine (LIDODERM) 5 % Place 1 patch onto the skin daily. Remove & Discard patch within 12 hours or as directed by MD   lisinopril (PRINIVIL,ZESTRIL) 40 MG tablet Take 1 tablet (40 mg  total) by mouth daily.   methocarbamol (ROBAXIN) 500 MG tablet Take 1 tablet (500 mg total) by mouth 2 (two) times daily.   metoprolol tartrate (LOPRESSOR) 100 MG tablet Take 1 tablet (100 mg total) by mouth once for 1 dose. 2 HOURS PRIOR TO PROCEDURE   promethazine (PHENERGAN) 25 MG tablet Take 1 tablet (25 mg total) by mouth every 6 (six) hours as needed for nausea or vomiting.   rosuvastatin (CRESTOR) 20 MG tablet Take 1 tablet (20 mg total) by mouth daily.   [DISCONTINUED] simvastatin (ZOCOR) 20 MG tablet Take 1 tablet (20 mg total) by mouth at bedtime.     Allergies:   Patient has no known allergies.   Social History   Socioeconomic History    Marital status: Single    Spouse name: Not on file   Number of children: Not on file   Years of education: Not on file   Highest education level: Not on file  Occupational History   Not on file  Tobacco Use   Smoking status: Every Day    Packs/day: 3.00    Years: 40.00    Pack years: 120.00    Types: Cigarettes   Smokeless tobacco: Former  Substance and Sexual Activity   Alcohol use: Yes    Alcohol/week: 12.0 standard drinks    Types: 12 Cans of beer per week    Comment: 1/2 pint and six pack a day.   Drug use: Not Currently    Types: Marijuana, Cocaine    Comment: 1 month ago, cocaine   Sexual activity: Yes  Other Topics Concern   Not on file  Social History Narrative   Not on file   Social Determinants of Health   Financial Resource Strain: Not on file  Food Insecurity: Not on file  Transportation Needs: Not on file  Physical Activity: Not on file  Stress: Not on file  Social Connections: Not on file     Family History: The patient's family history includes Arthritis in his mother, sister, sister, sister, and sister; COPD in his brother; Cancer in his mother and sister; Diabetes in his mother; Hypertension in his mother. There is no history of Colon cancer or Pancreatic cancer.  ROS:   Please see the history of present illness.     All other systems reviewed and are negative.  EKGs/Labs/Other Studies Reviewed:    The following studies were reviewed today: Prior office notes, lab work, EKGs  EKG:  EKG is  ordered today.  The ekg ordered today demonstrates sinus rhythm with nonspecific ST-T wave changes  Recent Labs: No results found for requested labs within last 8760 hours.  Recent Lipid Panel    Component Value Date/Time   CHOL 225 (H) 02/24/2018 1153   TRIG 146 02/24/2018 1153   HDL 41 02/24/2018 1153   CHOLHDL 5.5 (H) 02/24/2018 1153   VLDL 33 01/09/2015 1545   LDLCALC 156 (H) 02/24/2018 1153     Risk Assessment/Calculations:           Physical Exam:    VS:  BP 136/86   Pulse 80   Ht 5\' 8"  (1.727 m)   Wt 179 lb (81.2 kg)   SpO2 95%   BMI 27.22 kg/m     Wt Readings from Last 3 Encounters:  09/18/21 179 lb (81.2 kg)  03/21/19 200 lb (90.7 kg)  09/16/18 182 lb (82.6 kg)     GEN:  Well nourished, well developed in no acute distress HEENT: Normal  NECK: No JVD; No carotid bruits LYMPHATICS: No lymphadenopathy CARDIAC: RRR, no murmurs, rubs, gallops RESPIRATORY:  Clear to auscultation without rales, wheezing or rhonchi  ABDOMEN: Soft, non-tender, non-distended MUSCULOSKELETAL:  No edema; No deformity  SKIN: Warm and dry NEUROLOGIC:  Alert and oriented x 3 PSYCHIATRIC:  Normal affect   ASSESSMENT:    1. Essential hypertension   2. Chest pain, unspecified type   3. Leg pain, bilateral   4. Chest pain of uncertain etiology   5. Atherosclerosis of aorta ([place])   6. Tobacco use disorder   7. Pain of right lower extremity    PLAN:    In order of problems listed above:  Chest pain of uncertain etiology Given his multitude of risk factors as well as known coronary artery calcification seen on prior CT scan, I will go ahead and order a with possible FFR analysis for further evaluation.  Atherosclerosis of aorta (HCC) Changed his simvastatin 20 over to Crestor 20 for high potency.  Essential hypertension Continue to monitor on current regimen.  Reasonably controlled.  Tobacco use disorder Continue to encourage tobacco cessation.  Leg pain Pain of his right limb especially with ambulation.  I know he has been diagnosed with radiculopathy however given his atherosclerosis noted on prior CT, we will check lower extremity ABIs and arterial ultrasounds to make sure that there are no evidence of significant blood flow limitations.   40-month follow-up with APP      Medication Adjustments/Labs and Tests Ordered: Current medicines are reviewed at length with the patient today.  Concerns regarding medicines  are outlined above.  Orders Placed This Encounter  Procedures   CT CORONARY MORPH W/CTA COR W/SCORE W/CA W/CM &/OR WO/CM   US ARTERIAL ABI (SCREENING LOWER EXTREMITY)   Basic metabolic panel   EKG 19-JYNW   Meds ordered this encounter  Medications   metoprolol tartrate (LOPRESSOR) 100 MG tablet    Sig: Take 1 tablet (100 mg total) by mouth once for 1 dose. 2 HOURS PRIOR TO PROCEDURE    Dispense:  1 tablet    Refill:  0   rosuvastatin (CRESTOR) 20 MG tablet    Sig: Take 1 tablet (20 mg total) by mouth daily.    Dispense:  90 tablet    Refill:  3     Patient Instructions  Medication Instructions:   STOP TAKING: SIMVASTATIN 20 MG   START TAKING: ROSUVASTATIN  20 MG ONCE A DAY   TAKE ONLY ONCE:  METOPROLOL 100 MG TABLET 2 HOURS PRIOR TO PROCEDURE    *If you need a refill on your cardiac medications before your next appointment, please call your pharmacy*   Lab Work: BMET TODAY    If you have labs (blood work) drawn today and your tests are completely normal, you will receive your results only by: MyChart Message (if you have MyChart) OR A paper copy in the mail If you have any lab test that is abnormal or we need to change your treatment, we will call you to review the results.   Testing/Procedures: Non-Cardiac CT Angiography (CTA), is a special type of CT scan that uses a computer to produce multi-dimensional views of major blood vessels throughout the body. In CT angiography, a contrast material is injected through an IV to help visualize the blood vessels   Your physician has requested that you have a lower or upper extremity arterial duplex. This test is an ultrasound of the arteries in the legs or arms. It looks  at arterial blood flow in the legs and arms. Allow one hour for Lower and Upper Arterial scans. There are no restrictions or special instructions   Follow-Up: At [facility], you and your health needs are our priority.  As part of our continuing mission  to provide you with exceptional heart care, we have created designated Provider Care Teams.  These Care Teams include your primary Cardiologist (physician) and Advanced Practice Providers (APPs -  Physician Assistants and Nurse Practitioners) who all work together to provide you with the care you need, when you need it.  We recommend signing up for the patient portal called "MyChart".  Sign up information is provided on this After Visit Summary.  MyChart is used to connect with patients for Virtual Visits (Telemedicine).  Patients are able to view lab/test results, encounter notes, upcoming appointments, etc.  Non-urgent messages can be sent to your provider as well.   To learn more about what you can do with MyChart, go to [email or web address].    Your next appointment:   6 month(s)  The format for your next appointment:   In Person  Provider:  AVAILABLE  APP   Other Instructions     Your cardiac CT will be scheduled at one of the below locations:   [facility] [address] [place], [place] [address] [phone]  [facility] [address] [place], [place] [address] [phone]  If scheduled at [facility], please arrive at the [facility] main entrance (entrance A) of [facility] 30 minutes prior to test start time. You can use the FREE valet parking offered at the main entrance (encouraged to control the heart rate for the test) Proceed to the [facility] Radiology Department (first floor) to check-in and test prep.  If scheduled at [facility], please arrive 15 mins early for check-in and test prep.  Please follow these instructions carefully (unless otherwise directed):  Hold all erectile dysfunction medications at least 3 days (72 hrs) prior to test.  On the Night Before the Test: Be sure to Drink plenty of water. Do not consume any caffeinated/decaffeinated  beverages or chocolate 12 hours prior to your test. Do not take any antihistamines 12 hours prior to your test.  On the Day of the Test: Drink plenty of water until 1 hour prior to the test. Do not eat any food 4 hours prior to the test. You may take your regular medications prior to the test.  Take metoprolol (Lopressor) two hours prior to test. HOLD Furosemide/Hydrochlorothiazide morning of the test.  After the Test: Drink plenty of water. After receiving IV contrast, you may experience a mild flushed feeling. This is normal. On occasion, you may experience a mild rash up to 24 hours after the test. This is not dangerous. If this occurs, you can take Benadryl 25 mg and increase your fluid intake. If you experience trouble breathing, this can be serious. If it is severe call 911 IMMEDIATELY. If it is mild, please call our office. If you take any of these medications: Glipizide/Metformin, Avandament, Glucavance, please do not take 48 hours after completing test unless otherwise instructed.  Please allow 2-4 weeks for scheduling of routine cardiac CTs. Some insurance companies require a pre-authorization which may delay scheduling of this test.   For non-scheduling related questions, please contact the cardiac imaging nurse navigator should you have any questions/concerns: Marchia Bond, Cardiac Imaging Nurse Navigator Gordy Clement,  Cardiac Imaging Nurse [place] [facility] Direct Office Dial: [phone]   For scheduling needs, including cancellations and rescheduling, please call [place], [phone].    Signed, Candee Furbish, MD  09/18/2021 5:17 PM    [place]

## 2021-09-18 NOTE — Assessment & Plan Note (Signed)
Pain of his right limb especially with ambulation.  I know he has been diagnosed with radiculopathy however given his atherosclerosis noted on prior CT, we will check lower extremity ABIs and arterial ultrasounds to make sure that there are no evidence of significant blood flow limitations.

## 2021-09-18 NOTE — Assessment & Plan Note (Signed)
Given his multitude of risk factors as well as known coronary artery calcification seen on prior CT scan, I will go ahead and order a with possible FFR analysis for further evaluation.

## 2021-09-24 ENCOUNTER — Ambulatory Visit (HOSPITAL_COMMUNITY)
Admission: RE | Admit: 2021-09-24 | Discharge: 2021-09-24 | Disposition: A | Discharge status: Home / Self Care | Payer: Medicare Other | Source: Ambulatory Visit | Attending: Cardiology | Admitting: Cardiology

## 2021-09-24 ENCOUNTER — Other Ambulatory Visit (HOSPITAL_COMMUNITY)
Admission: RE | Admit: 2021-09-24 | Discharge: 2021-09-24 | Disposition: A | Discharge status: Home / Self Care | Payer: Medicare Other | Source: Ambulatory Visit | Attending: Cardiology | Admitting: Cardiology

## 2021-09-24 ENCOUNTER — Other Ambulatory Visit: Payer: Self-pay

## 2021-09-24 DIAGNOSIS — R079 Chest pain, unspecified: Secondary | ICD-10-CM

## 2021-09-24 DIAGNOSIS — M79604 Pain in right leg: Secondary | ICD-10-CM | POA: Insufficient documentation

## 2021-09-24 DIAGNOSIS — M79605 Pain in left leg: Secondary | ICD-10-CM | POA: Insufficient documentation

## 2021-09-24 LAB — BASIC METABOLIC PANEL
Anion gap: 7 (ref 5–15)
BUN: 13 mg/dL (ref 6–20)
CO2: 27 mmol/L (ref 22–32)
Calcium: 9.3 mg/dL (ref 8.9–10.3)
Chloride: 102 mmol/L (ref 98–111)
Creatinine, Ser: 0.77 mg/dL (ref 0.61–1.24)
GFR, Estimated: >60 mL/min (ref >60)
Glucose, Bld: 115 mg/dL — ABNORMAL HIGH (ref 70–99)
Potassium: 4.3 mmol/L (ref 3.5–5.1)
Sodium: 136 mmol/L (ref 135–145)

## 2021-09-26 ENCOUNTER — Telehealth (HOSPITAL_COMMUNITY): Payer: Self-pay | Admitting: *Deleted

## 2021-09-26 NOTE — Telephone Encounter (Signed)
Reaching out to patient to offer assistance regarding upcoming cardiac imaging study; pt verbalizes understanding of appt date/time, parking situation and where to check in, pre-test NPO status and medications ordered, and verified current allergies; name and call back number provided for further questions should they arise ° °Morrie Daywalt RN Navigator Cardiac Imaging °[facility] °[phone] office °[phone] cell  ° °Patient to take 100mg metoprolol tartrate two hours prior to cardiac CT scan. °

## 2021-09-27 ENCOUNTER — Ambulatory Visit (HOSPITAL_COMMUNITY)
Admission: RE | Admit: 2021-09-27 | Discharge: 2021-09-27 | Disposition: A | Discharge status: Home / Self Care | Payer: Medicare Other | Source: Ambulatory Visit | Attending: Cardiology | Admitting: Cardiology

## 2021-09-27 MED ORDER — NITROGLYCERIN 0.4 MG SL SUBL: 0.8000 mg | SUBLINGUAL_TABLET | Freq: Once | SUBLINGUAL | Status: DC

## 2021-11-07 ENCOUNTER — Telehealth (HOSPITAL_COMMUNITY): Payer: Self-pay | Admitting: Emergency Medicine

## 2021-11-07 ENCOUNTER — Other Ambulatory Visit (HOSPITAL_COMMUNITY): Payer: Self-pay | Admitting: *Deleted

## 2021-11-07 DIAGNOSIS — Z01812 Encounter for preprocedural laboratory examination: Secondary | ICD-10-CM

## 2021-11-07 NOTE — Telephone Encounter (Signed)
Reaching out to patient to offer assistance regarding upcoming cardiac imaging study; pt verbalizes understanding of appt date/time, parking situation and where to check in, pre-test NPO status and medications ordered, and verified current allergies; name and call back number provided for further questions should they arise Marchia Bond RN Navigator Cardiac Imaging [facility] [facility] [phone] office [phone] cell  Reminded to get labs - will go to [address] office tomorrow for bmet 100mg  metoprolol tart 2 hr prior to scan Holding HCTZ Denies iv issues Arrival 11a

## 2021-11-08 ENCOUNTER — Other Ambulatory Visit: Payer: Self-pay

## 2021-11-08 ENCOUNTER — Other Ambulatory Visit: Payer: Medicare Other | Admitting: *Deleted

## 2021-11-09 LAB — BASIC METABOLIC PANEL
BUN/Creatinine Ratio: 11 (ref 10–24)
BUN: 8 mg/dL (ref 8–27)
CO2: 21 mmol/L (ref 20–29)
Calcium: 9.5 mg/dL (ref 8.6–10.2)
Chloride: 103 mmol/L (ref 96–106)
Creatinine, Ser: 0.74 mg/dL — ABNORMAL LOW (ref 0.76–1.27)
Glucose: 99 mg/dL (ref 70–99)
Potassium: 4.1 mmol/L (ref 3.5–5.2)
Sodium: 141 mmol/L (ref 134–144)
eGFR: 104 mL/min/{1.73_m2} (ref >59)

## 2021-11-11 ENCOUNTER — Ambulatory Visit (HOSPITAL_COMMUNITY)
Admission: RE | Admit: 2021-11-11 | Discharge: 2021-11-11 | Disposition: A | Discharge status: Home / Self Care | Payer: Medicare Other | Source: Ambulatory Visit | Attending: Internal Medicine | Admitting: Internal Medicine

## 2021-11-11 ENCOUNTER — Ambulatory Visit (HOSPITAL_COMMUNITY)
Admission: RE | Admit: 2021-11-11 | Discharge: 2021-11-11 | Disposition: A | Discharge status: Home / Self Care | Payer: Medicare Other | Source: Ambulatory Visit | Attending: Cardiology | Admitting: Cardiology

## 2021-11-11 ENCOUNTER — Other Ambulatory Visit: Payer: Self-pay

## 2021-11-11 ENCOUNTER — Other Ambulatory Visit: Payer: Self-pay | Admitting: Internal Medicine

## 2021-11-11 DIAGNOSIS — R351 Nocturia: Secondary | ICD-10-CM | POA: Diagnosis not present

## 2021-11-11 DIAGNOSIS — R072 Precordial pain: Secondary | ICD-10-CM | POA: Diagnosis present

## 2021-11-11 DIAGNOSIS — R079 Chest pain, unspecified: Secondary | ICD-10-CM

## 2021-11-11 DIAGNOSIS — R931 Abnormal findings on diagnostic imaging of heart and coronary circulation: Secondary | ICD-10-CM

## 2021-11-11 DIAGNOSIS — I700 Atherosclerosis of aorta: Secondary | ICD-10-CM

## 2021-11-11 MED ORDER — IOHEXOL 350 MG/ML SOLN
95.0000 mL | Freq: Once | INTRAVENOUS | Status: AC | PRN
  Administered 2021-11-11: 13:00:00 95 mL via INTRAVENOUS

## 2021-11-11 MED ORDER — NITROGLYCERIN 0.4 MG SL SUBL
SUBLINGUAL_TABLET | SUBLINGUAL | Status: AC
  Administered 2021-11-11: 12:00:00 0.8 mg via SUBLINGUAL
  Filled 2021-11-11: qty 2

## 2021-11-11 MED ORDER — NITROGLYCERIN 0.4 MG SL SUBL: 0.8000 mg | SUBLINGUAL_TABLET | Freq: Once | SUBLINGUAL | Status: AC

## 2021-11-11 NOTE — Progress Notes (Signed)
Please send for FFR - Dr. Vash Quezada 

## 2021-11-13 ENCOUNTER — Other Ambulatory Visit: Payer: Self-pay | Admitting: *Deleted

## 2021-11-13 MED ORDER — ASPIRIN EC 81 MG PO TBEC: 81.0000 mg | DELAYED_RELEASE_TABLET | Freq: Every day | ORAL | 3 refills | Status: DC

## 2021-11-13 NOTE — Progress Notes (Signed)
Added asa 81 mg to medication list.  Pt reports he has been taking it for awhile now.

## 2021-12-05 ENCOUNTER — Other Ambulatory Visit: Payer: Self-pay | Admitting: Cardiology

## 2022-01-29 ENCOUNTER — Other Ambulatory Visit: Payer: Self-pay | Admitting: Registered Nurse

## 2022-01-29 DIAGNOSIS — K862 Cyst of pancreas: Secondary | ICD-10-CM

## 2022-01-29 DIAGNOSIS — K7689 Other specified diseases of liver: Secondary | ICD-10-CM

## 2022-02-12 ENCOUNTER — Telehealth: Payer: Self-pay

## 2022-02-12 NOTE — Telephone Encounter (Signed)
? ?  Pre-operative Risk Assessment  ?  ?Patient Name: David Curtis  ?DOB: 08-Dec-1960 ?MRN: [id number]  ? ?  ? ?Request for Surgical Clearance   ? ?Procedure:   Right total knee arthroplasty ? ?Date of Surgery:  Clearance 03/25/22                              ?   ?Surgeon:  Dr. Paralee Cancel ?Surgeon's Group or Practice Name:  [facility] ?Phone number:  [phone] ATTN: Judeen Hammans ?Fax number:  [phone] ?  ?Type of Clearance Requested:   ?- Medical  ?- Pharmacy:  Hold Aspirin   ?  ?Type of Anesthesia:  Spinal ?  ?Additional requests/questions:     ? ?Signed, ?Gracia Saggese   ?02/12/2022, 12:43 PM  ? ?

## 2022-02-13 ENCOUNTER — Telehealth: Payer: Self-pay | Admitting: *Deleted

## 2022-02-13 ENCOUNTER — Ambulatory Visit
Admission: RE | Admit: 2022-02-13 | Discharge: 2022-02-13 | Disposition: A | Discharge status: Home / Self Care | Payer: Medicaid Other | Source: Ambulatory Visit | Attending: Registered Nurse | Admitting: Registered Nurse

## 2022-02-13 DIAGNOSIS — K862 Cyst of pancreas: Secondary | ICD-10-CM

## 2022-02-13 DIAGNOSIS — K7689 Other specified diseases of liver: Secondary | ICD-10-CM

## 2022-02-13 NOTE — Telephone Encounter (Signed)
ADDENDUM: CORRECTION: PROCEDURE IS FOR RIGHT TOTAL HIP ARTHROPLASTY, NOT KNEE ?

## 2022-02-13 NOTE — Telephone Encounter (Signed)
PT AGREEABLE TO PLAN OF CARE FOR TELE PRE OP APPT 02/21/22 @ 10 AM. MED REC AND CONSENT ARE DONE.  ?

## 2022-02-13 NOTE — Telephone Encounter (Signed)
PT AGREEABLE TO PLAN OF CARE FOR TELE PRE OP APPT 02/21/22 @ 10 AM. MED REC AND CONSENT ARE DONE.  ? ?  ?Patient Consent for Virtual Visit  ? ? ?   ? ?David Curtis has provided verbal consent on 02/13/2022 for a virtual visit (video or telephone). ? ? ?CONSENT FOR VIRTUAL VISIT FOR:  David Curtis  ?By participating in this virtual visit I agree to the following: ? ?I hereby voluntarily request, consent and authorize [facility] and its employed or contracted physicians, physician assistants, nurse practitioners or other licensed health care professionals (the Practitioner), to provide me with telemedicine health care services (the ?Services") as deemed necessary by the treating Practitioner. I acknowledge and consent to receive the Services by the Practitioner via telemedicine. I understand that the telemedicine visit will involve communicating with the Practitioner through live audiovisual communication technology and the disclosure of certain medical information by electronic transmission. I acknowledge that I have been given the opportunity to request an in-person assessment or other available alternative prior to the telemedicine visit and am voluntarily participating in the telemedicine visit. ? ?I understand that I have the right to withhold or withdraw my consent to the use of telemedicine in the course of my care at any time, without affecting my right to future care or treatment, and that the Practitioner or I may terminate the telemedicine visit at any time. I understand that I have the right to inspect all information obtained and/or recorded in the course of the telemedicine visit and may receive copies of available information for a reasonable fee.  I understand that some of the potential risks of receiving the Services via telemedicine include:  ?Delay or interruption in medical evaluation due to technological equipment failure or disruption; ?Information transmitted may not be sufficient (e.g. poor  resolution of images) to allow for appropriate medical decision making by the Practitioner; and/or  ?In rare instances, security protocols could fail, causing a breach of personal health information. ? ?Furthermore, I acknowledge that it is my responsibility to provide information about my medical history, conditions and care that is complete and accurate to the best of my ability. I acknowledge that Practitioner's advice, recommendations, and/or decision may be based on factors not within their control, such as incomplete or inaccurate data provided by me or distortions of diagnostic images or specimens that may result from electronic transmissions. I understand that the practice of medicine is not an exact science and that Practitioner makes no warranties or guarantees regarding treatment outcomes. I acknowledge that a copy of this consent can be made available to me via my patient portal ([facility]), or I can request a printed copy by calling the office of [facility].   ? ?I understand that my insurance will be billed for this visit.  ? ?I have read or had this consent read to me. ?I understand the contents of this consent, which adequately explains the benefits and risks of the Services being provided via telemedicine.  ?I have been provided ample opportunity to ask questions regarding this consent and the Services and have had my questions answered to my satisfaction. ?I give my informed consent for the services to be provided through the use of telemedicine in my medical care ? ? ? ?

## 2022-02-13 NOTE — Telephone Encounter (Signed)
Removing from PharmD box since patient is not on anticoagulant ?

## 2022-02-13 NOTE — Telephone Encounter (Signed)
His aspirin may be held for 5-7 days prior to his procedure.   ? ?Preoperative team, please contact this patient and set up a phone call appointment for further cardiac evaluation.  Thank you for your help. ? ?Jossie Ng. Jaimy Kliethermes NP-C ? ?  ?02/13/2022, 10:19 AM ?[place] [place] ?[place] [address] ?Office [phone] Fax [phone] ? ?

## 2022-02-21 ENCOUNTER — Ambulatory Visit (INDEPENDENT_AMBULATORY_CARE_PROVIDER_SITE_OTHER): Payer: Medicare Other | Admitting: General Practice

## 2022-02-21 DIAGNOSIS — Z01810 Encounter for preprocedural cardiovascular examination: Secondary | ICD-10-CM

## 2022-02-21 NOTE — Progress Notes (Signed)
? ?Virtual Visit via Telephone Note  ? ?This visit type was conducted due to national recommendations for restrictions regarding the COVID-19 Pandemic (e.g. social distancing) in an effort to limit this patient's exposure and mitigate transmission in our community.  Due to his co-morbid illnesses, this patient is at least at moderate risk for complications without adequate follow up.  This format is felt to be most appropriate for this patient at this time.  The patient did not have access to video technology/had technical difficulties with video requiring transitioning to audio format only (telephone).  All issues noted in this document were discussed and addressed.  No physical exam could be performed with this format.  Please refer to the patient's chart for his  consent to telehealth for David Curtis. ? ?Evaluation Performed:  Preoperative cardiovascular risk assessment ?_____________  ? ?Date:  02/21/2022  ? ?Patient ID:  David Curtis, DOB 04-25-1961, MRN [id number] ?Patient Location:  ?Home ?Provider location:   ?Office ? ?Primary Care Provider:  Arthur Holms, NP ?Primary Cardiologist:  David Furbish, MD ? ?Chief Complaint  ?  ?61 y.o. y/o male with a h/o essential hypertension, chest Curtis, leg Curtis bilateral, aortic atherosclerosis, tobacco use, who is pending right total hip arthroplasty, and presents today for telephonic preoperative cardiovascular risk assessment. ? ?Past Medical History  ?  ?Past Medical History:  ?Diagnosis Date  ? Alcohol dependence ([place])   ? Aortic atherosclerosis ([place])   ? Arthritis   ? CAD (coronary artery disease)   ? Cigarette nicotine dependence   ? Cocaine use   ? COPD (chronic obstructive pulmonary disease) ([place])   ? GERD (gastroesophageal reflux disease)   ? Hemorrhoids   ? Hypertension   ? Lipoma   ? Liver nodule   ? OA (osteoarthritis)   ? Overweight   ? Pancreatic cyst   ? Prediabetes   ? Pulmonary nodules   ? ?Past Surgical History:  ?Procedure Laterality Date  ? APPENDECTOMY   2011  ? COLONOSCOPY    ? Approx 2000 in [place], [place]; infectious colitis, no polyps/masses (per patient; records not available)  ? COLONOSCOPY WITH PROPOFOL N/A 08/09/2015  ? [id number] hemorrhoids  ? JOINT REPLACEMENT Right   ? ankle- has been broken 4x  ? POLYPECTOMY N/A 08/09/2015  ? Procedure: POLYPECTOMY;  Surgeon: David Dolin, MD;  Location: [facility];  Service: Endoscopy;  Laterality: N/A;  ? ? ?Allergies ? ?No Known Allergies ? ?History of Present Illness  ?  ?David Curtis is a 61 y.o. male who presents via audio/video conferencing for a telehealth visit today.  Pt was last seen in cardiology clinic on 09/18/2021 by Dr. Marlou Curtis.  At that time David Curtis was doing well .  The patient is now pending right total hip arthroplasty.  Since his last visit, he remains stable from a cardiac standpoint ? ?Today he denies chest Curtis, shortness of breath, lower extremity edema, fatigue, palpitations, melena, hematuria, hemoptysis, diaphoresis, weakness, presyncope, syncope, orthopnea, and PND. ? ? ?Home Medications  ?  ?Prior to Admission medications   ?Medication Sig Start Date End Date Taking? Authorizing Provider  ?acetaminophen (TYLENOL) 500 MG tablet Take 500 mg by mouth every 6 (six) hours as needed for mild Curtis or moderate Curtis.    [provider]  ?albuterol (PROVENTIL HFA;VENTOLIN HFA) 108 (90 Base) MCG/ACT inhaler Inhale 2 puffs into the lungs every 4 (four) hours as needed for wheezing or shortness of breath. 05/05/18   David Rossetti, MD  ?albuterol (PROVENTIL) (  2.5 MG/3ML) 0.083% nebulizer solution Take 3 mLs (2.5 mg total) by nebulization every 6 (six) hours as needed for wheezing or shortness of breath. 02/24/18   David Rossetti, MD  ?amoxicillin-clavulanate (AUGMENTIN) 875-125 MG tablet Take 1 tablet by mouth 2 (two) times daily. One po bid x 7 days ?Patient not taking: Reported on 02/13/2022 03/21/19   David Fraise, MD  ?aspirin EC 81 MG tablet Take 1 tablet (81 mg total) by mouth  daily. Swallow whole. 11/13/21   David Pain, MD  ?cholecalciferol (VITAMIN D3) 25 MCG (1000 UNIT) tablet Take 1,000 Units by mouth daily.    [provider]  ?diclofenac (VOLTAREN) 50 MG EC tablet Take 1 tablet (50 mg total) by mouth 2 (two) times daily. 05/21/18   David Murrain, NP  ?fluticasone furoate-vilanterol (BREO ELLIPTA) 200-25 MCG/INH AEPB Inhale 1 puff into the lungs daily. 09/16/18   Ward, Ozella Almond, PA-C  ?hydrochlorothiazide (MICROZIDE) 12.5 MG capsule Take 1 capsule (12.5 mg total) by mouth daily. 05/05/18   David Rossetti, MD  ?lidocaine (LIDODERM) 5 % Place 1 patch onto the skin daily. Remove & Discard patch within 12 hours or as directed by MD 05/21/18   David Murrain, NP  ?lisinopril (PRINIVIL,ZESTRIL) 40 MG tablet Take 1 tablet (40 mg total) by mouth daily. 05/05/18   Teague, Modena Nunnery, MD  ?methocarbamol (ROBAXIN) 500 MG tablet Take 1 tablet (500 mg total) by mouth 2 (two) times daily. 05/21/18   David Murrain, NP  ?metoprolol tartrate (LOPRESSOR) 100 MG tablet Take 1 tablet (100 mg total) by mouth once for 1 dose. 2 HOURS PRIOR TO PROCEDURE 09/18/21 09/18/21  David Pain, MD  ?promethazine (PHENERGAN) 25 MG tablet Take 1 tablet (25 mg total) by mouth every 6 (six) hours as needed for nausea or vomiting. 02/22/18   David Sheldon, PA-C  ?rosuvastatin (CRESTOR) 20 MG tablet Take 1 tablet (20 mg total) by mouth daily. 09/18/21 02/13/22  David Pain, MD  ? ? ?Physical Exam  ?  ?Vital Signs:  David Curtis does not have vital signs available for review today. ? ?Given telephonic nature of communication, physical exam is limited. ?AAOx3. NAD. Normal affect.  Speech and respirations are unlabored. ? ?Accessory Clinical Findings  ?  ?None ? ?Assessment & Plan  ?  ?1.  Preoperative Cardiovascular Risk Assessment: ? ?  ? ?Primary Cardiologist: David Furbish, MD ? ?Chart reviewed as part of pre-operative protocol coverage. Given past medical history and time since last visit, based on  ACC/AHA guidelines, David Curtis would be at acceptable risk for the planned procedure without further cardiovascular testing.  ? ?Patient was advised that if he develops new symptoms prior to surgery to contact our office to arrange a follow-up appointment.  He verbalized understanding. ? ?His aspirin may be held for 5-7 days prior to his procedure.  Please resume as soon as hemostasis is achieved. ? ?His RCRI is a class I risk, 0.4% risk of major cardiac event.  He is able to complete greater than 4 METS of physical activity ? ?A copy of this note will be routed to requesting surgeon. ? ?Time:   ?Today, I have spent 5  minutes with the patient with telehealth technology discussing medical history, symptoms, and management plan.  Prior to the phone evaluation I spent greater than 10 minutes reviewing the patient's past medical history and medications. ? ? ?Deberah Pelton, NP ? ?02/21/2022, 7:10 AM ? ?

## 2022-03-14 NOTE — Progress Notes (Addendum)
COVID Vaccine Completed: yes x4 ?Date COVID Vaccine completed: ?Has received booster: ?COVID vaccine manufacturer: [place]  ? ?Date of COVID positive in last 90 days: no ? ?PCP - Blima Ledger, NP ?Cardiologist - Candee Furbish, MD ? ?Cardiac clearance by Coletta Memos 02/21/22 Epic ? ?Chest x-ray - n/a ?EKG - 09/18/21 Epic ?Stress Test - years ago per pt ?ECHO - n/a ?Cardiac Cath - n/a ?Pacemaker/ICD device last checked: n/a ?Spinal Cord Stimulator: n/a ? ?Bowel Prep - no ? ?Sleep Study - yes, negative ?CPAP -  ? ?Fasting Blood Sugar - no per pt ?Checks Blood Sugar _____ times a day ? ?Blood Thinner Instructions: ?Aspirin Instructions: ASA 81, hold 7 days ?Last Dose: ? ?Activity level: Can perform activities of daily living without stopping and without symptoms of chest pain or shortness of breath. No stairs due to hip ?   ? ?Anesthesia review: HTN, COPD, CAD ? ?Patient denies shortness of breath, fever, cough and chest pain at PAT appointment ? ? ?Patient verbalized understanding of instructions that were given to them at the PAT appointment. Patient was also instructed that they will need to review over the PAT instructions again at home before surgery.  ?

## 2022-03-14 NOTE — Patient Instructions (Addendum)
DUE TO COVID-19 ONLY ONE VISITOR  (aged 61 and older)  IS ALLOWED TO COME WITH YOU AND STAY IN THE WAITING ROOM ONLY DURING PRE OP AND PROCEDURE.   ?**NO VISITORS ARE ALLOWED IN THE SHORT STAY AREA OR RECOVERY ROOM!!** ? ?IF YOU WILL BE ADMITTED INTO THE HOSPITAL YOU ARE ALLOWED ONLY TWO SUPPORT PEOPLE DURING VISITATION HOURS ONLY (7 AM -8PM)   ?The support person(s) must pass our screening, gel in and out, and wear a mask at all times, including in the patient?s room. ?Patients must also wear a mask when staff or their support person are in the room. ?Visitors GUEST BADGE MUST BE WORN VISIBLY  ?One adult visitor may remain with you overnight and MUST be in the room by 8 P.M. ?  ? ? Your procedure is scheduled on: 03/25/22 ? ? Report to [facility] Main Entrance ? ?  Report to admitting at 11:35 AM ? ? Call this number if you have problems the morning of surgery [phone] ? ? Do not eat food :After Midnight. ? ? After Midnight you may have the following liquids until 11:20 AM DAY OF SURGERY ? ?Water ?Black Coffee (sugar ok, NO MILK/CREAM OR CREAMERS)  ?Tea (sugar ok, NO MILK/CREAM OR CREAMERS) regular and decaf                             ?Plain Jell-O (NO RED)                                           ?Fruit ices (not with fruit pulp, NO RED)                                     ?Popsicles (NO RED)                                                                  ?Juice: apple, WHITE grape, WHITE cranberry ?Sports drinks like Gatorade (NO RED) ?Clear broth(vegetable,chicken,beef) ?  ?  ?The day of surgery:  ?Drink ONE (1) Pre-Surgery G2 at 11:20 AM the morning of surgery. Drink in one sitting. Do not sip.  ?This drink was given to you during your hospital  ?pre-op appointment visit. ?Nothing else to drink after completing the  ?Pre-Surgery G2. ?  ?       If you have questions, please contact your surgeon?s office. ? ? ?FOLLOW BOWEL PREP AND ANY ADDITIONAL PRE OP INSTRUCTIONS YOU RECEIVED FROM YOUR SURGEON'S  OFFICE!!! ?  ?  ?Oral Hygiene is also important to reduce your risk of infection.                                    ?Remember - BRUSH YOUR TEETH THE MORNING OF SURGERY WITH YOUR REGULAR TOOTHPASTE ? ? Do NOT smoke after Midnight ? ? Take these medicines the morning of surgery with A SIP OF WATER: Inhalers, Rosuvastatin ?                  ?  You may not have any metal on your body including jewelry, and body piercing ? ?           Do not wear lotions, powders, cologne, or deodorant ? ?            Men may shave face and neck. ? ? Do not bring valuables to the hospital. [facility] NOT ?            RESPONSIBLE   FOR VALUABLES. ? ? Bring small overnight bag day of surgery. ?  ? Special Instructions: Bring a copy of your healthcare power of attorney and living will documents         the day of surgery if you haven't scanned them before. ? ?            Please read over the following fact sheets you were given: IF [place] [phone]- Apolonio Schneiders  ? ?   [facility] - Preparing for Surgery ?Before surgery, you can play an important role.  Because skin is not sterile, your skin needs to be as free of germs as possible.  You can reduce the number of germs on your skin by washing with CHG (chlorahexidine gluconate) soap before surgery.  CHG is an antiseptic cleaner which kills germs and bonds with the skin to continue killing germs even after washing. ?Please DO NOT use if you have an allergy to CHG or antibacterial soaps.  If your skin becomes reddened/irritated stop using the CHG and inform your nurse when you arrive at Short Stay. ?Do not shave (including legs and underarms) for at least 48 hours prior to the first CHG shower.  You may shave your face/neck. ? ?Please follow these instructions carefully: ? 1.  Shower with CHG Soap the night before surgery and the  morning of surgery. ? 2.  If you choose to wash your hair, wash your hair first as usual with your  normal  shampoo. ? 3.  After you shampoo, rinse your hair and body thoroughly to remove the shampoo.                            ? 4.  Use CHG as you would any other liquid soap.  You can apply chg directly to the skin and wash.  Gently with a scrungie or clean washcloth. ? 5.  Apply the CHG Soap to your body ONLY FROM THE NECK DOWN.   Do   not use on face/ open      ?                     Wound or open sores. Avoid contact with eyes, ears mouth and   genitals (private parts).  ?                     Production manager,  Genitals (private parts) with your normal soap. ?            6.  Wash thoroughly, paying special attention to the area where your    surgery  will be performed. ? 7.  Thoroughly rinse your body with warm water from the neck down. ? 8.  DO NOT shower/wash with your normal soap after using and rinsing off the CHG Soap. ?               9.  Pat yourself dry  with a clean towel. ?           10.  Wear clean pajamas. ?           11.  Place clean sheets on your bed the night of your first shower and do not  sleep with pets. ?Day of Surgery : ?Do not apply any lotions/deodorants the morning of surgery.  Please wear clean clothes to the hospital/surgery center. ? ?FAILURE TO FOLLOW THESE INSTRUCTIONS MAY RESULT IN THE CANCELLATION OF YOUR SURGERY ? ?PATIENT SIGNATURE_________________________________ ? ?NURSE SIGNATURE__________________________________ ? ?________________________________________________________________________  ? ?Incentive Spirometer ? ?An incentive spirometer is a tool that can help keep your lungs clear and active. This tool measures how well you are filling your lungs with each breath. Taking long deep breaths may help reverse or decrease the chance of developing breathing (pulmonary) problems (especially infection) following: ?A long period of time when you are unable to move or be active. ?BEFORE THE PROCEDURE  ?If the spirometer includes an indicator to show your best effort, your nurse or  respiratory therapist will set it to a desired goal. ?If possible, sit up straight or lean slightly forward. Try not to slouch. ?Hold the incentive spirometer in an upright position. ?INSTRUCTIONS FOR USE  ?Sit on the edge of your bed if possible, or sit up as far as you can in bed or on a chair. ?Hold the incentive spirometer in an upright position. ?Breathe out normally. ?Place the mouthpiece in your mouth and seal your lips tightly around it. ?Breathe in slowly and as deeply as possible, raising the piston or the ball toward the top of the column. ?Hold your breath for 3-5 seconds or for as long as possible. Allow the piston or ball to fall to the bottom of the column. ?Remove the mouthpiece from your mouth and breathe out normally. ?Rest for a few seconds and repeat Steps 1 through 7 at least 10 times every 1-2 hours when you are awake. Take your time and take a few normal breaths between deep breaths. ?The spirometer may include an indicator to show your best effort. Use the indicator as a goal to work toward during each repetition. ?After each set of 10 deep breaths, practice coughing to be sure your lungs are clear. If you have an incision (the cut made at the time of surgery), support your incision when coughing by placing a pillow or rolled up towels firmly against it. ?Once you are able to get out of bed, walk around indoors and cough well. You may stop using the incentive spirometer when instructed by your caregiver.  ?RISKS AND COMPLICATIONS ?Take your time so you do not get dizzy or light-headed. ?If you are in pain, you may need to take or ask for pain medication before doing incentive spirometry. It is harder to take a deep breath if you are having pain. ?AFTER USE ?Rest and breathe slowly and easily. ?It can be helpful to keep track of a log of your progress. Your caregiver can provide you with a simple table to help with this. ?If you are using the spirometer at home, follow these  instructions: ?SEEK MEDICAL CARE IF:  ?You are having difficultly using the spirometer. ?You have trouble using the spirometer as often as instructed. ?Your pain medication is not giving enough relief while using the spirometer

## 2022-03-17 ENCOUNTER — Encounter (HOSPITAL_COMMUNITY)
Admission: RE | Admit: 2022-03-17 | Discharge: 2022-03-17 | Disposition: A | Discharge status: Home / Self Care | Payer: Medicare Other | Source: Ambulatory Visit | Attending: Orthopedic Surgery | Admitting: Orthopedic Surgery

## 2022-03-17 ENCOUNTER — Encounter (HOSPITAL_COMMUNITY): Payer: Self-pay

## 2022-03-17 VITALS — BP 144/86 | HR 75 | Temp 98.0°F | Resp 18 | Ht 68.0 in | Wt 195.0 lb

## 2022-03-17 DIAGNOSIS — F1010 Alcohol abuse, uncomplicated: Secondary | ICD-10-CM | POA: Diagnosis not present

## 2022-03-17 DIAGNOSIS — Z01818 Encounter for other preprocedural examination: Secondary | ICD-10-CM

## 2022-03-17 DIAGNOSIS — F17200 Nicotine dependence, unspecified, uncomplicated: Secondary | ICD-10-CM | POA: Diagnosis not present

## 2022-03-17 DIAGNOSIS — M1611 Unilateral primary osteoarthritis, right hip: Secondary | ICD-10-CM | POA: Insufficient documentation

## 2022-03-17 DIAGNOSIS — I10 Essential (primary) hypertension: Secondary | ICD-10-CM | POA: Diagnosis not present

## 2022-03-17 DIAGNOSIS — I2510 Atherosclerotic heart disease of native coronary artery without angina pectoris: Secondary | ICD-10-CM | POA: Insufficient documentation

## 2022-03-17 DIAGNOSIS — K219 Gastro-esophageal reflux disease without esophagitis: Secondary | ICD-10-CM | POA: Insufficient documentation

## 2022-03-17 DIAGNOSIS — R7303 Prediabetes: Secondary | ICD-10-CM | POA: Diagnosis not present

## 2022-03-17 DIAGNOSIS — Z01812 Encounter for preprocedural laboratory examination: Secondary | ICD-10-CM | POA: Diagnosis not present

## 2022-03-17 DIAGNOSIS — J449 Chronic obstructive pulmonary disease, unspecified: Secondary | ICD-10-CM | POA: Diagnosis not present

## 2022-03-17 HISTORY — DX: Pneumonia, unspecified organism: J18.9

## 2022-03-17 LAB — COMPREHENSIVE METABOLIC PANEL
ALT: 37 U/L (ref 0–44)
AST: 27 U/L (ref 15–41)
Albumin: 4.3 g/dL (ref 3.5–5.0)
Alkaline Phosphatase: 61 U/L (ref 38–126)
Anion gap: 10 (ref 5–15)
BUN: 13 mg/dL (ref 8–23)
CO2: 23 mmol/L (ref 22–32)
Calcium: 9.2 mg/dL (ref 8.9–10.3)
Chloride: 107 mmol/L (ref 98–111)
Creatinine, Ser: 0.74 mg/dL (ref 0.61–1.24)
GFR, Estimated: >60 mL/min (ref >60)
Glucose, Bld: 97 mg/dL (ref 70–99)
Potassium: 3.9 mmol/L (ref 3.5–5.1)
Sodium: 140 mmol/L (ref 135–145)
Total Bilirubin: 0.8 mg/dL (ref 0.3–1.2)
Total Protein: 7.6 g/dL (ref 6.5–8.1)

## 2022-03-17 LAB — HEMOGLOBIN A1C
Hgb A1c MFr Bld: 6.5 % — ABNORMAL HIGH (ref 4.8–5.6)
Mean Plasma Glucose: 139.85 mg/dL

## 2022-03-17 LAB — CBC
HCT: 43.5 % (ref 39.0–52.0)
Hemoglobin: 14.2 g/dL (ref 13.0–17.0)
MCH: 30.1 pg (ref 26.0–34.0)
MCHC: 32.6 g/dL (ref 30.0–36.0)
MCV: 92.4 fL (ref 80.0–100.0)
Platelets: 289 10*3/uL (ref 150–400)
RBC: 4.71 MIL/uL (ref 4.22–5.81)
RDW: 13.4 % (ref 11.5–15.5)
WBC: 6.8 10*3/uL (ref 4.0–10.5)
nRBC: 0 % (ref 0.0–0.2)

## 2022-03-17 LAB — SURGICAL PCR SCREEN
MRSA, PCR: NEGATIVE
Staphylococcus aureus: NEGATIVE

## 2022-03-18 NOTE — Progress Notes (Signed)
Anesthesia Chart Review ? ? Case: [id number] Date/Time: 03/25/22 1405  ? Procedure: TOTAL HIP ARTHROPLASTY ANTERIOR APPROACH (Right: Hip)  ? Anesthesia type: Spinal  ? Pre-op diagnosis: Right hip avascular necrosis  ? Location: [facility] ROOM 10 / [facility]  ? Surgeons: Paralee Cancel, MD  ? ?  ? ? ?DISCUSSION:61 y.o. smoker with h/o HTN, GERD, CAD, COPD, alcohol abuse, right hip AVN scheduled for above procedure 03/25/2022 with Dr. Paralee Cancel.  ? ?Pt last seen by cardiology 02/21/2022. Per OV note, "Chart reviewed as part of pre-operative protocol coverage. Given past medical history and time since last visit, based on ACC/AHA guidelines, Fredi Geiler would be at acceptable risk for the planned procedure without further cardiovascular testing.  ?  ?Patient was advised that if he develops new symptoms prior to surgery to contact our office to arrange a follow-up appointment.  He verbalized understanding. ?  ?His aspirin may be held for 5-7 days prior to his procedure.  Please resume as soon as hemostasis is achieved. ?  ?His RCRI is a class I risk, 0.4% risk of major cardiac event.  He is able to complete greater than 4 METS of physical activity" ? ?Anticipate pt can proceed with planned procedure barring acute status change.   ?VS: BP (!) 144/86   Pulse 75   Temp 36.7 ?C (Oral)   Resp 18   Ht '5\' 8"'$  (1.727 m)   Wt 88.5 kg   SpO2 100%   BMI 29.65 kg/m?  ? ?PROVIDERS: ?Arthur Holms, NP is PCP  ? ?Cardiologist - Candee Furbish, MD ? ?LABS: Labs reviewed: Acceptable for surgery. ?(all labs ordered are listed, but only abnormal results are displayed) ? ?Labs Reviewed  ?HEMOGLOBIN A1C - Abnormal; Notable for the following components:  ?    Result Value  ? Hgb A1c MFr Bld 6.5 (*)   ? All other components within normal limits  ?SURGICAL PCR SCREEN  ?CBC  ?COMPREHENSIVE METABOLIC PANEL  ?TYPE AND SCREEN  ? ? ? ?IMAGES: ? ? ?EKG: ?09/18/2021 ?Rate 76 bpm ?Sinus  Rhythm  ?-Left atrial enlargement.  ?Nonspecific ST-T wave  changes ? ?CV: ?CT Coronary 11/11/2021 ?FINDINGS: ?FFRct analysis was performed on the original cardiac CT angiogram ?dataset. Diagrammatic representation of the FFRct analysis is ?provided in a separate PDF document in PACS. This dictation was ?created using the PDF document and an interactive 3D model of the ?results. 3D model is not available in the EMR/PACS. Normal FFR range ?is >0.80. ?  ?1. Left Main:  No significant stenosis. FFR = 0.99 ?  ?2. LAD: No significant stenosis. Proximal FFR = 0.87, Mid FFR = ?0.82, Distal FFR = 0.77 (tapering) ?3. LCX: No significant stenosis. Proximal FFR = 0.95, Distal FFR = ?0.83 ?4. RCA: No significant stenosis. Proximal FFR = 0.99, Mid FFR = ?0.93, Distal FFR = 0.89 ?  ?IMPRESSION: ?1. CT FFR analysis demonstrated tapering flow limitation of the ?distal LAD, not likely clinically significant. ?  ?2.  No discrete significant flow-limiting stenoses. ?Past Medical History:  ?Diagnosis Date  ? Alcohol dependence ([place])   ? Aortic atherosclerosis ([place])   ? Arthritis   ? CAD (coronary artery disease)   ? Cigarette nicotine dependence   ? Cocaine use   ? COPD (chronic obstructive pulmonary disease) ([place])   ? GERD (gastroesophageal reflux disease)   ? Hemorrhoids   ? Hypertension   ? Lipoma   ? Liver nodule   ? OA (osteoarthritis)   ? Overweight   ?  Pancreatic cyst   ? Pneumonia   ? Prediabetes   ? Pulmonary nodules   ? ? ?Past Surgical History:  ?Procedure Laterality Date  ? APPENDECTOMY  2011  ? COLONOSCOPY    ? Approx 2000 in [place], [place]; infectious colitis, no polyps/masses (per patient; records not available)  ? COLONOSCOPY WITH PROPOFOL N/A 08/09/2015  ? [id number] hemorrhoids  ? JOINT REPLACEMENT Right   ? ankle- has been broken 4x  ? POLYPECTOMY N/A 08/09/2015  ? Procedure: POLYPECTOMY;  Surgeon: Daneil Dolin, MD;  Location: [facility];  Service: Endoscopy;  Laterality: N/A;  ? ? ?MEDICATIONS: ? albuterol (PROVENTIL HFA;VENTOLIN HFA) 108 (90 Base) MCG/ACT inhaler  ? albuterol  (PROVENTIL) (2.5 MG/3ML) 0.083% nebulizer solution  ? amoxicillin-clavulanate (AUGMENTIN) 875-125 MG tablet  ? aspirin EC 81 MG tablet  ? cholecalciferol (VITAMIN D3) 25 MCG (1000 UNIT) tablet  ? diclofenac (VOLTAREN) 50 MG EC tablet  ? fluticasone furoate-vilanterol (BREO ELLIPTA) 200-25 MCG/INH AEPB  ? hydrochlorothiazide (MICROZIDE) 12.5 MG capsule  ? lidocaine (LIDODERM) 5 %  ? lisinopril (PRINIVIL,ZESTRIL) 40 MG tablet  ? losartan (COZAAR) 50 MG tablet  ? methocarbamol (ROBAXIN) 500 MG tablet  ? metoprolol tartrate (LOPRESSOR) 100 MG tablet  ? promethazine (PHENERGAN) 25 MG tablet  ? rosuvastatin (CRESTOR) 20 MG tablet  ? ?No current facility-administered medications for this encounter.  ? ? ? ?Konrad Felix Ward, PA-C ?[facility] ?[phone] ? ? ? ? ? ?

## 2022-03-18 NOTE — Anesthesia Preprocedure Evaluation (Addendum)
Anesthesia Evaluation  ?Patient identified by MRN, date of birth, ID band ?Patient awake ? ? ? ?Reviewed: ?Allergy & Precautions, NPO status , Patient's Chart, lab work & pertinent test results ? ?Airway ?Mallampati: II ? ?TM Distance: >3 FB ?Neck ROM: Full ? ? ? Dental ?no notable dental hx. ? ?  ?Pulmonary ?COPD, Current Smoker and Patient abstained from smoking.,  ?  ?Pulmonary exam normal ?breath sounds clear to auscultation ? ? ? ? ? ? Cardiovascular ?hypertension, Pt. on medications ?+ CAD  ?Normal cardiovascular exam ?Rhythm:Regular Rate:Normal ? ? ?  ?Neuro/Psych ?negative neurological ROS ? negative psych ROS  ? GI/Hepatic ?Neg liver ROS, GERD  ,  ?Endo/Other  ?negative endocrine ROS ? Renal/GU ?negative Renal ROS  ?negative genitourinary ?  ?Musculoskeletal ? ?(+) Arthritis , Osteoarthritis,   ? Abdominal ?  ?Peds ?negative pediatric ROS ?(+)  Hematology ?negative hematology ROS ?(+)   ?Anesthesia Other Findings ? ? Reproductive/Obstetrics ?negative OB ROS ? ?  ? ? ? ? ? ? ? ? ? ? ? ? ? ?  ?  ? ? ? ? ? ? ?Anesthesia Physical ?Anesthesia Plan ? ?ASA: 3 ? ?Anesthesia Plan: Spinal  ? ?Post-op Pain Management: Regional block*  ? ?Induction: Intravenous ? ?PONV Risk Score and Plan: 0 and Treatment may vary due to age or medical condition and Propofol infusion ? ?Airway Management Planned: Simple Face Mask ? ?Additional Equipment:  ? ?Intra-op Plan:  ? ?Post-operative Plan:  ? ?Informed Consent: I have reviewed the patients History and Physical, chart, labs and discussed the procedure including the risks, benefits and alternatives for the proposed anesthesia with the patient or authorized representative who has indicated his/her understanding and acceptance.  ? ? ? ?Dental advisory given ? ?Plan Discussed with: CRNA ? ?Anesthesia Plan Comments: (See PAT note 03/17/2022, Konrad Felix Ward, PA-C)  ? ? ? ? ? ?Anesthesia Quick Evaluation ? ?

## 2022-03-20 ENCOUNTER — Emergency Department (HOSPITAL_COMMUNITY): Payer: Medicare Other

## 2022-03-20 ENCOUNTER — Emergency Department (HOSPITAL_COMMUNITY)
Admission: EM | Admit: 2022-03-20 | Discharge: 2022-03-20 | Disposition: A | Discharge status: Home / Self Care | Payer: Medicare Other | Attending: Emergency Medicine | Admitting: Emergency Medicine

## 2022-03-20 DIAGNOSIS — R1031 Right lower quadrant pain: Secondary | ICD-10-CM | POA: Diagnosis present

## 2022-03-20 DIAGNOSIS — M25551 Pain in right hip: Secondary | ICD-10-CM | POA: Diagnosis not present

## 2022-03-20 DIAGNOSIS — Z7982 Long term (current) use of aspirin: Secondary | ICD-10-CM | POA: Insufficient documentation

## 2022-03-20 MED ORDER — MORPHINE SULFATE (PF) 4 MG/ML IV SOLN
8.0000 mg | Freq: Once | INTRAVENOUS | Status: AC
Start: 2022-03-20 — End: 2022-03-20
  Administered 2022-03-20: 8 mg via INTRAMUSCULAR
  Filled 2022-03-20: qty 2

## 2022-03-20 MED ORDER — KETOROLAC TROMETHAMINE 60 MG/2ML IM SOLN
30.0000 mg | Freq: Once | INTRAMUSCULAR | Status: AC
  Administered 2022-03-20: 30 mg via INTRAMUSCULAR
  Filled 2022-03-20: qty 2

## 2022-03-20 NOTE — H&P (Signed)
TOTAL HIP ADMISSION H&P ? ?Patient is admitted for right total hip arthroplasty. ? ?Subjective: ? ?Chief Complaint: right hip pain ? ?HPI: David Curtis, 61 y.o. male, has a history of pain and functional disability in the right hip(s) due to  avascular necrosis  and patient has failed non-surgical conservative treatments for greater than 12 weeks to include NSAID's and/or analgesics and activity modification.  Onset of symptoms was gradual starting 1 years ago with gradually worsening course since that time.The patient noted no past surgery on the right hip(s).  Patient currently rates pain in the right hip at 8 out of 10 with activity. Patient has worsening of pain with activity and weight bearing, pain that interfers with activities of daily living, and pain with passive range of motion. Patient has evidence of  avascular necrosis  by imaging studies. This condition presents safety issues increasing the risk of falls. There is no current active infection. ? ?Patient Active Problem List  ? Diagnosis Date Noted  ? Chest pain of uncertain etiology 32/95/1884  ? Leg pain 09/18/2021  ? COPD (chronic obstructive pulmonary disease) ([place]) 09/22/2016  ? Atherosclerosis of aorta ([place]) 09/22/2016  ? History of colonic polyps   ? Hemorrhoid   ? Substance abuse ([place]) 06/20/2015  ? Gastritis 01/09/2015  ? Tobacco use disorder 01/09/2015  ? Heavy alcohol use 01/09/2015  ? Essential hypertension 01/09/2015  ? Chronic ankle pain 01/09/2015  ? Liver mass 01/02/2015  ? Pancreatic mass 01/02/2015  ? Rectal bleeding 01/02/2015  ? Fatigue 01/02/2015  ? Loss of weight 01/02/2015  ? Abdominal pain 01/02/2015  ? ?Past Medical History:  ?Diagnosis Date  ? Alcohol dependence ([place])   ? Aortic atherosclerosis ([place])   ? Arthritis   ? CAD (coronary artery disease)   ? Cigarette nicotine dependence   ? Cocaine use   ? COPD (chronic obstructive pulmonary disease) ([place])   ? GERD (gastroesophageal reflux disease)   ? Hemorrhoids   ? Hypertension    ? Lipoma   ? Liver nodule   ? OA (osteoarthritis)   ? Overweight   ? Pancreatic cyst   ? Pneumonia   ? Prediabetes   ? Pulmonary nodules   ?  ?Past Surgical History:  ?Procedure Laterality Date  ? APPENDECTOMY  2011  ? COLONOSCOPY    ? Approx 2000 in [place], [place]; infectious colitis, no polyps/masses (per patient; records not available)  ? COLONOSCOPY WITH PROPOFOL N/A 08/09/2015  ? ZYS:[id number] hemorrhoids  ? JOINT REPLACEMENT Right   ? ankle- has been broken 4x  ? POLYPECTOMY N/A 08/09/2015  ? Procedure: POLYPECTOMY;  Surgeon: Daneil Dolin, MD;  Location: [facility];  Service: Endoscopy;  Laterality: N/A;  ?  ?No current facility-administered medications for this encounter.  ? ?Current Outpatient Medications  ?Medication Sig Dispense Refill Last Dose  ? albuterol (PROVENTIL HFA;VENTOLIN HFA) 108 (90 Base) MCG/ACT inhaler Inhale 2 puffs into the lungs every 4 (four) hours as needed for wheezing or shortness of breath. 3 Inhaler 3   ? albuterol (PROVENTIL) (2.5 MG/3ML) 0.083% nebulizer solution Take 3 mLs (2.5 mg total) by nebulization every 6 (six) hours as needed for wheezing or shortness of breath. 150 mL 0   ? aspirin EC 81 MG tablet Take 1 tablet (81 mg total) by mouth daily. Swallow whole. 90 tablet 3   ? cholecalciferol (VITAMIN D3) 25 MCG (1000 UNIT) tablet Take 1,000 Units by mouth daily.     ? diclofenac (VOLTAREN) 50 MG EC tablet Take 1  tablet (50 mg total) by mouth 2 (two) times daily. 15 tablet 0   ? fluticasone furoate-vilanterol (BREO ELLIPTA) 200-25 MCG/INH AEPB Inhale 1 puff into the lungs daily. 1 each 0   ? hydrochlorothiazide (MICROZIDE) 12.5 MG capsule Take 1 capsule (12.5 mg total) by mouth daily. 90 capsule 1   ? losartan (COZAAR) 50 MG tablet Take 50 mg by mouth daily.     ? rosuvastatin (CRESTOR) 20 MG tablet Take 1 tablet (20 mg total) by mouth daily. 90 tablet 3   ? amoxicillin-clavulanate (AUGMENTIN) 875-125 MG tablet Take 1 tablet by mouth 2 (two) times daily. One po bid x 7 days (Patient  not taking: Reported on 02/13/2022) 14 tablet 0   ? lidocaine (LIDODERM) 5 % Place 1 patch onto the skin daily. Remove & Discard patch within 12 hours or as directed by MD (Patient not taking: Reported on 03/12/2022) 10 patch 0 Not Taking  ? lisinopril (PRINIVIL,ZESTRIL) 40 MG tablet Take 1 tablet (40 mg total) by mouth daily. (Patient not taking: Reported on 03/12/2022) 90 tablet 1 Not Taking  ? methocarbamol (ROBAXIN) 500 MG tablet Take 1 tablet (500 mg total) by mouth 2 (two) times daily. (Patient not taking: Reported on 03/12/2022) 20 tablet 0 Not Taking  ? metoprolol tartrate (LOPRESSOR) 100 MG tablet Take 1 tablet (100 mg total) by mouth once for 1 dose. 2 HOURS PRIOR TO PROCEDURE (Patient not taking: Reported on 03/12/2022) 1 tablet 0 Not Taking  ? promethazine (PHENERGAN) 25 MG tablet Take 1 tablet (25 mg total) by mouth every 6 (six) hours as needed for nausea or vomiting. (Patient not taking: Reported on 03/12/2022) 30 tablet 0 Not Taking  ? ?No Known Allergies  ?Social History  ? ?Tobacco Use  ? Smoking status: Every Day  ?  Packs/day: 3.00  ?  Years: 40.00  ?  Pack years: 120.00  ?  Types: Cigarettes  ? Smokeless tobacco: Former  ?Substance Use Topics  ? Alcohol use: Yes  ?  Alcohol/week: 12.0 standard drinks  ?  Types: 12 Cans of beer per week  ?  ?Family History  ?Problem Relation Age of Onset  ? Arthritis Mother   ? Cancer Mother   ?     unknown type  ? Diabetes Mother   ? Hypertension Mother   ? Arthritis Sister   ? Cancer Sister   ?     unknown type, possibly breast CA  ? COPD Brother   ? Arthritis Sister   ? Arthritis Sister   ? Arthritis Sister   ? Colon cancer Neg Hx   ? Pancreatic cancer Neg Hx   ?  ? ?Review of Systems ? ?Objective: ? ?Physical Exam ? ?Vital signs in last 24 hours: ?Temp:  [98 ?F (36.7 ?C)] 98 ?F (36.7 ?C) (04/27 1023) ?Pulse Rate:  [48-73] 49 (04/27 1230) ?Resp:  [16-18] 16 (04/27 1230) ?BP: (122-169)/(78-99) 145/78 (04/27 1230) ?SpO2:  [98 %-100 %] 98 % (04/27 1230) ?Weight:  [88.5  kg] 88.5 kg (04/27 1026) ? ?Labs: ? ? ?Estimated body mass index is 29.65 kg/m? as calculated from the following: ?  Height as of 03/20/22: '5\' 8"'$  (1.727 m). ?  Weight as of 03/20/22: 88.5 kg. ? ? ?Imaging Review ?I ordered an [facility] pelvis and lateral of the right hip in order to review with him the extent of the avascular necrosis. He has evidence of subchondral collapse and fragmentation of his femoral head already. There is associated arthritis changes with complete  loss of joint space as currently his left hip joint space is maintained without evidence of femoral head avascular necrosis. ? ? ? ? ? ?Assessment/Plan: ? ?Avascular necrosis, right hip(s) ? ?The patient history, physical examination, clinical judgement of the provider and imaging studies are consistent with avascular necrosis of the right hip(s) and total hip arthroplasty is deemed medically necessary. The treatment options including medical management, injection therapy, arthroscopy and arthroplasty were discussed at length. The risks and benefits of total hip arthroplasty were presented and reviewed. The risks due to aseptic loosening, infection, stiffness, dislocation/subluxation,  thromboembolic complications and other imponderables were discussed.  The patient acknowledged the explanation, agreed to proceed with the plan and consent was signed. Patient is being admitted for inpatient treatment for surgery, pain control, PT, OT, prophylactic antibiotics, VTE prophylaxis, progressive ambulation and ADL's and discharge planning.The patient is planning to be discharged  home. ? ? ?Therapy Plans: HEP ?Disposition: Home with friend ?Planned DVT Prophylaxis: aspirin '81mg'$  BID ?DME needed: none ?PCP: Dr. Maudie Mercury, ?Cardiologist: Dr. Marlou Porch, clearance received ?TXA: IV ?Allergies: NKDA ?Anesthesia Concerns: none ?BMI: 30.3 ?Last HgbA1c: Not diabetic ? ?Other: ?- Norco, robaxin, celebrex ?- Dealing with severe tooth pain currently - trying to get dental apt ASAP as I  advised that we avoid all dental work after surgery and we do not want risk of infection related to dental issues (tells me on 4/27 that this has resolved) ? ?- **Alcohol use - ask patient about usage

## 2022-03-20 NOTE — ED Triage Notes (Addendum)
Pt arrives via [facility] for R hip pain. Pt has chronic pain to area (diagnosis of osteoarthritis), and is scheduled for upcoming surgery (03/25/2022), but would like to be evaluated sooner. Pt also c/o R groin pain which is new. Denies GI symptoms. No recent injury or strain to area. Pt was ambulatory with assistance per EMS, normally uses a cane.  ? ?VSS with EMS.  ? ? ? ?

## 2022-03-20 NOTE — ED Provider Notes (Signed)
?[facility] DEPT ?Provider Note ? ? ?CSN: [id number] ?Arrival date & time: 03/20/22  1008 ? ?  ? ?History ? ?Chief Complaint  ?Patient presents with  ? Hip Pain  ? Groin Pain  ? ? ?Mako Pelfrey is a 61 y.o. male. ? ?61 year old male with history of right hip osteoarthritis presents with worsening pain times several weeks.  Patient is scheduled for surgery in 5 days.  Denies any new trauma.  No fever or chills.  Has had intermittent right inguinal pain that is worse with ambulation better with rest.  Denies any fever or chills.  No emesis noted.  Has been using Percocet at home without relief ? ? ?  ? ?Home Medications ?Prior to Admission medications   ?Medication Sig Start Date End Date Taking? Authorizing Provider  ?albuterol (PROVENTIL HFA;VENTOLIN HFA) 108 (90 Base) MCG/ACT inhaler Inhale 2 puffs into the lungs every 4 (four) hours as needed for wheezing or shortness of breath. 05/05/18   Alycia Rossetti, MD  ?albuterol (PROVENTIL) (2.5 MG/3ML) 0.083% nebulizer solution Take 3 mLs (2.5 mg total) by nebulization every 6 (six) hours as needed for wheezing or shortness of breath. 02/24/18   Alycia Rossetti, MD  ?amoxicillin-clavulanate (AUGMENTIN) 875-125 MG tablet Take 1 tablet by mouth 2 (two) times daily. One po bid x 7 days ?Patient not taking: Reported on 02/13/2022 03/21/19   Ripley Fraise, MD  ?aspirin EC 81 MG tablet Take 1 tablet (81 mg total) by mouth daily. Swallow whole. 11/13/21   Jerline Pain, MD  ?cholecalciferol (VITAMIN D3) 25 MCG (1000 UNIT) tablet Take 1,000 Units by mouth daily.    [provider]  ?diclofenac (VOLTAREN) 50 MG EC tablet Take 1 tablet (50 mg total) by mouth 2 (two) times daily. 05/21/18   Ashley Murrain, NP  ?fluticasone furoate-vilanterol (BREO ELLIPTA) 200-25 MCG/INH AEPB Inhale 1 puff into the lungs daily. 09/16/18   Ward, Ozella Almond, PA-C  ?hydrochlorothiazide (MICROZIDE) 12.5 MG capsule Take 1 capsule (12.5 mg total) by mouth daily.  05/05/18   Alycia Rossetti, MD  ?lidocaine (LIDODERM) 5 % Place 1 patch onto the skin daily. Remove & Discard patch within 12 hours or as directed by MD ?Patient not taking: Reported on 03/12/2022 05/21/18   Ashley Murrain, NP  ?lisinopril (PRINIVIL,ZESTRIL) 40 MG tablet Take 1 tablet (40 mg total) by mouth daily. ?Patient not taking: Reported on 03/12/2022 05/05/18   Alycia Rossetti, MD  ?losartan (COZAAR) 50 MG tablet Take 50 mg by mouth daily.    [provider]  ?methocarbamol (ROBAXIN) 500 MG tablet Take 1 tablet (500 mg total) by mouth 2 (two) times daily. ?Patient not taking: Reported on 03/12/2022 05/21/18   Ashley Murrain, NP  ?metoprolol tartrate (LOPRESSOR) 100 MG tablet Take 1 tablet (100 mg total) by mouth once for 1 dose. 2 HOURS PRIOR TO PROCEDURE ?Patient not taking: Reported on 03/12/2022 09/18/21 09/18/21  Jerline Pain, MD  ?promethazine (PHENERGAN) 25 MG tablet Take 1 tablet (25 mg total) by mouth every 6 (six) hours as needed for nausea or vomiting. ?Patient not taking: Reported on 03/12/2022 02/22/18   Dena Billet B, PA-C  ?rosuvastatin (CRESTOR) 20 MG tablet Take 1 tablet (20 mg total) by mouth daily. 09/18/21 03/12/22  Jerline Pain, MD  ?   ? ?Allergies    ?Patient has no known allergies.   ? ?Review of Systems   ?Review of Systems  ?All other systems reviewed and are  negative. ? ?Physical Exam ?Updated Vital Signs ?BP (!) 162/83 (BP Location: Left Arm)   Pulse 73   Temp 98 ?F (36.7 ?C) (Oral)   Resp 18   Ht 1.727 m ('5\' 8"'$ )   Wt 88.5 kg   SpO2 100%   BMI 29.65 kg/m?  ?Physical Exam ?Vitals and nursing note reviewed.  ?Constitutional:   ?   General: He is not in acute distress. ?   Appearance: Normal appearance. He is well-developed. He is not toxic-appearing.  ?HENT:  ?   Head: Normocephalic and atraumatic.  ?Eyes:  ?   General: Lids are normal.  ?   Conjunctiva/sclera: Conjunctivae normal.  ?   Pupils: Pupils are equal, round, and reactive to light.  ?Neck:  ?   Thyroid: No thyroid  mass.  ?   Trachea: No tracheal deviation.  ?Cardiovascular:  ?   Rate and Rhythm: Normal rate and regular rhythm.  ?   Heart sounds: Normal heart sounds. No murmur heard. ?  No gallop.  ?Pulmonary:  ?   Effort: Pulmonary effort is normal. No respiratory distress.  ?   Breath sounds: Normal breath sounds. No stridor. No decreased breath sounds, wheezing, rhonchi or rales.  ?Abdominal:  ?   General: There is no distension.  ?   Palpations: Abdomen is soft.  ?   Tenderness: There is no abdominal tenderness. There is no rebound.  ?Genitourinary: ?   Penis: Circumcised.   ?   Comments: No palpable hernias appreciated right inguinal canal. ?Musculoskeletal:     ?   General: No tenderness. Normal range of motion.  ?   Cervical back: Normal range of motion and neck supple.  ?   Right hip: No bony tenderness. Normal range of motion.  ?   Comments: Pain at right hip with internal and external rotation.  ?Skin: ?   General: Skin is warm and dry.  ?   Findings: No abrasion or rash.  ?Neurological:  ?   Mental Status: He is alert and oriented to person, place, and time. Mental status is at baseline.  ?   GCS: GCS eye subscore is 4. GCS verbal subscore is 5. GCS motor subscore is 6.  ?   Cranial Nerves: No cranial nerve deficit.  ?   Sensory: No sensory deficit.  ?   Motor: Motor function is intact.  ?Psychiatric:     ?   Attention and Perception: Attention normal.     ?   Speech: Speech normal.     ?   Behavior: Behavior normal.  ? ? ?ED Results / Procedures / Treatments   ?Labs ?(all labs ordered are listed, but only abnormal results are displayed) ?Labs Reviewed - No data to display ? ?EKG ?None ? ?Radiology ?No results found. ? ?Procedures ?Procedures  ? ? ?Medications Ordered in ED ?Medications  ?morphine (PF) 4 MG/ML injection 8 mg (has no administration in time range)  ?ketorolac (TORADOL) injection 30 mg (has no administration in time range)  ? ? ?ED Course/ Medical Decision Making/ A&P ?  ?                         ?Medical Decision Making ?Risk ?Prescription drug management. ? ? ?Patient medicated for hip pain here and feels much better.  X-ray of his right hip per my interpretation shows no acute findings.  Patient will follow-up with his orthopedist. ? ? ? ? ? ? ? ?Final Clinical Impression(s) /  ED Diagnoses ?Final diagnoses:  ?None  ? ? ?Rx / DC Orders ?ED Discharge Orders   ? ? None  ? ?  ? ? ?  ?Lacretia Leigh, MD ?03/20/22 1210 ? ?

## 2022-03-25 ENCOUNTER — Ambulatory Visit (HOSPITAL_BASED_OUTPATIENT_CLINIC_OR_DEPARTMENT_OTHER): Payer: Medicare Other | Admitting: Anesthesiology

## 2022-03-25 ENCOUNTER — Observation Stay (HOSPITAL_COMMUNITY): Payer: Medicare Other

## 2022-03-25 ENCOUNTER — Encounter (HOSPITAL_COMMUNITY): Payer: Self-pay | Admitting: Orthopedic Surgery

## 2022-03-25 ENCOUNTER — Ambulatory Visit (HOSPITAL_COMMUNITY): Payer: Medicare Other | Admitting: Physician Assistant

## 2022-03-25 ENCOUNTER — Observation Stay (HOSPITAL_COMMUNITY)
Admission: RE | Admit: 2022-03-25 | Discharge: 2022-03-26 | Disposition: A | Discharge status: Home / Self Care | Payer: Medicare Other | Attending: Orthopedic Surgery | Admitting: Orthopedic Surgery

## 2022-03-25 ENCOUNTER — Other Ambulatory Visit: Payer: Self-pay

## 2022-03-25 ENCOUNTER — Ambulatory Visit (HOSPITAL_COMMUNITY): Payer: Medicare Other

## 2022-03-25 ENCOUNTER — Encounter (HOSPITAL_COMMUNITY)
Admission: RE | Disposition: A | Discharge status: Home / Self Care | Payer: Self-pay | Source: Home / Self Care | Attending: Orthopedic Surgery

## 2022-03-25 DIAGNOSIS — I2510 Atherosclerotic heart disease of native coronary artery without angina pectoris: Secondary | ICD-10-CM | POA: Diagnosis not present

## 2022-03-25 DIAGNOSIS — R7303 Prediabetes: Secondary | ICD-10-CM | POA: Insufficient documentation

## 2022-03-25 DIAGNOSIS — Z96661 Presence of right artificial ankle joint: Secondary | ICD-10-CM | POA: Diagnosis not present

## 2022-03-25 DIAGNOSIS — I10 Essential (primary) hypertension: Secondary | ICD-10-CM | POA: Insufficient documentation

## 2022-03-25 DIAGNOSIS — J449 Chronic obstructive pulmonary disease, unspecified: Secondary | ICD-10-CM

## 2022-03-25 DIAGNOSIS — F17210 Nicotine dependence, cigarettes, uncomplicated: Secondary | ICD-10-CM | POA: Insufficient documentation

## 2022-03-25 DIAGNOSIS — M1611 Unilateral primary osteoarthritis, right hip: Secondary | ICD-10-CM

## 2022-03-25 DIAGNOSIS — Z79899 Other long term (current) drug therapy: Secondary | ICD-10-CM | POA: Diagnosis not present

## 2022-03-25 DIAGNOSIS — Z96641 Presence of right artificial hip joint: Secondary | ICD-10-CM

## 2022-03-25 DIAGNOSIS — Z7982 Long term (current) use of aspirin: Secondary | ICD-10-CM | POA: Diagnosis not present

## 2022-03-25 HISTORY — PX: TOTAL HIP ARTHROPLASTY: SHX124

## 2022-03-25 LAB — TYPE AND SCREEN
ABO/RH(D): O POS
Antibody Screen: NEGATIVE

## 2022-03-25 SURGERY — ARTHROPLASTY, HIP, TOTAL, ANTERIOR APPROACH
Anesthesia: Spinal | Site: Hip | Laterality: Right

## 2022-03-25 MED ORDER — DEXAMETHASONE SODIUM PHOSPHATE 10 MG/ML IJ SOLN
8.0000 mg | Freq: Once | INTRAMUSCULAR | Status: AC
  Administered 2022-03-25: 10 mg via INTRAVENOUS

## 2022-03-25 MED ORDER — PHENOL 1.4 % MT LIQD: 1.0000 | OROMUCOSAL | Status: DC | PRN

## 2022-03-25 MED ORDER — MENTHOL 3 MG MT LOZG: 1.0000 | LOZENGE | OROMUCOSAL | Status: DC | PRN

## 2022-03-25 MED ORDER — DOCUSATE SODIUM 100 MG PO CAPS
100.0000 mg | ORAL_CAPSULE | Freq: Two times a day (BID) | ORAL | Status: DC
  Administered 2022-03-25 – 2022-03-26 (×2): 100 mg via ORAL
  Filled 2022-03-25 (×2): qty 1

## 2022-03-25 MED ORDER — EPHEDRINE SULFATE (PRESSORS) 50 MG/ML IJ SOLN
INTRAMUSCULAR | Status: DC | PRN
  Administered 2022-03-25 (×3): 5 mg via INTRAVENOUS

## 2022-03-25 MED ORDER — HYDROCHLOROTHIAZIDE 12.5 MG PO TABS
12.5000 mg | ORAL_TABLET | Freq: Every day | ORAL | Status: DC
  Administered 2022-03-26: 12.5 mg via ORAL
  Filled 2022-03-25: qty 1

## 2022-03-25 MED ORDER — ONDANSETRON HCL 4 MG/2ML IJ SOLN: 4.0000 mg | Freq: Four times a day (QID) | INTRAMUSCULAR | Status: DC | PRN

## 2022-03-25 MED ORDER — FENTANYL CITRATE (PF) 100 MCG/2ML IJ SOLN
INTRAMUSCULAR | Status: DC | PRN
  Administered 2022-03-25: 100 ug via INTRAVENOUS

## 2022-03-25 MED ORDER — FERROUS SULFATE 325 (65 FE) MG PO TABS
325.0000 mg | ORAL_TABLET | Freq: Three times a day (TID) | ORAL | Status: DC
  Administered 2022-03-26: 325 mg via ORAL
  Filled 2022-03-25: qty 1

## 2022-03-25 MED ORDER — CEFAZOLIN SODIUM-DEXTROSE 2-4 GM/100ML-% IV SOLN
2.0000 g | Freq: Four times a day (QID) | INTRAVENOUS | Status: AC
  Administered 2022-03-25 – 2022-03-26 (×2): 2 g via INTRAVENOUS
  Filled 2022-03-25 (×2): qty 100

## 2022-03-25 MED ORDER — METHOCARBAMOL 500 MG IVPB - SIMPLE MED
500.0000 mg | Freq: Four times a day (QID) | INTRAVENOUS | Status: DC | PRN
  Filled 2022-03-25: qty 50

## 2022-03-25 MED ORDER — MORPHINE SULFATE (PF) 2 MG/ML IV SOLN
0.5000 mg | INTRAVENOUS | Status: DC | PRN
  Administered 2022-03-25 (×2): 1 mg via INTRAVENOUS
  Filled 2022-03-25 (×2): qty 1

## 2022-03-25 MED ORDER — SPIRITUS FRUMENTI
1.0000 | Freq: Every day | ORAL | Status: DC
  Administered 2022-03-26: 1 via ORAL
  Filled 2022-03-25 (×2): qty 1

## 2022-03-25 MED ORDER — EPHEDRINE 5 MG/ML INJ
INTRAVENOUS | Status: AC
  Filled 2022-03-25: qty 5

## 2022-03-25 MED ORDER — OXYCODONE HCL 5 MG/5ML PO SOLN: 5.0000 mg | Freq: Once | ORAL | Status: DC | PRN

## 2022-03-25 MED ORDER — METHOCARBAMOL 500 MG PO TABS
500.0000 mg | ORAL_TABLET | Freq: Four times a day (QID) | ORAL | Status: DC | PRN
  Administered 2022-03-25 – 2022-03-26 (×3): 500 mg via ORAL
  Filled 2022-03-25 (×3): qty 1

## 2022-03-25 MED ORDER — FLUTICASONE FUROATE-VILANTEROL 200-25 MCG/INH IN AEPB: 1.0000 | INHALATION_SPRAY | Freq: Every day | RESPIRATORY_TRACT | Status: DC

## 2022-03-25 MED ORDER — ROSUVASTATIN CALCIUM 20 MG PO TABS
20.0000 mg | ORAL_TABLET | Freq: Every day | ORAL | Status: DC
  Administered 2022-03-25: 20 mg via ORAL
  Filled 2022-03-25: qty 1

## 2022-03-25 MED ORDER — CEFAZOLIN SODIUM-DEXTROSE 2-4 GM/100ML-% IV SOLN
2.0000 g | INTRAVENOUS | Status: AC
  Administered 2022-03-25: 2 g via INTRAVENOUS
  Filled 2022-03-25: qty 100

## 2022-03-25 MED ORDER — ONDANSETRON HCL 4 MG/2ML IJ SOLN
INTRAMUSCULAR | Status: AC
  Filled 2022-03-25: qty 2

## 2022-03-25 MED ORDER — HYDROCODONE-ACETAMINOPHEN 5-325 MG PO TABS: 1.0000 | ORAL_TABLET | ORAL | Status: DC | PRN

## 2022-03-25 MED ORDER — ASPIRIN 81 MG PO CHEW
81.0000 mg | CHEWABLE_TABLET | Freq: Two times a day (BID) | ORAL | Status: DC
  Administered 2022-03-25 – 2022-03-26 (×2): 81 mg via ORAL
  Filled 2022-03-25 (×2): qty 1

## 2022-03-25 MED ORDER — HYDROCODONE-ACETAMINOPHEN 7.5-325 MG PO TABS
1.0000 | ORAL_TABLET | ORAL | Status: DC | PRN
  Administered 2022-03-25 – 2022-03-26 (×5): 2 via ORAL
  Filled 2022-03-25 (×5): qty 2

## 2022-03-25 MED ORDER — TRANEXAMIC ACID-NACL 1000-0.7 MG/100ML-% IV SOLN
1000.0000 mg | INTRAVENOUS | Status: AC
  Administered 2022-03-25: 1000 mg via INTRAVENOUS
  Filled 2022-03-25: qty 100

## 2022-03-25 MED ORDER — TRANEXAMIC ACID-NACL 1000-0.7 MG/100ML-% IV SOLN
1000.0000 mg | Freq: Once | INTRAVENOUS | Status: AC
  Administered 2022-03-25: 1000 mg via INTRAVENOUS
  Filled 2022-03-25: qty 100

## 2022-03-25 MED ORDER — METOCLOPRAMIDE HCL 5 MG PO TABS: 5.0000 mg | ORAL_TABLET | Freq: Three times a day (TID) | ORAL | Status: DC | PRN

## 2022-03-25 MED ORDER — OXYCODONE HCL 5 MG PO TABS: 5.0000 mg | ORAL_TABLET | Freq: Once | ORAL | Status: DC | PRN

## 2022-03-25 MED ORDER — LACTATED RINGERS IV SOLN: INTRAVENOUS | Status: DC

## 2022-03-25 MED ORDER — BISACODYL 10 MG RE SUPP: 10.0000 mg | Freq: Every day | RECTAL | Status: DC | PRN

## 2022-03-25 MED ORDER — SODIUM CHLORIDE 0.9 % IR SOLN
Status: DC | PRN
  Administered 2022-03-25: 1000 mL

## 2022-03-25 MED ORDER — DIPHENHYDRAMINE HCL 12.5 MG/5ML PO ELIX: 12.5000 mg | ORAL_SOLUTION | ORAL | Status: DC | PRN

## 2022-03-25 MED ORDER — STERILE WATER FOR IRRIGATION IR SOLN
Status: DC | PRN
  Administered 2022-03-25: 2000 mL

## 2022-03-25 MED ORDER — METOCLOPRAMIDE HCL 5 MG/ML IJ SOLN: 5.0000 mg | Freq: Three times a day (TID) | INTRAMUSCULAR | Status: DC | PRN

## 2022-03-25 MED ORDER — LOSARTAN POTASSIUM 50 MG PO TABS
50.0000 mg | ORAL_TABLET | Freq: Every day | ORAL | Status: DC
  Administered 2022-03-26: 50 mg via ORAL
  Filled 2022-03-25: qty 1

## 2022-03-25 MED ORDER — ONDANSETRON HCL 4 MG PO TABS: 4.0000 mg | ORAL_TABLET | Freq: Four times a day (QID) | ORAL | Status: DC | PRN

## 2022-03-25 MED ORDER — POVIDONE-IODINE 10 % EX SWAB
2.0000 "application " | Freq: Once | CUTANEOUS | Status: AC
  Administered 2022-03-25: 2 via TOPICAL

## 2022-03-25 MED ORDER — DEXAMETHASONE SODIUM PHOSPHATE 10 MG/ML IJ SOLN
10.0000 mg | Freq: Once | INTRAMUSCULAR | Status: AC
  Administered 2022-03-26: 10 mg via INTRAVENOUS
  Filled 2022-03-25: qty 1

## 2022-03-25 MED ORDER — MIDAZOLAM HCL 5 MG/5ML IJ SOLN
INTRAMUSCULAR | Status: DC | PRN
  Administered 2022-03-25: 2 mg via INTRAVENOUS

## 2022-03-25 MED ORDER — CHLORHEXIDINE GLUCONATE 0.12 % MT SOLN
15.0000 mL | Freq: Once | OROMUCOSAL | Status: AC
  Administered 2022-03-25: 15 mL via OROMUCOSAL

## 2022-03-25 MED ORDER — PROMETHAZINE HCL 25 MG/ML IJ SOLN: 6.2500 mg | INTRAMUSCULAR | Status: DC | PRN

## 2022-03-25 MED ORDER — ORAL CARE MOUTH RINSE: 15.0000 mL | Freq: Once | OROMUCOSAL | Status: AC

## 2022-03-25 MED ORDER — CELECOXIB 200 MG PO CAPS
200.0000 mg | ORAL_CAPSULE | Freq: Two times a day (BID) | ORAL | Status: DC
  Administered 2022-03-25 – 2022-03-26 (×2): 200 mg via ORAL
  Filled 2022-03-25 (×2): qty 1

## 2022-03-25 MED ORDER — PROPOFOL 500 MG/50ML IV EMUL
INTRAVENOUS | Status: DC | PRN
  Administered 2022-03-25: 75 ug/kg/min via INTRAVENOUS

## 2022-03-25 MED ORDER — MIDAZOLAM HCL 2 MG/2ML IJ SOLN
INTRAMUSCULAR | Status: AC
  Filled 2022-03-25: qty 2

## 2022-03-25 MED ORDER — FLUTICASONE FUROATE-VILANTEROL 200-25 MCG/ACT IN AEPB
1.0000 | INHALATION_SPRAY | Freq: Every day | RESPIRATORY_TRACT | Status: DC
  Filled 2022-03-25: qty 28

## 2022-03-25 MED ORDER — DEXAMETHASONE SODIUM PHOSPHATE 10 MG/ML IJ SOLN
INTRAMUSCULAR | Status: AC
Start: 2022-03-25 — End: ?
  Filled 2022-03-25: qty 1

## 2022-03-25 MED ORDER — ALBUTEROL SULFATE (2.5 MG/3ML) 0.083% IN NEBU: 2.5000 mg | INHALATION_SOLUTION | Freq: Four times a day (QID) | RESPIRATORY_TRACT | Status: DC | PRN

## 2022-03-25 MED ORDER — FENTANYL CITRATE (PF) 100 MCG/2ML IJ SOLN
INTRAMUSCULAR | Status: AC
  Filled 2022-03-25: qty 2

## 2022-03-25 MED ORDER — ACETAMINOPHEN 325 MG PO TABS: 325.0000 mg | ORAL_TABLET | Freq: Four times a day (QID) | ORAL | Status: DC | PRN

## 2022-03-25 MED ORDER — POLYETHYLENE GLYCOL 3350 17 G PO PACK: 17.0000 g | PACK | Freq: Every day | ORAL | Status: DC | PRN

## 2022-03-25 MED ORDER — ONDANSETRON HCL 4 MG/2ML IJ SOLN
INTRAMUSCULAR | Status: DC | PRN
  Administered 2022-03-25: 4 mg via INTRAVENOUS

## 2022-03-25 MED ORDER — SODIUM CHLORIDE 0.9 % IV SOLN: INTRAVENOUS | Status: DC

## 2022-03-25 MED ORDER — HYDROMORPHONE HCL 1 MG/ML IJ SOLN: 0.2500 mg | INTRAMUSCULAR | Status: DC | PRN

## 2022-03-25 MED ORDER — ALBUTEROL SULFATE HFA 108 (90 BASE) MCG/ACT IN AERS: 2.0000 | INHALATION_SPRAY | RESPIRATORY_TRACT | Status: DC | PRN

## 2022-03-25 SURGICAL SUPPLY — 43 items
BAG COUNTER SPONGE SURGICOUNT (BAG) IMPLANT
BAG DECANTER FOR FLEXI CONT (MISCELLANEOUS) IMPLANT
BAG ZIPLOCK 12X15 (MISCELLANEOUS) IMPLANT
BLADE SAG 18X100X1.27 (BLADE) ×2 IMPLANT
COVER PERINEAL POST (MISCELLANEOUS) ×2 IMPLANT
COVER SURGICAL LIGHT HANDLE (MISCELLANEOUS) ×2 IMPLANT
CUP ACET PINNACLE SECTR 56MM (Hips) IMPLANT
DERMABOND ADVANCED (GAUZE/BANDAGES/DRESSINGS) ×1
DERMABOND ADVANCED .7 DNX12 (GAUZE/BANDAGES/DRESSINGS) ×1 IMPLANT
DRAPE FOOT SWITCH (DRAPES) ×2 IMPLANT
DRAPE STERI IOBAN 125X83 (DRAPES) ×2 IMPLANT
DRAPE U-SHAPE 47X51 STRL (DRAPES) ×4 IMPLANT
DRESSING AQUACEL AG SP 3.5X10 (GAUZE/BANDAGES/DRESSINGS) ×1 IMPLANT
DRSG AQUACEL AG SP 3.5X10 (GAUZE/BANDAGES/DRESSINGS) ×2
DURAPREP 26ML APPLICATOR (WOUND CARE) ×2 IMPLANT
ELECT REM PT RETURN 15FT ADLT (MISCELLANEOUS) ×2 IMPLANT
ELIMINATOR HOLE APEX DEPUY (Hips) ×1 IMPLANT
GLOVE BIO SURGEON STRL SZ 6 (GLOVE) ×4 IMPLANT
GLOVE BIO SURGEON STRL SZ7.5 (GLOVE) ×2 IMPLANT
GLOVE BIOGEL PI IND STRL 6.5 (GLOVE) ×1 IMPLANT
GLOVE BIOGEL PI IND STRL 7.5 (GLOVE) ×1 IMPLANT
GLOVE BIOGEL PI INDICATOR 6.5 (GLOVE) ×1
GLOVE BIOGEL PI INDICATOR 7.5 (GLOVE) ×1
GOWN STRL REUS W/ TWL LRG LVL3 (GOWN DISPOSABLE) ×2 IMPLANT
GOWN STRL REUS W/TWL LRG LVL3 (GOWN DISPOSABLE) ×2
HEAD CERAMIC 36 PLUS5 (Hips) ×1 IMPLANT
HOLDER FOLEY CATH W/STRAP (MISCELLANEOUS) ×2 IMPLANT
KIT TURNOVER KIT A (KITS) IMPLANT
PACK ANTERIOR HIP CUSTOM (KITS) ×2 IMPLANT
PINNACLE ALTRX PLUS 4 N 36X56 (Hips) ×1 IMPLANT
PINNACLE SECTOR CUP 56MM (Hips) ×2 IMPLANT
SCREW 6.5MMX30MM (Screw) ×1 IMPLANT
SPONGE T-LAP 18X18 ~~LOC~~+RFID (SPONGE) ×6 IMPLANT
STEM FEMORAL SZ9 HIGH ACTIS (Stem) ×1 IMPLANT
SUT MNCRL AB 4-0 PS2 18 (SUTURE) ×2 IMPLANT
SUT STRATAFIX 0 PDS 27 VIOLET (SUTURE) ×2
SUT VIC AB 1 CT1 36 (SUTURE) ×6 IMPLANT
SUT VIC AB 2-0 CT1 27 (SUTURE) ×2
SUT VIC AB 2-0 CT1 TAPERPNT 27 (SUTURE) ×2 IMPLANT
SUTURE STRATFX 0 PDS 27 VIOLET (SUTURE) ×1 IMPLANT
TRAY FOLEY MTR SLVR 16FR STAT (SET/KITS/TRAYS/PACK) IMPLANT
TUBE SUCTION HIGH CAP CLEAR NV (SUCTIONS) ×2 IMPLANT
WATER STERILE IRR 1000ML POUR (IV SOLUTION) ×2 IMPLANT

## 2022-03-25 NOTE — Discharge Instructions (Signed)

## 2022-03-25 NOTE — Plan of Care (Signed)

## 2022-03-25 NOTE — Progress Notes (Signed)
Pt has arrived to 1322 from PACU s/p right [place].  Report accepted from [place].  Pt is alert and oriented.  Rating pain at a 9.  Ice pack in place. Room orientation completed with call bell placed at bedside.  Will continue to monitor pt. ?

## 2022-03-25 NOTE — Transfer of Care (Signed)
Immediate Anesthesia Transfer of Care Note ? ?Patient: David Curtis ? ?Procedure(s) Performed: TOTAL HIP ARTHROPLASTY ANTERIOR APPROACH (Right: Hip) ? ?Patient Location: PACU ? ?Anesthesia Type:Spinal ? ?Level of Consciousness: awake, alert  and patient cooperative ? ?Airway & Oxygen Therapy: Patient Spontanous Breathing and Patient connected to face mask oxygen ? ?Post-op Assessment: Report given to RN and Post -op Vital signs reviewed and stable ? ?Post vital signs: Reviewed and stable ? ?Last Vitals:  ?Vitals Value Taken Time  ?BP 104/65 03/25/22 1553  ?Temp    ?Pulse 70 03/25/22 1555  ?Resp 14 03/25/22 1555  ?SpO2 94 % 03/25/22 1555  ?Vitals shown include unvalidated device data. ? ?Last Pain:  ?Vitals:  ? 03/25/22 1201  ?TempSrc: Oral  ?PainSc:   ?   ? ?Patients Stated Pain Goal: 4 (03/25/22 1156) ? ?Complications: No notable events documented. ?

## 2022-03-25 NOTE — Interval H&P Note (Signed)
History and Physical Interval Note: ? ?03/25/2022 ?12:54 PM ? ?David Curtis  has presented today for surgery, with the diagnosis of Right hip avascular necrosis.  The various methods of treatment have been discussed with the patient and family. After consideration of risks, benefits and other options for treatment, the patient has consented to  Procedure(s): ?TOTAL HIP ARTHROPLASTY ANTERIOR APPROACH (Right) as a surgical intervention.  The patient's history has been reviewed, patient examined, no change in status, stable for surgery.  I have reviewed the patient's chart and labs.  Questions were answered to the patient's satisfaction.   ? ? ?Mauri Pole ? ? ?

## 2022-03-25 NOTE — Op Note (Signed)
? NAME:  David Curtis                ACCOUNT NO.: [id number]  ?   ? MEDICAL RECORD NO.: [id number]  ?   ? FACILITY:  [facility]  ?   ? PHYSICIAN:  Mauri Pole  DATE OF BIRTH:  May 20, 1961 ?   ? DATE OF PROCEDURE:  03/25/2022 ?   ?                             OPERATIVE REPORT  ?   ?   ? PREOPERATIVE DIAGNOSIS: Right  hip osteoarthritis.  ?   ? POSTOPERATIVE DIAGNOSIS:  Right hip osteoarthritis.  ?   ? PROCEDURE:  Right total hip replacement through an anterior approach  ? utilizing DePuy THR system, component size 56 mm pinnacle cup, a size 36+4 neutral  ? Altrex liner, a size 9 Hi Actis stem with a 36+5 delta ceramic  ? ball.  ?   ? SURGEON:  Pietro Cassis. Alvan Dame, M.D.  ?   ? ASSISTANT:  Costella Hatcher, PA-C ?   ? ANESTHESIA:  Spinal.  ?   ? SPECIMENS:  None.  ?   ? COMPLICATIONS:  None.  ?   ? BLOOD LOSS:  100 cc ?   ? DRAINS:  None.  ?   ? INDICATION OF THE PROCEDURE:  David Curtis is a 61 y.o. male who had  ? presented to office for evaluation of right hip pain.  Radiographs revealed  ? progressive degenerative changes with bone-on-bone  ? articulation of the  hip joint, including subchondral cystic changes and osteophytes.  The patient had painful limited range of  ? motion significantly affecting their overall quality of life and function.  The patient was failing to   ? respond to conservative measures including medications and/or injections and activity modification and at this point was ready  ? to proceed with more definitive measures.  Consent was obtained for  ? benefit of pain relief.  Specific risks of infection, DVT, component  ? failure, dislocation, neurovascular injury, and need for revision surgery were reviewed in the office. ?   ? PROCEDURE IN DETAIL:  The patient was brought to operative theater.  ? Once adequate anesthesia, preoperative antibiotics, 2 gm of Ancef, 1 gm of Tranexamic Acid, and 10 mg of Decadron were administered, the patient was positioned supine on the [employer]  table.  Once the patient was safely positioned with adequate padding of boney prominences we predraped out the hip, and used fluoroscopy to confirm orientation of the pelvis.  ?   ? The right hip was then prepped and draped from proximal iliac crest to  ? mid thigh with a shower curtain technique.  ?   ? Time-out was performed identifying the patient, planned procedure, and the appropriate extremity.   ? ? An incision was then made 2 cm lateral to the  ? anterior superior iliac spine extending over the orientation of the  ? tensor fascia lata muscle and sharp dissection was carried down to the  ? fascia of the muscle.  ?   ? The fascia was then incised.  The muscle belly was identified and swept  ? laterally and retractor placed along the superior neck.  Following  ? cauterization of the circumflex vessels and removing some pericapsular  ? fat, a second cobra retractor was placed on the inferior neck.  A T-capsulotomy was made along the line of the  ? superior neck to the trochanteric fossa, then extended proximally and  ? distally.  Tag sutures were placed and the retractors were then placed  ? intracapsular.  We then identified the trochanteric fossa and  ? orientation of my neck cut and then made a neck osteotomy with the femur on traction.  The femoral  ? head was removed without difficulty or complication.  Traction was let  ? off and retractors were placed posterior and anterior around the  ? acetabulum.  ?   ? The labrum and foveal tissue were debrided.  I began reaming with a 51 mm  ? reamer and reamed up to 55 mm reamer with good bony bed preparation and a 56 mm ? cup was chosen.  The final 56 mm Pinnacle cup was then impacted under fluoroscopy to confirm the depth of penetration and orientation with respect to  ? Abduction and forward flexion.  A screw was placed into the ilium followed by the hole eliminator.  The final  ? 36+4 neutral Altrex liner was impacted with good visualized rim fit.  The cup was  positioned anatomically within the acetabular portion of the pelvis.  ?   ? At this point, the femur was rolled to 100 degrees.  Further capsule was  ? released off the inferior aspect of the femoral neck.  I then  ? released the superior capsule proximally.  With the leg in a neutral position the hook was placed laterally  ? along the femur under the vastus lateralis origin and elevated manually and then held in position using the hook attachment on the bed.  The leg was then extended and adducted with the leg rolled to 100  ? degrees of external rotation.  Retractors were placed along the medial calcar and posteriorly over the greater trochanter.  Once the proximal femur was fully  ? exposed, I used a box osteotome to set orientation.  I then began  ? broaching with the starting chili pepper broach and passed this by hand and then broached up to 9.  With the 9 broach in place I chose a high offset neck and did several trial reductions.  The offset was appropriate, leg lengths  ? appeared to be equal best matched with the +5 head ball trial confirmed radiographically.  ? Given these findings, I went ahead and dislocated the hip, repositioned all  ? retractors and positioned the right hip in the extended and abducted position.  ?The final 9 Hi Actis stem was  ? chosen and it was impacted down to the level of neck cut.  Based on this  ? and the trial reductions, a final 36+5 delta ceramic ball was chosen and  ? impacted onto a clean and dry trunnion, and the hip was reduced.  The  ? hip had been irrigated throughout the case again at this point.  I did  ? reapproximate the superior capsular leaflet to the anterior leaflet  ? using #1 Vicryl.  The fascia of the  ? tensor fascia lata muscle was then reapproximated using #1 Vicryl and #0 Stratafix sutures.  The  ? remaining wound was closed with 2-0 Vicryl and running 4-0 Monocryl.  ? The hip was cleaned, dried, and dressed sterilely using Dermabond and  ? Aquacel  dressing.  The patient was then brought  ? to recovery room in stable condition tolerating the procedure well.  ? ? Costella Hatcher, PA-C  was present for the entirety of the case involved from  ? preoperative positioning, perioperative retractor management, general  ? facilitation of the case, as well as primary wound closure as assistant.  ?   ?   ?   ? Pietro Cassis Alvan Dame, M.D.  ?   ?   ?03/25/2022 2:21 PM  ?   ?   ?

## 2022-03-25 NOTE — Anesthesia Postprocedure Evaluation (Signed)
Anesthesia Post Note ? ?Patient: Gennie Dib ? ?Procedure(s) Performed: TOTAL HIP ARTHROPLASTY ANTERIOR APPROACH (Right: Hip) ? ?  ? ?Patient location during evaluation: PACU ?Anesthesia Type: Spinal ?Level of consciousness: awake and alert ?Pain management: pain level controlled ?Vital Signs Assessment: post-procedure vital signs reviewed and stable ?Respiratory status: spontaneous breathing, nonlabored ventilation and respiratory function stable ?Cardiovascular status: blood pressure returned to baseline and stable ?Postop Assessment: no apparent nausea or vomiting ?Anesthetic complications: no ? ? ?No notable events documented. ? ?Last Vitals:  ?Vitals:  ? 03/25/22 1700 03/25/22 1715  ?BP: (!) 154/98 (!) 158/88  ?Pulse: 62 60  ?Resp: 16 10  ?Temp: (!) 36.4 ?C   ?SpO2: 96% 96%  ?  ?Last Pain:  ?Vitals:  ? 03/25/22 1715  ?TempSrc:   ?PainSc: 0-No pain  ? ? ?  ?  ?  ?  ?  ?  ? ?Lynda Rainwater ? ? ? ? ?

## 2022-03-26 ENCOUNTER — Encounter (HOSPITAL_COMMUNITY): Payer: Self-pay | Admitting: Orthopedic Surgery

## 2022-03-26 DIAGNOSIS — M1611 Unilateral primary osteoarthritis, right hip: Secondary | ICD-10-CM | POA: Diagnosis not present

## 2022-03-26 LAB — BASIC METABOLIC PANEL
Anion gap: 7 (ref 5–15)
BUN: 11 mg/dL (ref 8–23)
CO2: 25 mmol/L (ref 22–32)
Calcium: 9.1 mg/dL (ref 8.9–10.3)
Chloride: 104 mmol/L (ref 98–111)
Creatinine, Ser: 1 mg/dL (ref 0.61–1.24)
GFR, Estimated: >60 mL/min (ref >60)
Glucose, Bld: 215 mg/dL — ABNORMAL HIGH (ref 70–99)
Potassium: 4.3 mmol/L (ref 3.5–5.1)
Sodium: 136 mmol/L (ref 135–145)

## 2022-03-26 LAB — CBC
HCT: 37.9 % — ABNORMAL LOW (ref 39.0–52.0)
Hemoglobin: 12.5 g/dL — ABNORMAL LOW (ref 13.0–17.0)
MCH: 31.1 pg (ref 26.0–34.0)
MCHC: 33 g/dL (ref 30.0–36.0)
MCV: 94.3 fL (ref 80.0–100.0)
Platelets: 269 10*3/uL (ref 150–400)
RBC: 4.02 MIL/uL — ABNORMAL LOW (ref 4.22–5.81)
RDW: 13.6 % (ref 11.5–15.5)
WBC: 11.1 10*3/uL — ABNORMAL HIGH (ref 4.0–10.5)
nRBC: 0 % (ref 0.0–0.2)

## 2022-03-26 MED ORDER — METHOCARBAMOL 500 MG PO TABS: 500.0000 mg | ORAL_TABLET | Freq: Four times a day (QID) | ORAL | 0 refills | Status: AC | PRN

## 2022-03-26 MED ORDER — DOCUSATE SODIUM 100 MG PO CAPS: 100.0000 mg | ORAL_CAPSULE | Freq: Two times a day (BID) | ORAL | 0 refills | Status: AC

## 2022-03-26 MED ORDER — ASPIRIN 81 MG PO CHEW: 81.0000 mg | CHEWABLE_TABLET | Freq: Two times a day (BID) | ORAL | 0 refills | Status: AC

## 2022-03-26 MED ORDER — CELECOXIB 200 MG PO CAPS: 200.0000 mg | ORAL_CAPSULE | Freq: Two times a day (BID) | ORAL | 0 refills | Status: AC

## 2022-03-26 MED ORDER — POLYETHYLENE GLYCOL 3350 17 G PO PACK: 17.0000 g | PACK | Freq: Every day | ORAL | 0 refills | Status: AC | PRN

## 2022-03-26 NOTE — Plan of Care (Signed)
Pt ready to DC home with family. 

## 2022-03-26 NOTE — TOC Transition Note (Signed)
Transition of Care (TOC) - CM/SW Discharge Note ? ?Patient Details  ?Name: David Curtis ?MRN: [id number] ?Date of Birth: [birth year] ? ?Transition of Care (TOC) CM/SW Contact:  ?Sherie Don, LCSW ?Phone Number: ?03/26/2022, 9:46 AM ? ?Clinical Narrative: Patient is expected to discharge home after working with PT. CSW met with patient to confirm discharge plan. Patient will go home with a home exercise program (HEP). Patient has a rolling walker, shower chair, and wheelchair at home so there are no DME needs at this time. TOC signing off. ? ?Final next level of care: Home/Self Care ?Barriers to Discharge: No Barriers Identified ? ?Patient Goals and CMS Choice ?Patient states their goals for this hospitalization and ongoing recovery are:: Discharge home with HEP ?Choice offered to / list presented to : NA ? ?Discharge Plan and Services         ?DME Arranged: N/A ?DME Agency: NA ? ?Readmission Risk Interventions ?   ? View : No data to display.  ?  ?  ?  ? ?

## 2022-03-26 NOTE — Progress Notes (Signed)
Patient ID: David Curtis, male   DOB: [birth year], 61 y.o.   MRN: [id number] ?Subjective: ?1 Day Post-Op Procedure(s) (LRB): ?TOTAL HIP ARTHROPLASTY ANTERIOR APPROACH (Right) ?  ? ?Patient reports pain as mild to moderate.  No issues over night. ? ?Objective:  ? ?VITALS:   ?Vitals:  ? 03/25/22 2341 03/26/22 0332  ?BP: (!) 161/91 (!) 146/91  ?Pulse: 93 78  ?Resp: 18 18  ?Temp: 97.7 ?F (36.5 ?C) (!) 97.5 ?F (36.4 ?C)  ?SpO2: 99% 99%  ? ? ?Neurovascular intact ?Incision: dressing C/D/I ? ?LABS ?Recent Labs  ?  03/26/22 ?0305  ?HGB 12.5*  ?HCT 37.9*  ?WBC 11.1*  ?PLT 269  ? ? ?Recent Labs  ?  03/26/22 ?0305  ?NA 136  ?K 4.3  ?BUN 11  ?CREATININE 1.00  ?GLUCOSE 215*  ? ? ?No results for input(s): LABPT, INR in the last 72 hours. ? ? ?Assessment/Plan: ?1 Day Post-Op Procedure(s) (LRB): ?TOTAL HIP ARTHROPLASTY ANTERIOR APPROACH (Right) ? ? ?Advance diet ?Up with therapy ?Plan to go home today after therapy ?RTC in 2 weeks ?Will need dental prophylaxis for upcomming treatment  ?

## 2022-03-26 NOTE — Evaluation (Signed)
Physical Therapy Evaluation ?Patient Details ?Name: David Curtis ?MRN: [id number] ?DOB: [birth year] ?Today's Date: 03/26/2022 ? ?History of Present Illness ? Pt s/p R THR anterior approach secondary to avascular necrosis.  ?Clinical Impression ? Pt presents with dependencies in mobility secondary to the above diagnosis. Pt was able to ambulate in the hall with min guard assist 187f with RW. Pt educated on stair technique verbally and discussed d/c home after BID treatment today. Pt reported he has the option to stay downstairs if needed therefore we discussed the technique and did not return demonstrate this morning. Pt was able to complete some R hip exerciese with verbal cues and will reinforce HEP in BID treatment today.  Pt reports his girlfriend is currently in the hospital, but is normally home to assist as needed. Pt will continue to benefit from acute skilled PT to maximize mobility and independence for d/c home around lunch after BID treatment.  ?   ? ?Recommendations for follow up therapy are one component of a multi-disciplinary discharge planning process, led by the attending physician.  Recommendations may be updated based on patient status, additional functional criteria and insurance authorization. ? ?Follow Up Recommendations Follow physician's recommendations for discharge plan and follow up therapies ? ?  ?Assistance Recommended at Discharge PRN  ?Patient can return home with the following ? Assistance with cooking/housework;Assist for transportation ? ?  ?Equipment Recommendations None recommended by PT  ?Recommendations for Other Services ?    ?  ?Functional Status Assessment Patient has had a recent decline in their functional status and demonstrates the ability to make significant improvements in function in a reasonable and predictable amount of time.  ? ?  ?Precautions / Restrictions Precautions ?Precautions: Anterior Hip ?Restrictions ?Weight Bearing Restrictions: No ?RLE Weight Bearing: Weight  bearing as tolerated  ? ?  ? ?Mobility ? Bed Mobility ?Overal bed mobility: Needs Assistance ?Bed Mobility: Supine to Sit ?  ?  ?Supine to sit: Min assist, HOB elevated ?  ?  ?General bed mobility comments: assistance to slide R LE over in the bed, verbal cues for technique ?Patient Response: Cooperative ? ?Transfers ?Overall transfer level: Needs assistance ?Equipment used: Rolling walker (2 wheels) ?Transfers: Sit to/from Stand ?Sit to Stand: Min guard ?  ?  ?  ?  ?  ?General transfer comment: cues for hand placement and technique ?  ? ?Ambulation/Gait ?Ambulation/Gait assistance: Min guard ?Gait Distance (Feet): 175 Feet ?Assistive device: Rolling walker (2 wheels) ?Gait Pattern/deviations: Step-through pattern, Decreased stride length ?Gait velocity: decr ?  ?  ?  ? ?Stairs ?Stairs:  (discussed technique, can stay downstairs) ?  ?  ?  ?  ? ?Wheelchair Mobility ?  ? ?Modified Rankin (Stroke Patients Only) ?  ? ?  ? ?Balance Overall balance assessment: No apparent balance deficits (not formally assessed) ?  ?  ?  ?  ?  ?  ?  ?  ?  ?  ?  ?  ?  ?  ?  ?  ?  ?  ?   ? ? ? ?Pertinent Vitals/Pain Pain Assessment ?Pain Assessment: 0-10 ?Pain Score: 8  ?Pain Location: right hip ?Pain Descriptors / Indicators: Discomfort ?Pain Intervention(s): Limited activity within patient's tolerance, Repositioned, PCA encouraged, Monitored during session  ? ? ?Home Living Family/patient expects to be discharged to:: Private residence ?Living Arrangements: Spouse/significant other ?Available Help at Discharge: Family ?Type of Home: House ?Home Access: Level entry ?  ?  ?  ?Home Layout: Two level;Able to live  on main level with bedroom/bathroom ?Home Equipment: Wheelchair - Publishing copy (2 wheels);Cane - single point;Shower seat - built in ?   ?  ?Prior Function Prior Level of Function : Independent/Modified Independent ?  ?  ?  ?  ?  ?  ?  ?  ?  ? ? ?Hand Dominance  ?   ? ?  ?Extremity/Trunk Assessment  ? Upper Extremity  Assessment ?Upper Extremity Assessment: Defer to OT evaluation ?  ? ?Lower Extremity Assessment ?Lower Extremity Assessment: RLE deficits/detail ?RLE Deficits / Details: strength 2-3/5 due to pain ?  ? ?Cervical / Trunk Assessment ?Cervical / Trunk Assessment: Normal  ?Communication  ? Communication: No difficulties  ?Cognition Arousal/Alertness: Awake/alert ?Behavior During Therapy: [facility] for tasks assessed/performed ?Overall Cognitive Status: Within Functional Limits for tasks assessed ?  ?  ?  ?  ?  ?  ?  ?  ?  ?  ?  ?  ?  ?  ?  ?  ?  ?  ?  ? ?  ?General Comments   ? ?  ?Exercises Total Joint Exercises ?Ankle Circles/Pumps: AROM, Strengthening, Both, 10 reps, Supine ?Quad Sets: AROM, Strengthening, Both, 10 reps, Supine ?Heel Slides: AROM, Strengthening, Right, 10 reps, Supine ?Long Arc Quad: AROM, Strengthening, Right, 10 reps, Seated  ? ?Assessment/Plan  ?  ?PT Assessment Patient needs continued PT services  ?PT Problem List Decreased strength;Pain;Decreased range of motion;Decreased mobility;Decreased activity tolerance;Decreased safety awareness;Decreased balance ? ?   ?  ?PT Treatment Interventions DME instruction;Functional mobility training;Balance training;Patient/family education;Gait training;Therapeutic activities;Stair training;Therapeutic exercise;Neuromuscular re-education   ? ?PT Goals (Current goals can be found in the Care Plan section)  ?Acute Rehab PT Goals ?Patient Stated Goal: to go home asap ?PT Goal Formulation: With patient ?Time For Goal Achievement: 04/02/22 ?Potential to Achieve Goals: Good ? ?  ?Frequency 7X/week ?  ? ? ?Co-evaluation   ?  ?  ?  ?  ? ? ?  ?AM-PAC PT "6 Clicks" Mobility  ?Outcome Measure Help needed turning from your back to your side while in a flat bed without using bedrails?: A Little ?Help needed moving from lying on your back to sitting on the side of a flat bed without using bedrails?: A Little ?Help needed moving to and from a bed to a chair (including a  wheelchair)?: A Little ?Help needed standing up from a chair using your arms (e.g., wheelchair or bedside chair)?: A Little ?Help needed to walk in hospital room?: A Little ?Help needed climbing 3-5 steps with a railing? : A Little ?6 Click Score: 18 ? ?  ?End of Session Equipment Utilized During Treatment: Gait belt ?Activity Tolerance: Patient tolerated treatment well ?Patient left: in chair;with call bell/phone within reach;with nursing/sitter in room ?Nurse Communication: Mobility status ?PT Visit Diagnosis: Muscle weakness (generalized) (M62.81);Other abnormalities of gait and mobility (R26.89) ?  ? ?Time: [phone] ?PT Time Calculation (min) (ACUTE ONLY): 34 min ? ? ?Charges:   PT Evaluation ?$PT Eval Moderate Complexity: 1 Mod ?PT Treatments ?$Gait Training: 8-22 mins ?  ?   ? ? ?Lelon Mast ?03/26/2022, 8:40 AM ? ?

## 2022-03-26 NOTE — Progress Notes (Signed)
Physical Therapy Treatment ?Patient Details ?Name: David Curtis ?MRN: [id number] ?DOB: 1961/10/17 ?Today's Date: 03/26/2022 ? ? ?History of Present Illness Pt s/p R THR anterior approach secondary to avascular necrosis. ? ?  ?PT Comments  ? ? Pt completed BID treatment this morning and is ready for d/c home with family. Pt educated on HEP and given a handout. Pt reported increased pain and encouraged to get a pain pill prior to d/c home. Pt is ambulating in the hall with mod independence with RW 250 ft. Pt reported he does not have steps to enter his home therefore we verbally reviewed technique. Pt will continue with current HEP until out patient PT or HHPT begins. All education is complete and pt is ready for d/c home.    ?Recommendations for follow up therapy are one component of a multi-disciplinary discharge planning process, led by the attending physician.  Recommendations may be updated based on patient status, additional functional criteria and insurance authorization. ? ?Follow Up Recommendations ? Follow physician's recommendations for discharge plan and follow up therapies ?  ?  ?Assistance Recommended at Discharge PRN  ?Patient can return home with the following Assistance with cooking/housework;Assist for transportation ?  ?Equipment Recommendations ? None recommended by PT  ?  ?Recommendations for Other Services   ? ? ?  ?Precautions / Restrictions Precautions ?Precautions: Anterior Hip ?Restrictions ?Weight Bearing Restrictions: No ?RLE Weight Bearing: Weight bearing as tolerated ?LLE Weight Bearing: Weight bearing as tolerated  ?  ? ?Mobility ? Bed Mobility ?Overal bed mobility: Needs Assistance ?Bed Mobility: Sit to Supine ?  ?  ?Supine to sit: Min assist, HOB elevated ?Sit to supine: Modified independent (Device/Increase time) ?  ?General bed mobility comments: assistance to slide R LE over in the bed, verbal cues for technique ?Patient Response: Cooperative ? ?Transfers ?Overall transfer level:  Modified independent ?Equipment used: Rolling walker (2 wheels) ?Transfers: Sit to/from Stand ?Sit to Stand: Modified independent (Device/Increase time) ?  ?  ?  ?  ?  ?General transfer comment: cues for hand placement and technique ?  ? ?Ambulation/Gait ?Ambulation/Gait assistance: Modified independent (Device/Increase time) ?Gait Distance (Feet): 250 Feet ?Assistive device: Rolling walker (2 wheels) ?Gait Pattern/deviations: Step-through pattern, Decreased stride length ?Gait velocity: decr ?  ?  ?  ? ? ?Stairs ?Stairs:  (verbally reviewed technique-no STE home) ?  ?  ?  ?  ? ? ?Wheelchair Mobility ?  ? ?Modified Rankin (Stroke Patients Only) ?  ? ? ?  ?Balance Overall balance assessment: No apparent balance deficits (not formally assessed) ?  ?  ?  ?  ?  ?  ?  ?  ?  ?  ?  ?  ?  ?  ?  ?  ?  ?  ?  ? ?  ?Cognition Arousal/Alertness: Awake/alert ?Behavior During Therapy: [facility] for tasks assessed/performed ?Overall Cognitive Status: Within Functional Limits for tasks assessed ?  ?  ?  ?  ?  ?  ?  ?  ?  ?  ?  ?  ?  ?  ?  ?  ?  ?  ?  ? ?  ?Exercises Total Joint Exercises ?Ankle Circles/Pumps: AROM, Strengthening, Both, 10 reps, Supine ?Quad Sets: AROM, Strengthening, Both, 10 reps, Supine ?Heel Slides: AAROM, Strengthening, Right, 10 reps, Supine ?Hip ABduction/ADduction: AROM, Strengthening, Right, 10 reps, Supine ?Long Arc Quad: AROM, Strengthening, Right, 10 reps, Seated ? ?  ?General Comments   ?  ?  ? ?Pertinent Vitals/Pain Pain Assessment ?Pain  Assessment: 0-10 ?Pain Score: 8  ?Pain Location: right hip ?Pain Descriptors / Indicators: Discomfort ?Pain Intervention(s): Limited activity within patient's tolerance, Repositioned, Monitored during session  ? ? ?Home Living Family/patient expects to be discharged to:: Private residence ?Living Arrangements: Spouse/significant other ?Available Help at Discharge: Family ?Type of Home: House ?Home Access: Level entry ?  ?  ?  ?Home Layout: Two level;Able to live on main level  with bedroom/bathroom ?Home Equipment: Wheelchair - Publishing copy (2 wheels);Cane - single point;Shower seat - built in ?   ?  ?Prior Function    ?  ?  ?   ? ?PT Goals (current goals can now be found in the care plan section) Acute Rehab PT Goals ?Patient Stated Goal: to go home asap ?PT Goal Formulation: With patient ?Time For Goal Achievement: 04/02/22 ?Potential to Achieve Goals: Good ?Progress towards PT goals: Goals met/education completed, patient discharged from PT ? ?  ?Frequency ? ? ? 7X/week ? ? ? ?  ?PT Plan Current plan remains appropriate  ? ? ?Co-evaluation   ?  ?  ?  ?  ? ?  ?AM-PAC PT "6 Clicks" Mobility   ?Outcome Measure ? Help needed turning from your back to your side while in a flat bed without using bedrails?: None ?Help needed moving from lying on your back to sitting on the side of a flat bed without using bedrails?: None ?Help needed moving to and from a bed to a chair (including a wheelchair)?: None ?Help needed standing up from a chair using your arms (e.g., wheelchair or bedside chair)?: None ?Help needed to walk in hospital room?: None ?Help needed climbing 3-5 steps with a railing? : A Little ?6 Click Score: 23 ? ?  ?End of Session Equipment Utilized During Treatment: Gait belt ?Activity Tolerance: Patient tolerated treatment well ?Patient left: in bed;with call bell/phone within reach ?Nurse Communication: Mobility status ?PT Visit Diagnosis: Muscle weakness (generalized) (M62.81);Other abnormalities of gait and mobility (R26.89) ?  ? ? ?Time: [id number] ?PT Time Calculation (min) (ACUTE ONLY): 29 min ? ?Charges:  $Gait Training: 8-22 mins ?$Therapeutic Exercise: 8-22 mins          ?          ? ? ? ?Lelon Mast ?03/26/2022, 11:20 AM ? ?

## 2022-03-31 NOTE — Discharge Summary (Signed)
Patient ID: ?[name] ?MRN: [id number] ?DOB/AGE: [birth year] 61 y.o. ? ?Admit date: 03/25/2022 ?Discharge date: 03/26/2022 ? ?Admission Diagnoses:  ?Right hip osteoarthritis ? ?Discharge Diagnoses:  ?Principal Problem: ?  S/P total right hip arthroplasty ? ? ?Past Medical History:  ?Diagnosis Date  ? Alcohol dependence ([place])   ? Aortic atherosclerosis ([place])   ? Arthritis   ? CAD (coronary artery disease)   ? Cigarette nicotine dependence   ? Cocaine use   ? COPD (chronic obstructive pulmonary disease) ([place])   ? GERD (gastroesophageal reflux disease)   ? Hemorrhoids   ? Hypertension   ? Lipoma   ? Liver nodule   ? OA (osteoarthritis)   ? Overweight   ? Pancreatic cyst   ? Pneumonia   ? Prediabetes   ? Pulmonary nodules   ? ? ?Surgeries: Procedure(s): ?TOTAL HIP ARTHROPLASTY ANTERIOR APPROACH on 03/25/2022 ?  ?Consultants:  ? ?Discharged Condition: Improved ? ?Hospital Course: Kye Hedden is an 61 y.o. male who was admitted 03/25/2022 for operative treatment ofS/P total right hip arthroplasty. Patient has severe unremitting pain that affects sleep, daily activities, and work/hobbies. After pre-op clearance the patient was taken to the operating room on 03/25/2022 and underwent  Procedure(s): ?[place].   ? ?Patient was given perioperative antibiotics:  ?Anti-infectives (From admission, onward)  ? ? Start     Dose/Rate Route Frequency Ordered Stop  ? 03/25/22 2000  ceFAZolin (ANCEF) IVPB 2g/100 mL premix       ? 2 g ?200 mL/hr over 30 Minutes Intravenous Every 6 hours 03/25/22 1805 03/26/22 0700  ? 03/25/22 1145  ceFAZolin (ANCEF) IVPB 2g/100 mL premix       ? 2 g ?200 mL/hr over 30 Minutes Intravenous On call to O.R. 03/25/22 1138 03/25/22 1435  ? ?  ?  ? ?Patient was given sequential compression devices, early ambulation, and chemoprophylaxis to prevent DVT. Patient worked with PT and was meeting their goals regarding safe ambulation and transfers. ? ?Patient benefited maximally from hospital  stay and there were no complications.   ? ?Recent vital signs: No data found.  ? ?Recent laboratory studies: No results for input(s): WBC, HGB, HCT, PLT, NA, K, CL, CO2, BUN, CREATININE, GLUCOSE, INR, CALCIUM in the last 72 hours. ? ?Invalid input(s): PT, 2 ? ? ?Discharge Medications:   ?Allergies as of 03/26/2022   ?No Known Allergies ?  ? ?  ?Medication List  ?  ? ?STOP taking these medications   ? ?amoxicillin-clavulanate 875-125 MG tablet ?Commonly known as: Augmentin ?  ?aspirin EC 81 MG tablet ?Replaced by: aspirin 81 MG chewable tablet ?  ?diclofenac 50 MG EC tablet ?Commonly known as: VOLTAREN ?  ?lidocaine 5 % ?Commonly known as: Lidoderm ?  ?promethazine 25 MG tablet ?Commonly known as: PHENERGAN ?  ? ?  ? ?TAKE these medications   ? ?albuterol (2.5 MG/3ML) 0.083% nebulizer solution ?Commonly known as: PROVENTIL ?Take 3 mLs (2.5 mg total) by nebulization every 6 (six) hours as needed for wheezing or shortness of breath. ?  ?albuterol 108 (90 Base) MCG/ACT inhaler ?Commonly known as: VENTOLIN HFA ?Inhale 2 puffs into the lungs every 4 (four) hours as needed for wheezing or shortness of breath. ?  ?aspirin 81 MG chewable tablet ?Chew 1 tablet (81 mg total) by mouth 2 (two) times daily for 28 days. ?Replaces: aspirin EC 81 MG tablet ?  ?celecoxib 200 MG capsule ?Commonly known as: CELEBREX ?Take 1 capsule (200 mg total) by mouth  2 (two) times daily. ?  ?cholecalciferol 25 MCG (1000 UNIT) tablet ?Commonly known as: VITAMIN D3 ?Take 1,000 Units by mouth daily. ?  ?docusate sodium 100 MG capsule ?Commonly known as: COLACE ?Take 1 capsule (100 mg total) by mouth 2 (two) times daily. ?  ?fluticasone furoate-vilanterol 200-25 MCG/INH Aepb ?Commonly known as: Breo Ellipta ?Inhale 1 puff into the lungs daily. ?  ?hydrochlorothiazide 12.5 MG capsule ?Commonly known as: MICROZIDE ?Take 1 capsule (12.5 mg total) by mouth daily. ?  ?lisinopril 40 MG tablet ?Commonly known as: ZESTRIL ?Take 1 tablet (40 mg total) by mouth  daily. ?  ?losartan 50 MG tablet ?Commonly known as: COZAAR ?Take 50 mg by mouth daily. ?  ?methocarbamol 500 MG tablet ?Commonly known as: ROBAXIN ?Take 1 tablet (500 mg total) by mouth every 6 (six) hours as needed for muscle spasms. ?What changed:  ?when to take this ?reasons to take this ?  ?metoprolol tartrate 100 MG tablet ?Commonly known as: LOPRESSOR ?Take 1 tablet (100 mg total) by mouth once for 1 dose. 2 HOURS PRIOR TO PROCEDURE ?  ?polyethylene glycol 17 g packet ?Commonly known as: MIRALAX / GLYCOLAX ?Take 17 g by mouth daily as needed for mild constipation. ?  ?rosuvastatin 20 MG tablet ?Commonly known as: CRESTOR ?Take 1 tablet (20 mg total) by mouth daily. ?  ? ?  ? ?  ?  ? ? ?  ?Discharge Care Instructions  ?(From admission, onward)  ?  ? ? ?  ? ?  Start     Ordered  ? 03/26/22 0000  Change dressing       ?Comments: Maintain surgical dressing until follow up in the clinic. If the edges start to pull up, may reinforce with tape. If the dressing is no longer working, may remove and cover with gauze and tape, but must keep the area dry and clean.  Call with any questions or concerns.  ? 03/26/22 1035  ? ?  ?  ? ?  ? ? ?Diagnostic Studies: DG Pelvis Portable ? ?Result Date: 03/25/2022 ?CLINICAL DATA:  Status post right hip arthroplasty EXAM: PORTABLE PELVIS 1-2 VIEWS COMPARISON:  Hip radiographs 03/20/2022 FINDINGS: There has been interval right hip arthroplasty. Hardware alignment is within expected limits, without evidence of complication. There is expected surrounding postoperative soft tissue gas. The left hip is unremarkable. The symphysis pubis is intact. IMPRESSION: Status post right hip arthroplasty without evidence of complication. Electronically Signed   By: Valetta Mole M.D.   On: 03/25/2022 16:36  ? ?DG C-Arm 1-60 Min-No Report ? ?Result Date: 03/25/2022 ?Fluoroscopy was utilized by the requesting physician.  No radiographic interpretation.  ? ?DG C-Arm 1-60 Min-No Report ? ?Result Date:  03/25/2022 ?Fluoroscopy was utilized by the requesting physician.  No radiographic interpretation.  ? ?DG HIP UNILAT WITH PELVIS 1V RIGHT ? ?Result Date: 03/25/2022 ?CLINICAL DATA:  Fluoroscopic assistance for right hip arthroplasty EXAM: DG HIP (WITH OR WITHOUT PELVIS) 1V RIGHT COMPARISON:  03/20/2022 FINDINGS: Fluoroscopic images show right hip arthroplasty. Fluoroscopic time 18 seconds. Radiation dose 2.57 mGy. IMPRESSION: Fluoroscopic assistance was provided for right hip arthroplasty. Electronically Signed   By: Elmer Picker M.D.   On: 03/25/2022 16:04  ? ?DG Hip Unilat With Pelvis 2-3 Views Right ? ?Result Date: 03/20/2022 ?CLINICAL DATA:  Chronic right hip pain. EXAM: DG HIP (WITH OR WITHOUT PELVIS) 2-3V RIGHT COMPARISON:  CT scan of February 13, 2022. FINDINGS: Severe lucent areas are seen within the right femoral head with deformity  of superior portion of right femoral head consistent with severe avascular necrosis. This is grossly unchanged compared to prior CT scan. No acute fracture or dislocation is noted. IMPRESSION: Findings consistent with severe avascular necrosis of right femoral head. Partial collapse of articular surface is again noted which is unchanged compared to prior CT scan. No definite acute abnormality is noted. Electronically Signed   By: Marijo Conception M.D.   On: 03/20/2022 11:18   ? ?Disposition: Discharge disposition: 01-Home or Self Care ? ? ? ? ? ? ?Discharge Instructions   ? ? Call MD / Call 911   Complete by: As directed ?  ? If you experience chest pain or shortness of breath, CALL 911 and be transported to the hospital emergency room.  If you develope a fever above 101 F, pus (white drainage) or increased drainage or redness at the wound, or calf pain, call your surgeon's office.  ? Change dressing   Complete by: As directed ?  ? Maintain surgical dressing until follow up in the clinic. If the edges start to pull up, may reinforce with tape. If the dressing is no longer working,  may remove and cover with gauze and tape, but must keep the area dry and clean.  Call with any questions or concerns.  ? Constipation Prevention   Complete by: As directed ?  ? Drink plenty of fluids.  [place]

## 2022-04-24 ENCOUNTER — Emergency Department (HOSPITAL_COMMUNITY): Payer: Medicare Other

## 2022-04-24 ENCOUNTER — Other Ambulatory Visit: Payer: Self-pay

## 2022-04-24 ENCOUNTER — Encounter (HOSPITAL_COMMUNITY): Payer: Self-pay

## 2022-04-24 ENCOUNTER — Emergency Department (HOSPITAL_COMMUNITY)
Admission: EM | Admit: 2022-04-24 | Discharge: 2022-04-24 | Disposition: A | Discharge status: Home / Self Care | Payer: Medicare Other | Attending: Emergency Medicine | Admitting: Emergency Medicine

## 2022-04-24 DIAGNOSIS — M25551 Pain in right hip: Secondary | ICD-10-CM | POA: Insufficient documentation

## 2022-04-24 DIAGNOSIS — Y92009 Unspecified place in unspecified non-institutional (private) residence as the place of occurrence of the external cause: Secondary | ICD-10-CM | POA: Insufficient documentation

## 2022-04-24 MED ORDER — KETOROLAC TROMETHAMINE 60 MG/2ML IM SOLN
30.0000 mg | Freq: Once | INTRAMUSCULAR | Status: AC
  Administered 2022-04-24: 30 mg via INTRAMUSCULAR
  Filled 2022-04-24: qty 2

## 2022-04-24 MED ORDER — LIDOCAINE 5 % EX PTCH
1.0000 | MEDICATED_PATCH | CUTANEOUS | Status: DC
  Administered 2022-04-24: 1 via TRANSDERMAL
  Filled 2022-04-24: qty 1
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
## 2022-05-30 ENCOUNTER — Encounter: Payer: Self-pay | Admitting: Cardiology

## 2022-05-30 ENCOUNTER — Ambulatory Visit: Payer: Medicare Other | Admitting: Cardiology

## 2022-07-09 ENCOUNTER — Encounter: Payer: Self-pay | Admitting: *Deleted

## 2022-09-18 ENCOUNTER — Other Ambulatory Visit: Payer: Self-pay | Admitting: Registered Nurse

## 2022-09-18 ENCOUNTER — Ambulatory Visit
Admission: RE | Admit: 2022-09-18 | Discharge: 2022-09-18 | Disposition: A | Discharge status: Home / Self Care | Payer: Medicare Other | Source: Ambulatory Visit | Attending: Registered Nurse | Admitting: Registered Nurse

## 2022-09-18 DIAGNOSIS — M549 Dorsalgia, unspecified: Secondary | ICD-10-CM

## 2022-09-18 DIAGNOSIS — M159 Polyosteoarthritis, unspecified: Secondary | ICD-10-CM

## 2022-09-18 DIAGNOSIS — M25561 Pain in right knee: Secondary | ICD-10-CM

## 2022-09-18 DIAGNOSIS — M25551 Pain in right hip: Secondary | ICD-10-CM
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
## 2022-10-07 ENCOUNTER — Ambulatory Visit: Payer: Medicare Other | Admitting: Nurse Practitioner

## 2022-10-07 DIAGNOSIS — I2510 Atherosclerotic heart disease of native coronary artery without angina pectoris: Secondary | ICD-10-CM

## 2022-10-27 ENCOUNTER — Encounter: Payer: Self-pay | Admitting: Registered Nurse

## 2023-07-10 ENCOUNTER — Encounter: Payer: Self-pay | Admitting: Registered Nurse

## 2023-07-13 ENCOUNTER — Other Ambulatory Visit: Payer: Self-pay | Admitting: Registered Nurse

## 2023-07-13 DIAGNOSIS — K862 Cyst of pancreas: Secondary | ICD-10-CM

## 2023-07-13 DIAGNOSIS — K7689 Other specified diseases of liver: Secondary | ICD-10-CM

## 2023-07-13 DIAGNOSIS — R918 Other nonspecific abnormal finding of lung field: Secondary | ICD-10-CM

## 2023-08-04 ENCOUNTER — Inpatient Hospital Stay: Admission: RE | Admit: 2023-08-04 | Payer: 59 | Source: Ambulatory Visit

## 2023-08-04 ENCOUNTER — Other Ambulatory Visit: Payer: 59

## 2024-08-29 ENCOUNTER — Other Ambulatory Visit (HOSPITAL_COMMUNITY): Payer: Self-pay | Admitting: Registered Nurse

## 2024-08-29 DIAGNOSIS — R911 Solitary pulmonary nodule: Secondary | ICD-10-CM

## 2024-08-29 DIAGNOSIS — K862 Cyst of pancreas: Secondary | ICD-10-CM

## 2024-09-08 ENCOUNTER — Encounter (HOSPITAL_COMMUNITY): Payer: Self-pay

## 2024-09-08 ENCOUNTER — Ambulatory Visit (HOSPITAL_COMMUNITY): Admission: RE | Admit: 2024-09-08 | Source: Ambulatory Visit

## 2024-09-08 ENCOUNTER — Ambulatory Visit (HOSPITAL_COMMUNITY): Attending: Registered Nurse

## 2024-09-08 ENCOUNTER — Other Ambulatory Visit (HOSPITAL_COMMUNITY): Payer: Self-pay | Admitting: Registered Nurse

## 2024-09-08 DIAGNOSIS — K862 Cyst of pancreas: Secondary | ICD-10-CM

## 2024-09-30 ENCOUNTER — Encounter (HOSPITAL_COMMUNITY): Payer: Self-pay

## 2024-09-30 ENCOUNTER — Emergency Department (HOSPITAL_COMMUNITY)
Admission: EM | Admit: 2024-09-30 | Discharge: 2024-10-01 | Disposition: A | Discharge status: Home / Self Care | Attending: Emergency Medicine | Admitting: Emergency Medicine

## 2024-09-30 ENCOUNTER — Other Ambulatory Visit: Payer: Self-pay

## 2024-09-30 ENCOUNTER — Emergency Department (HOSPITAL_COMMUNITY)

## 2024-09-30 DIAGNOSIS — F10129 Alcohol abuse with intoxication, unspecified: Secondary | ICD-10-CM | POA: Diagnosis not present

## 2024-09-30 DIAGNOSIS — Y908 Blood alcohol level of 240 mg/100 ml or more: Secondary | ICD-10-CM | POA: Diagnosis not present

## 2024-09-30 DIAGNOSIS — F10920 Alcohol use, unspecified with intoxication, uncomplicated: Secondary | ICD-10-CM

## 2024-09-30 DIAGNOSIS — R079 Chest pain, unspecified: Secondary | ICD-10-CM

## 2024-09-30 DIAGNOSIS — R0789 Other chest pain: Secondary | ICD-10-CM | POA: Diagnosis present

## 2024-09-30 DIAGNOSIS — Z79899 Other long term (current) drug therapy: Secondary | ICD-10-CM | POA: Diagnosis not present

## 2024-09-30 DIAGNOSIS — D179 Benign lipomatous neoplasm, unspecified: Secondary | ICD-10-CM | POA: Diagnosis not present

## 2024-09-30 DIAGNOSIS — R7981 Abnormal blood-gas level: Secondary | ICD-10-CM | POA: Diagnosis not present

## 2024-09-30 LAB — CBC WITH DIFFERENTIAL/PLATELET
Abs Immature Granulocytes: 0.02 K/uL (ref 0.00–0.07)
Basophils Absolute: 0.1 K/uL (ref 0.0–0.1)
Basophils Relative: 1 %
Eosinophils Absolute: 0.1 K/uL (ref 0.0–0.5)
Eosinophils Relative: 1 %
HCT: 46.2 % (ref 39.0–52.0)
Hemoglobin: 15.4 g/dL (ref 13.0–17.0)
Immature Granulocytes: 0 %
Lymphocytes Relative: 39 %
Lymphs Abs: 3.5 K/uL (ref 0.7–4.0)
MCH: 31.2 pg (ref 26.0–34.0)
MCHC: 33.3 g/dL (ref 30.0–36.0)
MCV: 93.5 fL (ref 80.0–100.0)
Monocytes Absolute: 0.8 K/uL (ref 0.1–1.0)
Monocytes Relative: 9 %
Neutro Abs: 4.5 K/uL (ref 1.7–7.7)
Neutrophils Relative %: 50 %
Platelets: 308 K/uL (ref 150–400)
RBC: 4.94 MIL/uL (ref 4.22–5.81)
RDW: 13.1 % (ref 11.5–15.5)
WBC: 9 K/uL (ref 4.0–10.5)
nRBC: 0 % (ref 0.0–0.2)

## 2024-09-30 LAB — ETHANOL: Alcohol, Ethyl (B): 243 mg/dL — ABNORMAL HIGH (ref <15)

## 2024-09-30 LAB — COMPREHENSIVE METABOLIC PANEL WITH GFR
ALT: 27 U/L (ref 0–44)
AST: 30 U/L (ref 15–41)
Albumin: 4.1 g/dL (ref 3.5–5.0)
Alkaline Phosphatase: 60 U/L (ref 38–126)
Anion gap: 16 — ABNORMAL HIGH (ref 5–15)
BUN: 14 mg/dL (ref 8–23)
CO2: 20 mmol/L — ABNORMAL LOW (ref 22–32)
Calcium: 9.3 mg/dL (ref 8.9–10.3)
Chloride: 103 mmol/L (ref 98–111)
Creatinine, Ser: 0.93 mg/dL (ref 0.61–1.24)
GFR, Estimated: >60 mL/min (ref >60)
Glucose, Bld: 89 mg/dL (ref 70–99)
Potassium: 3.9 mmol/L (ref 3.5–5.1)
Sodium: 139 mmol/L (ref 135–145)
Total Bilirubin: 0.7 mg/dL (ref 0.0–1.2)
Total Protein: 7.4 g/dL (ref 6.5–8.1)

## 2024-09-30 LAB — TROPONIN I (HIGH SENSITIVITY): Troponin I (High Sensitivity): 9 ng/L (ref <18)

## 2024-09-30 MED ORDER — ALBUTEROL SULFATE HFA 108 (90 BASE) MCG/ACT IN AERS
2.0000 | INHALATION_SPRAY | Freq: Once | RESPIRATORY_TRACT | Status: AC
  Administered 2024-09-30: 2 via RESPIRATORY_TRACT
  Filled 2024-09-30: qty 6.7
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
## 2024-09-30 NOTE — ED Provider Notes (Signed)
 [place] EMERGENCY DEPARTMENT AT [facility] Provider Note   CSN: [id number] Arrival date & time: 09/30/24  2136     Patient presents with: Chest Pain   David Curtis is a 63 y.o. male who presents with concern for chest pain, in custody of [facility] [employer].  Patient was reportedly arrested and reported chest pain and route to the jail prompting diversion to the emergency department.  Patient very vague in his reported chest pain, cannot describe the pain, and generally points to his right chest when asked where the pain is located.  Denies any radiation of pain, cannot identify any aggravating or relieving factors.  Of note patient is obviously intoxicated with alcohol  and smells of alcohol .  Patient poorly compliant with interview with this provider, only repeatedly saying just call Dr. Luke.  Per chart review NP Luke Miyamoto is patient's primary care doctor.  Low 5K to 2 alcohol  tox occasion.   HPI     Prior to Admission medications   Medication Sig Start Date End Date Taking? Authorizing Provider  albuterol  (PROVENTIL  HFA;VENTOLIN  HFA) 108 (90 Base) MCG/ACT inhaler Inhale 2 puffs into the lungs every 4 (four) hours as needed for wheezing or shortness of breath. 05/05/18   [place], Theodoro FALCON, MD  albuterol  (PROVENTIL ) (2.5 MG/3ML) 0.083% nebulizer solution Take 3 mLs (2.5 mg total) by nebulization every 6 (six) hours as needed for wheezing or shortness of breath. 02/24/18   [place], Theodoro FALCON, MD  amoxicillin  (AMOXIL ) 500 MG capsule Take 500 mg by mouth 3 (three) times daily. 04/22/22   [provider]  ANORO ELLIPTA  62.5-25 MCG/ACT AEPB 1 puff daily. 03/26/22   [provider]  celecoxib  (CELEBREX ) 200 MG capsule Take 1 capsule (200 mg total) by mouth 2 (two) times daily. 03/26/22   Patti Rosina SAUNDERS, PA-C  docusate sodium  (COLACE) 100 MG capsule Take 1 capsule (100 mg total) by mouth 2 (two) times daily. Patient not taking: Reported on 04/24/2022 03/26/22    Patti Rosina SAUNDERS, PA-C  fluticasone  furoate-vilanterol (BREO ELLIPTA ) 200-25 MCG/INH AEPB Inhale 1 puff into the lungs daily. Patient not taking: Reported on 04/24/2022 09/16/18   Ward, Ami Copes, PA-C  hydrochlorothiazide  (MICROZIDE ) 12.5 MG capsule Take 1 capsule (12.5 mg total) by mouth daily. Patient not taking: Reported on 04/24/2022 05/05/18   Bari Theodoro FALCON, MD  HYDROcodone -acetaminophen  (NORCO/VICODIN) 5-325 MG tablet Take 1-2 tablets by mouth every 6 (six) hours as needed. 04/09/22   [provider]  lisinopril  (PRINIVIL ,ZESTRIL ) 40 MG tablet Take 1 tablet (40 mg total) by mouth daily. Patient not taking: Reported on 03/12/2022 05/05/18   Bari Theodoro FALCON, MD  losartan  (COZAAR ) 50 MG tablet Take 50 mg by mouth daily.    [provider]  methocarbamol  (ROBAXIN ) 500 MG tablet Take 1 tablet (500 mg total) by mouth every 6 (six) hours as needed for muscle spasms. Patient not taking: Reported on 04/24/2022 03/26/22   Patti Rosina SAUNDERS, PA-C  metoprolol  tartrate (LOPRESSOR ) 100 MG tablet Take 1 tablet (100 mg total) by mouth once for 1 dose. 2 HOURS PRIOR TO PROCEDURE Patient not taking: Reported on 03/12/2022 09/18/21 09/18/21  Jeffrie Oneil BROCKS, MD  polyethylene glycol (MIRALAX  / GLYCOLAX ) 17 g packet Take 17 g by mouth daily as needed for mild constipation. Patient not taking: Reported on 04/24/2022 03/26/22   Patti Rosina SAUNDERS, PA-C  rosuvastatin  (CRESTOR ) 20 MG tablet Take 1 tablet (20 mg total) by mouth daily. 09/18/21 04/24/22  Jeffrie Oneil BROCKS, MD  Allergies: Patient has no known allergies.    Review of Systems  Cardiovascular:  Positive for chest pain.    Updated Vital Signs BP (!) 188/107   Pulse 84   Temp 97.8 F (36.6 C) (Oral)   Resp 12   Ht 5' 8 (1.727 m)   Wt 88.5 kg   SpO2 98%   BMI 29.67 kg/m   Physical Exam Vitals and nursing note reviewed.  Constitutional:      Appearance: He is not ill-appearing or toxic-appearing.  HENT:     Head: Normocephalic  and atraumatic.     Mouth/Throat:     Mouth: Mucous membranes are moist.     Pharynx: No oropharyngeal exudate or posterior oropharyngeal erythema.  Eyes:     General:        Right eye: No discharge.        Left eye: No discharge.     Conjunctiva/sclera: Conjunctivae normal.  Cardiovascular:     Rate and Rhythm: Normal rate and regular rhythm.     Pulses: Normal pulses.     Heart sounds: Normal heart sounds. No murmur heard. Pulmonary:     Effort: Pulmonary effort is normal. No respiratory distress.     Breath sounds: Normal breath sounds. No wheezing or rales.  Chest:     Chest wall: No mass, tenderness or edema.  Abdominal:     General: Bowel sounds are normal. There is no distension.     Palpations: Abdomen is soft.     Tenderness: There is no abdominal tenderness.  Musculoskeletal:        General: No deformity.       Arms:     Cervical back: Neck supple.     Right lower leg: No edema.     Left lower leg: No edema.  Skin:    General: Skin is warm and dry.     Capillary Refill: Capillary refill takes less than 2 seconds.  Neurological:     Mental Status: He is alert and oriented to person, place, and time.     Comments: Intoxicated with alcohol .  Psychiatric:        Mood and Affect: Mood normal.     (all labs ordered are listed, but only abnormal results are displayed) Labs Reviewed  COMPREHENSIVE METABOLIC PANEL WITH GFR - Abnormal; Notable for the following components:      Result Value   CO2 20 (*)    Anion gap 16 (*)    All other components within normal limits  ETHANOL - Abnormal; Notable for the following components:   Alcohol , Ethyl (B) 243 (*)    All other components within normal limits  CBC WITH DIFFERENTIAL/PLATELET  RAPID URINE DRUG SCREEN, HOSP PERFORMED  TROPONIN I (HIGH SENSITIVITY)  TROPONIN I (HIGH SENSITIVITY)    EKG: EKG Interpretation Date/Time:  Friday September 30 2024 21:47:26 EST Ventricular Rate:  81 PR Interval:  145 QRS  Duration:  93 QT Interval:  418 QTC Calculation: 486 R Axis:   1  Text Interpretation: Sinus rhythm Minimal ST depression, inferior leads Borderline prolonged QT interval No significant change since last tracing Confirmed by Patt Alm DEL [phone]) on 09/30/2024 10:23:08 PM  Radiology: DG Chest Portable 1 View Result Date: 09/30/2024 CLINICAL DATA:  Chest pain EXAM: PORTABLE CHEST 1 VIEW COMPARISON:  09/16/2018 FINDINGS: Hypoventilatory change. Borderline cardiomegaly. No acute airspace disease, pleural effusion or pneumothorax. IMPRESSION: No active disease. Borderline cardiomegaly. Electronically Signed   By: Luke Scott HERO.D.  On: 09/30/2024 22:56     Procedures   Medications Ordered in the ED  albuterol  (VENTOLIN  HFA) 108 (90 Base) MCG/ACT inhaler 2 puff (2 puffs Inhalation Given 09/30/24 2302)                                    Medical Decision Making 63 year old male presents with concern for chest pain.  Vital signs reassuring on intake, cardiopulmonary unremarkable, abdominal seems benign.  Patient clinically intoxicated with alcohol .  No evidence of trauma.  DDx includes but limited to ACS, PE, pleural effusion, pneumonia, pneumothorax, dysrhythmia, GERD, MSK pain.  Amount and/or Complexity of Data Reviewed Labs: ordered.    Details: CBC unremarkable, CMP with mildly low CO2 to 20 and elevated anion gap of 16.  UDS negative, EtOH 243.  Troponins negative x 2 Radiology: ordered.    Details: Chest x-ray unremarkable  Risk Prescription drug management.  Of note while patient was in the emergency department while enforcement officers lowered the rails on his bed and assisted him to the restroom.  When he returned to the bed they did not raise the rails again, though ED staff had been diligent about keeping the patient contained given his intoxication.  Unfortunately patient did roll from the bed following this excursion to the bathroom.  On time my evaluation, no signs of  traumatic injury, bed was lowered completely to the ground prior to fall, does not meet CT imaging criteria per [place] head CT rules.  Clinical concern for injury that would warrant further evaluation in the ED as below. Clinical concern for emergent cardiac etiology of the patient's episode (ED workup and patient management exceedingly low.  Also remains intoxicated therefore did not explicitly voiced understanding of clinical evaluation today, however we will document in his discharge summary.  He was discharged in stable condition. Recommend OP follow up with his PCP.   This chart was dictated using voice recognition software, Dragon. Despite the best efforts of this provider to proofread and correct errors, errors may still occur which can change documentation meaning.      Final diagnoses:  Alcoholic intoxication without complication  Chest pain, unspecified type    ED Discharge Orders     None          Bobette Pleasant JONELLE DEVONNA 10/01/24 0248    Patt Alm Macho, MD 10/01/24 1517

## 2024-10-01 DIAGNOSIS — R0789 Other chest pain: Secondary | ICD-10-CM | POA: Diagnosis not present

## 2024-10-01 LAB — RAPID URINE DRUG SCREEN, HOSP PERFORMED
Amphetamines: NOT DETECTED
Barbiturates: NOT DETECTED
Benzodiazepines: NOT DETECTED
Cocaine: NOT DETECTED
Opiates: NOT DETECTED
Tetrahydrocannabinol: NOT DETECTED

## 2024-10-01 LAB — TROPONIN I (HIGH SENSITIVITY): Troponin I (High Sensitivity): 10 ng/L (ref <18)

## 2024-10-01 NOTE — ED Notes (Signed)
 NT to nurses station, states pt is on the ground. Per [employer] at bedside, pt intentionally rolled off the side of the bed. Pt w.o injuries at this time, did not hit head. MD made aware.

## 2024-10-01 NOTE — ED Notes (Signed)
 Pt rolling back and forth on stretcher, not following commands to lay still for vital sign obtainment. MD at bedside

## 2024-10-01 NOTE — ED Notes (Addendum)
 Pt ambulatory to BR w/ standby assist. Safely returned to stretcher w/ [employer] standby assist.

## 2024-10-01 NOTE — Discharge Instructions (Signed)
 David Curtis was seen in the ER today for his chest pain.  There is no evidence of emergent problem with his heart this evening.  He is intoxicated from alcohol .  He did have a fall from his bed in the emergency department but there is no signs of traumatic injury on his physical exam and his vital signs remained normal.  Follow-up with his primary care doctor and return to the ER with any new severe symptoms.

## 2024-10-01 NOTE — ED Notes (Addendum)
 Pt provided w/ d/c and f/u instructions, provide with paperwork. Pt out of ED w/ all belongings in [employer] custody. VSS, LDA removed

## 2024-10-03 ENCOUNTER — Other Ambulatory Visit: Payer: Self-pay

## 2024-10-03 ENCOUNTER — Emergency Department (HOSPITAL_COMMUNITY)
Admission: EM | Admit: 2024-10-03 | Discharge: 2024-10-03 | Disposition: A | Discharge status: Home / Self Care | Attending: Emergency Medicine | Admitting: Emergency Medicine

## 2024-10-03 ENCOUNTER — Emergency Department (HOSPITAL_COMMUNITY)

## 2024-10-03 DIAGNOSIS — R1110 Vomiting, unspecified: Secondary | ICD-10-CM | POA: Insufficient documentation

## 2024-10-03 DIAGNOSIS — R079 Chest pain, unspecified: Secondary | ICD-10-CM | POA: Diagnosis present

## 2024-10-03 DIAGNOSIS — G8929 Other chronic pain: Secondary | ICD-10-CM | POA: Diagnosis not present

## 2024-10-03 DIAGNOSIS — R059 Cough, unspecified: Secondary | ICD-10-CM | POA: Diagnosis not present

## 2024-10-03 DIAGNOSIS — M79606 Pain in leg, unspecified: Secondary | ICD-10-CM | POA: Diagnosis not present

## 2024-10-03 DIAGNOSIS — Z7951 Long term (current) use of inhaled steroids: Secondary | ICD-10-CM | POA: Insufficient documentation

## 2024-10-03 DIAGNOSIS — F17210 Nicotine dependence, cigarettes, uncomplicated: Secondary | ICD-10-CM | POA: Insufficient documentation

## 2024-10-03 DIAGNOSIS — R072 Precordial pain: Secondary | ICD-10-CM | POA: Diagnosis not present

## 2024-10-03 DIAGNOSIS — R509 Fever, unspecified: Secondary | ICD-10-CM | POA: Diagnosis not present

## 2024-10-03 DIAGNOSIS — I2510 Atherosclerotic heart disease of native coronary artery without angina pectoris: Secondary | ICD-10-CM | POA: Diagnosis not present

## 2024-10-03 DIAGNOSIS — Z79899 Other long term (current) drug therapy: Secondary | ICD-10-CM | POA: Diagnosis not present

## 2024-10-03 DIAGNOSIS — I10 Essential (primary) hypertension: Secondary | ICD-10-CM | POA: Diagnosis not present

## 2024-10-03 DIAGNOSIS — J449 Chronic obstructive pulmonary disease, unspecified: Secondary | ICD-10-CM | POA: Insufficient documentation

## 2024-10-03 LAB — BASIC METABOLIC PANEL WITH GFR
Anion gap: 12 (ref 5–15)
BUN: 7 mg/dL — ABNORMAL LOW (ref 8–23)
CO2: 24 mmol/L (ref 22–32)
Calcium: 9.7 mg/dL (ref 8.9–10.3)
Chloride: 102 mmol/L (ref 98–111)
Creatinine, Ser: 0.98 mg/dL (ref 0.61–1.24)
GFR, Estimated: >60 mL/min (ref >60)
Glucose, Bld: 130 mg/dL — ABNORMAL HIGH (ref 70–99)
Potassium: 3.6 mmol/L (ref 3.5–5.1)
Sodium: 138 mmol/L (ref 135–145)

## 2024-10-03 LAB — CBC
HCT: 45.9 % (ref 39.0–52.0)
Hemoglobin: 15.4 g/dL (ref 13.0–17.0)
MCH: 31.2 pg (ref 26.0–34.0)
MCHC: 33.6 g/dL (ref 30.0–36.0)
MCV: 93.1 fL (ref 80.0–100.0)
Platelets: 288 K/uL (ref 150–400)
RBC: 4.93 MIL/uL (ref 4.22–5.81)
RDW: 12.8 % (ref 11.5–15.5)
WBC: 4.8 K/uL (ref 4.0–10.5)
nRBC: 0 % (ref 0.0–0.2)

## 2024-10-03 LAB — TROPONIN I (HIGH SENSITIVITY): Troponin I (High Sensitivity): 12 ng/L (ref <18)

## 2024-10-03 NOTE — ED Notes (Signed)
 Patient transported to X-ray

## 2024-10-03 NOTE — ED Triage Notes (Signed)
 Patient states he was here on Fri for the same chest pain and high blood pressure, states he has been in jail and was released at 0300am today , walked to ED from jail. C/o chest pain and high blood pressure

## 2024-10-03 NOTE — ED Provider Notes (Signed)
 [place] EMERGENCY DEPARTMENT AT [facility] Provider Note   CSN: [id number] Arrival date & time: 10/03/24  9565     Patient presents with: Chest Pain   David Curtis is a 63 y.o. male.   The history is provided by the patient.  Patient with history of CAD, hypertension, ongoing tobacco use presents with chest pain.  Patient reports he has had chest pain for several weeks.  He also reports his blood pressures have been elevated.  He also mentions multiple other complaints including fevers and vomiting, cough, chronic leg pain, as well as abdominal discomfort. Patient had been in jail was just released and walked to the ER from jail. He reports he continues to smoke.  He reports he has all of his home medications Patient is unable to report what makes the chest pain worse.  The pain has been present on most days of the week   Past Medical History:  Diagnosis Date   Alcohol  dependence (HCC)    Aortic atherosclerosis    Arthritis    CAD (coronary artery disease)    Cigarette nicotine dependence    Cocaine use    COPD (chronic obstructive pulmonary disease) (HCC)    GERD (gastroesophageal reflux disease)    Hemorrhoids    Hypertension    Lipoma    Liver nodule    OA (osteoarthritis)    Overweight    Pancreatic cyst    Pneumonia    Prediabetes    Pulmonary nodules     Prior to Admission medications   Medication Sig Start Date End Date Taking? Authorizing Provider  albuterol  (PROVENTIL  HFA;VENTOLIN  HFA) 108 (90 Base) MCG/ACT inhaler Inhale 2 puffs into the lungs every 4 (four) hours as needed for wheezing or shortness of breath. 05/05/18   [place], Theodoro FALCON, MD  albuterol  (PROVENTIL ) (2.5 MG/3ML) 0.083% nebulizer solution Take 3 mLs (2.5 mg total) by nebulization every 6 (six) hours as needed for wheezing or shortness of breath. 02/24/18   [place], Theodoro FALCON, MD  amoxicillin  (AMOXIL ) 500 MG capsule Take 500 mg by mouth 3 (three) times daily. 04/22/22   [provider]  ANORO ELLIPTA  62.5-25 MCG/ACT AEPB 1 puff daily. 03/26/22   [provider]  celecoxib  (CELEBREX ) 200 MG capsule Take 1 capsule (200 mg total) by mouth 2 (two) times daily. 03/26/22   Patti Rosina SAUNDERS, PA-C  docusate sodium  (COLACE) 100 MG capsule Take 1 capsule (100 mg total) by mouth 2 (two) times daily. Patient not taking: Reported on 04/24/2022 03/26/22   Patti Rosina SAUNDERS, PA-C  fluticasone  furoate-vilanterol (BREO ELLIPTA ) 200-25 MCG/INH AEPB Inhale 1 puff into the lungs daily. Patient not taking: Reported on 04/24/2022 09/16/18   Ward, Ami Copes, PA-C  hydrochlorothiazide  (MICROZIDE ) 12.5 MG capsule Take 1 capsule (12.5 mg total) by mouth daily. Patient not taking: Reported on 04/24/2022 05/05/18   Bari Theodoro FALCON, MD  HYDROcodone -acetaminophen  (NORCO/VICODIN) 5-325 MG tablet Take 1-2 tablets by mouth every 6 (six) hours as needed. 04/09/22   [provider]  lisinopril  (PRINIVIL ,ZESTRIL ) 40 MG tablet Take 1 tablet (40 mg total) by mouth daily. Patient not taking: Reported on 03/12/2022 05/05/18   Bari Theodoro FALCON, MD  losartan  (COZAAR ) 50 MG tablet Take 50 mg by mouth daily.    [provider]  methocarbamol  (ROBAXIN ) 500 MG tablet Take 1 tablet (500 mg total) by mouth every 6 (six) hours as needed for muscle spasms. Patient not taking: Reported on 04/24/2022 03/26/22   Patti Rosina SAUNDERS,  PA-C  metoprolol  tartrate (LOPRESSOR ) 100 MG tablet Take 1 tablet (100 mg total) by mouth once for 1 dose. 2 HOURS PRIOR TO PROCEDURE Patient not taking: Reported on 03/12/2022 09/18/21 09/18/21  Jeffrie Oneil BROCKS, MD  polyethylene glycol (MIRALAX  / GLYCOLAX ) 17 g packet Take 17 g by mouth daily as needed for mild constipation. Patient not taking: Reported on 04/24/2022 03/26/22   Patti Rosina SAUNDERS, PA-C  rosuvastatin  (CRESTOR ) 20 MG tablet Take 1 tablet (20 mg total) by mouth daily. 09/18/21 04/24/22  Jeffrie Oneil BROCKS, MD    Allergies: Patient has no known allergies.    Review of  Systems  Constitutional:  Positive for fever.  Respiratory:  Positive for cough and shortness of breath.   Cardiovascular:  Positive for chest pain.    Updated Vital Signs BP (!) 155/85   Pulse 85   Temp (!) 97.5 F (36.4 C)   Resp 16   Ht 1.727 m (5' 8)   Wt 85.7 kg   SpO2 100%   BMI 28.74 kg/m   Physical Exam CONSTITUTIONAL: Well developed/well nourished, resting comfortably HEAD: Normocephalic/atraumatic EYES: EOMI/PERRL ENMT: Mucous membranes moist NECK: supple no meningeal signs CV: S1/S2 noted, no murmurs/rubs/gallops noted LUNGS: Decreased breath sounds bilaterally, no apparent distress ABDOMEN: soft, nontender NEURO: Pt is awake/alert/appropriate, moves all extremitiesx4.  No facial droop.   EXTREMITIES: pulses normal/equalx4, full ROM, no lower extremity edema SKIN: warm, color normal PSYCH: no abnormalities of mood noted, alert and oriented to situation  (all labs ordered are listed, but only abnormal results are displayed) Labs Reviewed  BASIC METABOLIC PANEL WITH GFR - Abnormal; Notable for the following components:      Result Value   Glucose, Bld 130 (*)    BUN 7 (*)    All other components within normal limits  CBC  TROPONIN I (HIGH SENSITIVITY)    EKG: EKG Interpretation Date/Time:  Monday October 03 2024 04:57:42 EST Ventricular Rate:  92 PR Interval:  134 QRS Duration:  92 QT Interval:  384 QTC Calculation: 474 R Axis:   20  Text Interpretation: Normal sinus rhythm Nonspecific ST and T wave abnormality Abnormal ECG No significant change since last tracing Confirmed by Midge Golas ([id number]) on 10/03/2024 5:01:49 AM  Radiology: [facility] Chest 2 View Result Date: 10/03/2024 CLINICAL DATA:  Chest pain EXAM: CHEST - 2 VIEW COMPARISON:  09/30/2024 FINDINGS: The lungs are clear without focal pneumonia, edema, pneumothorax or pleural effusion. The cardiopericardial silhouette is within normal limits for size. No acute bony abnormality. Interstitial  markings are diffusely coarsened with chronic features. IMPRESSION: Chronic interstitial coarsening without acute cardiopulmonary findings. Electronically Signed   By: Camellia Candle M.D.   On: 10/03/2024 06:00     Procedures   Medications Ordered in the ED - No data to display                                  Medical Decision Making Amount and/or Complexity of Data Reviewed Labs: ordered. Radiology: ordered.   This patient presents to the ED for concern of chest pain, this involves an extensive number of treatment options, and is a complaint that carries with it a high risk of complications and morbidity.  The differential diagnosis includes but is not limited to acute coronary syndrome, aortic dissection, pulmonary embolism, pericarditis, pneumothorax, pneumonia, myocarditis, pleurisy, esophageal rupture   Comorbidities that complicate the patient evaluation: Patient's presentation is complicated by  their history of CAD and hypertension  Social Determinants of Health: Patient's tobacco use, recent incarceration  increases the complexity of managing their presentation  Additional history obtained: Records reviewed cardiology records reviewed  Lab Tests: I Ordered, and personally interpreted labs.  The pertinent results include: Labs overall unremarkable  Imaging Studies ordered: I ordered imaging studies including X-ray chest  I independently visualized and interpreted imaging which showed no acute findings I agree with the radiologist interpretation  Test Considered: Given his ongoing chest pain for several weeks, will defer cardiac admission at this time.  No tachycardia or hypoxia to suggest PE  Complexity of problems addressed: Patient's presentation is most consistent with  acute presentation with potential threat to life or bodily function  Disposition: After consideration of the diagnostic results and the patient's response to treatment,  I feel that the patent  would benefit from discharge  .    6:32 AM Patient with known history of CAD and hypertension presents with chest pain.  He reports has had ongoing chest pain for weeks.  He was just seen in the ER on November 7 for chest pain but at that time was intoxicated and was on his way to jail  Patient also has multiple other complaints including chronic leg pain, cough but overall is well-appearing.  Given his appearance, initial workup admission is not warranted at this time but given his underlying risk factors we will place outpatient referral to cardiology   Counseled patient on stopping smoking     Final diagnoses:  Precordial pain    ED Discharge Orders          Ordered    Ambulatory referral to Cardiology        10/03/24 0624               Midge Golas, MD 10/03/24 [phone]

## 2024-12-07 NOTE — Progress Notes (Unsigned)
" °  Cardiology Office Note:  .   Date:  12/07/2024  ID:  David Curtis, DOB 1960-12-24, MRN [id number] PCP: Leontine Cramp, NP  [facility] Providers Cardiologist:  Oneil Parchment, MD {  History of Present Illness: .   David Curtis is a 64 y.o. male with history of nonobstructive CAD by CCTA 10/2021, alcohol  abuse, nicotine dependence.     CAD 10/2021 moderate nonobstructive CAD in the proximal LAD.  CAC score 511.  Negative FFR in the distal LAD.  Social history      Patient has been seen 3 times since November 2025.  2 times for alcohol  intoxication and brought in by law enforcement.  In November reported precordial chest pain along with multiple other complaints including fever, vomiting, cough, leg pain, abdominal discomfort.  Reportedly just released from jail and walked in from the jail.  Troponins negative.  ROS: Denies: Chest pain, shortness of breath, orthopnea, peripheral edema, palpitations, syncope, decreased exercise capacity, fatigue, dizziness.   Studies Reviewed: .         Risk Assessment/Calculations:   {Does this patient have ATRIAL FIBRILLATION?:[id number]} No BP recorded.  {Refresh Note OR Click here to enter BP  :1}***       Physical Exam:   VS:  There were no vitals taken for this visit.   Wt Readings from Last 3 Encounters:  10/03/24 189 lb (85.7 kg)  09/30/24 195 lb 1.7 oz (88.5 kg)  03/25/22 195 lb (88.5 kg)    GEN: Well nourished, well developed in no acute distress NECK: No JVD; No carotid bruits CARDIAC: ***RRR, no murmurs, rubs, gallops RESPIRATORY:  Clear to auscultation without rales, wheezing or rhonchi  ABDOMEN: Soft, non-tender, non-distended EXTREMITIES:  No edema; No deformity   ASSESSMENT AND PLAN: .         {Are you ordering a CV Procedure (e.g. stress test, cath, DCCV, TEE, etc)?   Press F2        :[id number]}  Dispo: ***  Signed, Thom LITTIE Sluder, PA-C  "

## 2024-12-08 ENCOUNTER — Ambulatory Visit: Attending: Cardiology | Admitting: Cardiology
